# Patient Record
Sex: Male | Born: 1955 | Race: White | Hispanic: No | Marital: Married | Smoking: Never smoker
Health system: Southern US, Community
[De-identification: ages and names within clinical notes are randomized; demographics above are authoritative.]

## PROBLEM LIST (undated history)

## (undated) DIAGNOSIS — E669 Obesity, unspecified: Secondary | ICD-10-CM

## (undated) DIAGNOSIS — I10 Essential (primary) hypertension: Secondary | ICD-10-CM

## (undated) DIAGNOSIS — M1990 Unspecified osteoarthritis, unspecified site: Secondary | ICD-10-CM

## (undated) HISTORY — DX: Unspecified osteoarthritis, unspecified site: M19.90

## (undated) HISTORY — DX: Obesity, unspecified: E66.9

## (undated) HISTORY — DX: Essential (primary) hypertension: I10

---

## 2000-12-06 ENCOUNTER — Encounter: Payer: Self-pay | Admitting: Internal Medicine

## 2000-12-06 ENCOUNTER — Encounter: Admission: RE | Admit: 2000-12-06 | Discharge: 2000-12-06 | Payer: Self-pay | Admitting: Internal Medicine

## 2008-03-12 ENCOUNTER — Ambulatory Visit (HOSPITAL_COMMUNITY): Admission: RE | Admit: 2008-03-12 | Discharge: 2008-03-13 | Payer: Self-pay | Admitting: Orthopedic Surgery

## 2008-03-28 ENCOUNTER — Ambulatory Visit (HOSPITAL_COMMUNITY): Admission: RE | Admit: 2008-03-28 | Discharge: 2008-03-29 | Payer: Self-pay | Admitting: Orthopedic Surgery

## 2008-09-25 ENCOUNTER — Encounter: Admission: RE | Admit: 2008-09-25 | Discharge: 2008-09-25 | Payer: Self-pay | Admitting: Orthopedic Surgery

## 2008-10-01 ENCOUNTER — Inpatient Hospital Stay (HOSPITAL_COMMUNITY): Admission: RE | Admit: 2008-10-01 | Discharge: 2008-10-02 | Payer: Self-pay | Admitting: Orthopedic Surgery

## 2010-09-01 ENCOUNTER — Encounter: Payer: Self-pay | Admitting: Orthopedic Surgery

## 2010-11-25 LAB — CBC
HCT: 29.2 % — ABNORMAL LOW (ref 39.0–52.0)
HCT: 32.3 % — ABNORMAL LOW (ref 39.0–52.0)
Hemoglobin: 10.7 g/dL — ABNORMAL LOW (ref 13.0–17.0)
Hemoglobin: 9.7 g/dL — ABNORMAL LOW (ref 13.0–17.0)
MCHC: 33.3 g/dL (ref 30.0–36.0)
MCHC: 33.3 g/dL (ref 30.0–36.0)
MCV: 79.6 fL (ref 78.0–100.0)
MCV: 82.6 fL (ref 78.0–100.0)
Platelets: 234 10*3/uL (ref 150–400)
Platelets: 314 10*3/uL (ref 150–400)
RBC: 3.53 MIL/uL — ABNORMAL LOW (ref 4.22–5.81)
RBC: 4.06 MIL/uL — ABNORMAL LOW (ref 4.22–5.81)
RDW: 18.5 % — ABNORMAL HIGH (ref 11.5–15.5)
RDW: 18.7 % — ABNORMAL HIGH (ref 11.5–15.5)
WBC: 9 10*3/uL (ref 4.0–10.5)
WBC: 9.3 10*3/uL (ref 4.0–10.5)

## 2010-11-25 LAB — DIFFERENTIAL
Basophils Absolute: 0 10*3/uL (ref 0.0–0.1)
Basophils Relative: 0 % (ref 0–1)
Eosinophils Absolute: 0 10*3/uL (ref 0.0–0.7)
Eosinophils Relative: 0 % (ref 0–5)
Lymphocytes Relative: 23 % (ref 12–46)
Lymphs Abs: 2.1 10*3/uL (ref 0.7–4.0)
Monocytes Absolute: 0.3 10*3/uL (ref 0.1–1.0)
Monocytes Relative: 4 % (ref 3–12)
Neutro Abs: 6.8 10*3/uL (ref 1.7–7.7)
Neutrophils Relative %: 74 % (ref 43–77)

## 2010-11-25 LAB — PROTIME-INR
INR: 1 (ref 0.00–1.49)
Prothrombin Time: 13.9 s (ref 11.6–15.2)

## 2010-11-25 LAB — TYPE AND SCREEN
ABO/RH(D): A POS
Antibody Screen: NEGATIVE

## 2010-11-25 LAB — BASIC METABOLIC PANEL
BUN: 19 mg/dL (ref 6–23)
Chloride: 101 mEq/L (ref 96–112)
GFR calc Af Amer: >60 mL/min (ref >60)
GFR calc non Af Amer: >60 mL/min (ref >60)
Potassium: 4.6 mEq/L (ref 3.5–5.1)
Sodium: 135 mEq/L (ref 135–145)

## 2010-11-25 LAB — BASIC METABOLIC PANEL WITH GFR
BUN: 14 mg/dL (ref 6–23)
CO2: 28 meq/L (ref 19–32)
Calcium: 8.3 mg/dL — ABNORMAL LOW (ref 8.4–10.5)
Chloride: 101 meq/L (ref 96–112)
Creatinine, Ser: 0.78 mg/dL (ref 0.4–1.5)
GFR calc non Af Amer: >60 mL/min
Glucose, Bld: 103 mg/dL — ABNORMAL HIGH (ref 70–99)
Potassium: 4.1 meq/L (ref 3.5–5.1)
Sodium: 135 meq/L (ref 135–145)

## 2010-11-25 LAB — URINALYSIS, ROUTINE W REFLEX MICROSCOPIC
Glucose, UA: NEGATIVE mg/dL
Nitrite: NEGATIVE
Specific Gravity, Urine: 1.009 (ref 1.005–1.030)
pH: 6 (ref 5.0–8.0)

## 2010-11-25 LAB — GRAM STAIN

## 2010-12-23 NOTE — Op Note (Signed)
NAME:  Roy Lee, Roy Lee NO.:  [id number]   MEDICAL RECORD NO.:  [id number]          PATIENT TYPE:  OIB   LOCATION:  1615                         FACILITY:  [facility]   PHYSICIAN:  Madlyn Frankel. Charlann Boxer, M.D.  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   FINDINGS:  The patient was noted to have a potential space in the  lateral thigh, superficial to his deep fascia, iliotibial band and  gluteal fascia within the subcutaneous fat region.   There was no obvious purulence.  Some necrotic tissue present.   PROCEDURE:  Left hip incision and drainage with debridement and  irrigation utilizing 6 L of normal saline solution, 500 mL of bacitracin-  laden fluid.   SURGEON:  Madlyn Frankel. Charlann Boxer, M.D.   ASSISTANT:  Surgical tech.   ANESTHESIA:  Duramorph spinal.   COMPLICATIONS:  None.   DRAINS:  Times one.   INDICATION FOR THE PROCEDURE:  Mr. Arney is a 55 year old patient of mine  with a history of a total hip replacement now 16 days out, who presented  to the office on Monday with some new onset drainage just day the day  before.  No fevers, chills, night sweats.   Despite an attempt to drain a lot of this hematoma out in the office, he  came back to the office was some recurrent drainage.  I discussed with  him that if it had persistent drainage I was going to take him to the  operating room and I and D it.  Risks and benefits were discussed.  The  plan was reviewed.   PROCEDURE IN DETAIL:  The patient was brought to the operative theater.  Once adequate anesthesia was established ,the patient was positioned in  the right lateral decubitus position with  the left side up.  We  prescrubbed and prepped and draped the lateral thigh.   Antibiotics were administered, 2 grams of Ancef.  I incised the skin.  There is still a large collection of fluid inside the knee, but it did  not erupt out of the wound.  There was no evidence of any obvious  purulence.  A bit of a necrotic hue to some of the tissue fluid, but  nothing that appeared to be obviously infected.   Following the debridement and suction out of this remaining fluid, I  palpated around and felt that it extended basically under the area of  incision.  There was no other sinus tracts or undermining of the tissue.  Given these findings, I went ahead and irrigated with normal saline  solution, pulse lavaged a total of 9 L of normal saline solution.  Three  3 L bags were utilized.   Following this 500 mL of bacitracin-laden fluid were placed in the wound  and  sloshed around.   A medium Hemovac drain was placed into this cavity.  I reapproximated  with just probably four 2-0 Vicryls in the subcu layer and then used 2-0  nylon on the skin.   We cleaned the skin edges, utilized Xeroform gauze and then dressed the  wound sterilely.  He is brought to recovery room in stable condition,  tolerating the procedure well under Duramorph spinal, without  complication.      Madlyn Frankel Charlann Boxer, M.D.  Electronically Signed     MDO/MEDQ  D:  03/28/2008  T:  03/28/2008  Job:  [id number]

## 2010-12-23 NOTE — H&P (Signed)
NAME:  Roy Lee, Roy Lee NO.:  [id number]   MEDICAL RECORD NO.:  [id number]          PATIENT TYPE:  OIB   LOCATION:                               FACILITY:  [facility]   PHYSICIAN:  Madlyn Frankel. Charlann Boxer, M.D.  DATE OF BIRTH:  1955-11-14   DATE OF ADMISSION:  03/28/2008  DATE OF DISCHARGE:                              HISTORY & PHYSICAL   REASON FOR ADMISSION:  Hematoma of the left hip.   CHIEF COMPLAINT:  Left hip wound drainage.   HISTORY OF PRESENT ILLNESS:  A 55 year old male with a history of left  total hip replacement on March 12, 2008.  Since then he has been doing  well until this past Saturday he developed some persistent wound  drainage.  He was seen in our office on the 17th where we drained a  significant amount of a hematoma from his wound.  Since then he has  continued to have drainage.  He reports no fevers or chills with no  increasing pain in his left hip.   PAST MEDICAL HISTORY:  1. Osteoarthritis.  2. Hypertension.   PAST SURGICAL HISTORY:  1. Left total hip replacement.  2. Left ankle surgery.  3. Tonsillectomy.   FAMILY HISTORY:  CHF and cancer.   SOCIAL HISTORY:  Nonsmoker.  Wife is a physical therapist.   DRUG ALLERGIES:  No known drug allergies.   MEDICATIONS:  1. Lisinopril 40 mg 1 p.o. daily.  2. Celebrex 200 mg 1 p.o. daily.  3. Fish oil daily.  4. Glucosamine daily.  5. Vicodin 7.5/325 one to two p.o. q.4-6 p.r.n. pain.  6. Robaxin 500 mg p.o. q.6 p.r.n. muscle spasm pain.  7. Iron 325 mg 1 p.o. t.i.d.  The patient may use his own iron.  8. Keflex 500 mg 1 p.o. q.i.d.  He is on day #3 of #7 today.   REVIEW OF SYSTEMS:  See HPI.   PHYSICAL EXAMINATION:  Temp 99.1, pulse 72, respirations 16, blood  pressure 118/80.  GENERAL:  Awake, alert, and oriented.  NECK:  Supple.  No carotid bruits.  CHEST:  Lungs are clear to auscultation bilaterally.  BREASTS:  Deferred.  HEART:  Regular rate and rhythm.  S1, S2 distinct.  ABDOMEN:   Soft, nontender, bowel sounds present.  GENITOURINARY:  Deferred.  EXTREMITIES:  Left hip range of motion has minimal discomfort.  SKIN:  Persistent drainage of a hematoma of left hip wound.  NEUROLOGIC:  He has intact distal sensibilities.   LABORATORIES:  Labs pending admission, none ordered initially.   IMPRESSION:  Left hip wound hematoma.   PLAN OF ACTION:  A washout irrigation of his left hip wound due to  hematoma.  Risks and complications were discussed with Dr. Charlann Boxer.  He  will be admitted only as observation patient.     ______________________________  Yetta Glassman. [address], [place]      Madlyn Frankel. Charlann Boxer, M.D.  Electronically Signed    BLM/MEDQ  D:  03/28/2008  T:  03/28/2008  Job:  [id number]

## 2010-12-23 NOTE — H&P (Signed)
NAMEDEAVIN, FORST NO.:  [id number]   MEDICAL RECORD NO.:  [id number]        PATIENT TYPE:  LINP   LOCATION:                               FACILITY:  [facility]   PHYSICIAN:  Madlyn Frankel. Charlann Boxer, M.D.  DATE OF BIRTH:  05-Dec-1955   DATE OF ADMISSION:  03/12/2008  DATE OF DISCHARGE:                              HISTORY & PHYSICAL   PROCEDURE:  Left total hip arthroplasty.   CHIEF COMPLAINT:  Left hip pain.   HISTORY OF PRESENT ILLNESS:  A 55 year old male with a history of left  hip pain secondary to end-stage osteoarthritis of the hip joint.  This  has been refractory to all conservative treatment.  Has diminished  quality of life and difficulty ambulating.  He has been presurgical  assessed by Elvera Lennox, PAC.   PAST MEDICAL HISTORY:  1. Osteoarthritis.  2. Hypertension.   PAST SURGICAL HISTORY:  1. Left ankle surgery in 1980.  2. Tonsillectomy in 1965.   FAMILY HISTORY:  Cancer, congestive heart failure.   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  Primary caregiver will be his wife in the home is a physical therapist.   DRUG ALLERGIES:  No known drug allergies.   MEDICATIONS:  1. Lisinopril 40 mg one p.o. daily.  2. Celebrex 200 mg one p.o. daily.  3. Fish oil daily.  4. Glucosamine daily.   REVIEW OF SYSTEMS:  Cardiovascular: As last EKG in July and is attached  to chart.  MUSCULOSKELETAL:  Has joint pain, morning stiffness of left  hip.  Otherwise see HPI.   PHYSICAL EXAMINATION:  Pulse 72, respirations 16, blood pressure  152/108.  Height 5 feet, 10 inches, weight 295 pounds.  GENERAL:  Awake, alert and oriented, well-developed, well-nourished.  NECK: Supple.  No carotid bruits.  CHEST:  Lungs are clear to auscultation bilaterally.  BREASTS:  Deferred.  HEART:  Regular rate and rhythm.  S1-S2 distinct.  ABDOMEN:  Soft, nontender, bowel sounds present.  GENITOURINARY:  Deferred.  EXTREMITIES:  Left hip has  decreased range of motion, increased pain.  SKIN:  No cellulitis.  NEUROLOGIC:  Intact distal sensibilities.   LABORATORY DATA:  Labs, CBC showed hemoglobin 14.1, hematocrit 42.4,  white blood cell count of 7, platelets 220.  Coagulation showed his INR  to be 0.93.  PT 11.7.  Metabolic panel- his potassium was 5, sodium 139,  creatinine was 0.8, BUN 14, glucose 95.  Liver panel normal.  Total  cholesterol 163, triglycerides 70, LDL 79, HDL 69.  White cell count all  within normal limits.   EKG normal sinus rhythm.   Chest x-ray pending.  Presurgical testing depend upon necessity as  stated by anesthesia.   ASSESSMENT:  Left hip osteoarthritis.   PLAN OF ACTION:  Left total hip arthroplasty at [facility]  March 12, 2008, by surgeon Dr. Durene Romans.  Risks and complications  were discussed.   Some postoperative medications were provided including Robaxin, iron,  Colace and MiraLax.  The patient will try to utilize Coumadin for DVT  prophylaxis.  In addition, the patient would like to hopefully have just  a two day course in the hospital with discharge on postop day #2  provided he is hemodynamically and orthopedically stable.     ______________________________  Yetta Glassman. [address], [place]      Madlyn Frankel. Charlann Boxer, M.D.  Electronically Signed    BLM/MEDQ  D:  02/29/2008  T:  02/29/2008  Job:  [id number]

## 2010-12-23 NOTE — Op Note (Signed)
NAMEELIM, ECONOMOU NO.:  [id number]   MEDICAL RECORD NO.:  [id number]          PATIENT TYPE:  INP   LOCATION:  0005                         FACILITY:  [facility]   PHYSICIAN:  Madlyn Frankel. Charlann Boxer, M.D.  DATE OF BIRTH:  06/08/1956   DATE OF PROCEDURE:  03/12/2008  DATE OF DISCHARGE:                               OPERATIVE REPORT   PREOPERATIVE DIAGNOSIS:  Left hip end-stage degenerative joint disease.   POSTOPERATIVE DIAGNOSIS:  Left hip end-stage degenerative joint disease.   PROCEDURE:  Left total hip replacement.   COMPONENTS USED:  DePuy hip system, 54 ASR cup, a 7 high Tri-Lock stem  with a 47 ASR ball with +5 adapter.   SURGEON:  Madlyn Frankel. Charlann Boxer, M.D.   ASSISTANT:  Dwyane Luo.   ANESTHESIA:  Spinal.   BLOOD LOSS:  400.   DRAINS:  x1.   COMPLICATIONS:  None.   INDICATIONS FOR PROCEDURE:  Mr. Lizotte is a 55 year old male who presented  to the office over year ago with presentation of hip pain.  He was noted  at that time to have severe changes with already lower extremity  shortening perhaps due to contracture in addition to his collapse of the  loss of cartilage space.  He presented a year later with increasing  discomfort and inability to tolerate it.  Unfortunately, he was at a  point that he needed to go ahead and proceed with surgery.  We reviewed  the risks and benefits of the surgery in an attempt to try to get his  leg lengths back to normal.  We talked about the bearing surfaces and  his age.  The hope is with a metal-on-metal bearing surface  appropriately positioned then it would perhaps last him his life time.   Consent was obtained after reviewing the standard risks of infection,  DVT, component failure, need for revision, dislocation.   PROCEDURE IN DETAIL:  The patient was brought to the operative theater.  Once adequate anesthesia and preoperative antibiotics, Ancef 2 g, were  administered, the patient was positioned in the right lateral  decubitus  position left side up.  We prescrubbed the left lateral hip and prepped  and draped in a sterile fashion.  Lateral-based incision was made, and a  sharp dissection was carried to the iliotibial band and gluteal fascia.  This was sharply excised posteriorly for a posterior approach.  The  short external rotators were identified and taken down separate from the  posterior capsule.  The capsule was noticed to be significantly involved  and thus was excised, performing a capsulectomy.  I had actually  performed this at the end of case after I had reamed the acetabulum  where retractors were used to protect.  I used the capsule tissue to  protect the sciatic nerve from retractors.  Following the capsulotomy,  the hip was dislocated and noted to be severely arthritic.  A neck  osteotomy was made based off anatomic landmarks using a standard neck  length and small ball size to identify the center of the head.   Following the neck cut, I  attended to the femur first.  Using a starting  drill, I opened up the canal and irrigated to prevent fat emboli.  I  passed a single reamer once.  I began broaching with a size 1 and  carried this up to a size 6 which sat at the level of my neck cut.  I  used a calcar planer just to finish off from the anterior bone.   I used this time to debride some of the anterior capsule.  I then  removed the broach and placed a sponge to pack the femur off.  Femur was  now retracted anteriorly with retractors.  Retractors placed  posteriorly, again using the capsule to preserve and protect the sciatic  nerve.  With retractors placed, I removed the labrum as well as superior  capsule.  I began reaming with a 46 reamer and then a 48, 50 and 52 with  good bony bed preparation getting down close to the medial wall.   I marked anteriorly where I wished to have the cup positioned.  I  finished up with 53 reamer and then used 54 ASR cup.  It was impacted  with the  guide in place, indicating abduction about 40 degrees.  It was  positioned beneath the anterior wall anteriorly.  It sat at the level  where I had marked anteriorly, indicating that I would penetrate the  appropriate depth.   Please note that I did place some acetabular reamers with the medial  wall, and there was a small area over the foveal area just to fill in  the gap.   It was at this point that I removed the posterior capsule as there was  going to be no repair possible.  Attention was now directed to the trial  reduction.   Preoperatively, there was noted to be significant shortening of his left  lower extremity due to a combination of hip flexion contracture versus  cartilage disease.  This was taken into consideration when I had  positioned initially.  I did a trial reduction with 6 high offset neck  and a +2 adapter.  With this, I still felt that his leg lengths were  close to where they were preoperatively, indicating that he was not  lengthened enough.  For that reason, I removed the 6 high broach, placed  a 7 broach which sat maybe 4-5 mm proximal to the neck cut.  I did a  trial again with a +2.  Hip stability was very good.  The combined  anteversion was 45-50 degrees.  There was no evidence of impingement or  subluxation.  There was leg length issue at this point.  At this point,  I chose a 7 high offset stem.  I removed all of the trial components,  impacted the final 7 broach at about the level of where the broach was,  if not just a little bit lower.  For this reason, I trialed a +5  adapter.  Given the examination that had in the preoperative position  now, I felt that I had lengthened him back to a predetermined length  based on the position of his heels in relationship to his knees.  Given  this, I used a +5 adapter with a 47 ball.  This was impacted onto a  dried trunnion and the hip reduced.  We irrigated the hip throughout the  case and removed some fatty  tissue around the posterior aspect of the  hip as necessary to  prevent any necrotic tissue and serous drainage.  Given the capsulotomy and capsulectomy, I ended up placing a Hemovac  drain deep, reapproximated the iliotibial band and gluteal fascia with a  #1 Vicryl.  The remaining wound was closed with two 2-0 Vicryl and 4-0  running Monocryl.  Hip was cleaned, dried and dressed sterilely with  Steri-Strips and a Mepilex dressing.  He was brought to recovery room in  stable condition, tolerating the procedure well.     Madlyn Frankel Charlann Boxer, M.D.  Electronically Signed    MDO/MEDQ  D:  03/12/2008  T:  03/12/2008  Job:  [id number]

## 2010-12-23 NOTE — Op Note (Signed)
Roy Lee, FALCO NO.:  [id number]   MEDICAL RECORD NO.:  [id number]          PATIENT TYPE:  INP   LOCATION:  0004                         FACILITY:  [facility]   PHYSICIAN:  Madlyn Frankel. Charlann Boxer, M.D.  DATE OF BIRTH:  [birth year]   DATE OF PROCEDURE:  10/01/2008  DATE OF DISCHARGE:                               OPERATIVE REPORT   PREOPERATIVE DIAGNOSIS:  Infected left total hip replacement.   POSTOPERATIVE DIAGNOSIS:  Infected left total hip replacement.   PROCEDURE:  Incision and drainage of left hip with extensive  debridement, including revision of acetabular surgery with removal of a  previously placed ASR component with placement of a size 56 Pinnacle cup  with a 40-mm neutral liner and a 40+ 1.5 ball.   SURGEON:  Madlyn Frankel. Charlann Boxer, M.D.   ASSISTANT:  Yetta Glassman. Mann, PA.   ANESTHESIA:  General.   SPECIMENS:  I did send tissue samples off for evaluation; however, the  cultures were returned back to [place] as negative with few white blood cells  present.   BLOOD LOSS:  1100 cc.   DRAINS:  One Hemovac.   COMPLICATIONS:  None.   INDICATIONS FOR PROCEDURE:  Mr. Heinze is a 55 year old gentleman with a  history of a left total hip replacement in August, 2009.  This was  complicated by a postoperative hematoma requiring I and D 2 weeks out.  He was placed on oral antibiotics until his wound healed.  He had a  period of time where he had done very well with improvement in his pain  other than working with physical therapy and working on his range of  motion and strength.  He recently about 6 weeks ago developed the onset  of acute onset of pain, progressive pain requiring medications to help  stabilize.  He was seen in the office and subsequently set up for an  aspiration of the hip joint which revealed Staphylococcus aureus.   At this point, I reviewed with him options, given his current social and  work situations.  We decided to attempt at debridement and  acetabular  revision with IV antibiotics and long-term suppressive care.  The risks  of recurrent infection, DVT, component failure, and need for revision  and surgery were all discussed and reviewed.  Consent was obtained.   PROCEDURE IN DETAIL:  Patient was brought to the operating theater.  Once adequate anesthesia and preoperative antibiotics, which to this  point I gave his vancomycin.  He did have findings on his initial  cultures of MRSA.  After giving this, the patient was positioned to the  right lateral decubitus position with the left side up.  The left lower  extremity was prescrubbed and prepped and draped in a sterile fashion.   The time-out was performed, identifying the patient, planned procedure,  and extremity.  I basically excised the entire old excision extended  more distal and proximal to allow for exposure.  There was an area in  the mid portion of the incision that had started to appear inflammed  with swelling like a boil.  I  basically followed this out and lifted out  this entire tissue, including a portion of the iliotibial band and  continued to follow this down into the inferior aspect of the hip joint  region.  I exposed the hip extensively, debriding the tremendous amount  of scar tissue. Basically, by the time that my debridement was carried  out, I had performed a complete capsulectomy.  I then dislocated the  femoral head and removed it.  I then elevated the gluteus tissue off of  the ileum and placed the trunnion.  Further debridement was carried out,  including the anterior capsule with the exposure and visualization of  the iliopsoas tendon.   At this point, I used a curved Moreland osteotome to remove the  acetabular component without difficulty.  There was no bone loss.  At  this point, I irrigated the acetabulum out with 6 liters of normal  saline solution, including all of the soft tissues in this area.  I had  reamed in this area prior to  irrigating with a 55 reamer with good bony  bed preparation.  I debrided any kind of fibrinous material out from  this area.  Following this irrigation with 6 liters and the reaming  preparation, I impacted a 56-mm Pinnacle cup.  I initially set it into  its correct orientation in the size of his hip joint using my hip guide  as a guide and then impacted it in with a very large head.  It had good  initial scratch fit.  I subsequently used 2 screws to support this  initial fixation with good bite.  A trial liner was placed.   A trial reduction was now carried out with a 40-mm bite.  I had removed  a +5 adaptor on the ASR component, which shows the use of +1.5 here with  the hip stability to be normal, leg length normal, no subluxation.  Combined anteversion was 45 degrees.   Once this was done, I spent a little bit more time debriding around the  proximal femur, irrigating it out.  I irrigated with another 3 liters of  normal saline solution.  I also used 500 cc of bacitracin fluid at this  point.  The trial liner was removed.  The final 40-mm neutral metal  liner was then impacted and with a good rim fit.   Following final debridements, I went ahead and impacted the 40+ 1.5  metal ball onto the trunnion and reduced the hip.  We irrigated the hip  again with 3 liters of normal saline solution.   Following this, I placed a medium Hemovac drain deep and reapproximated  the iliotibial band and gluteal fascia using a #1 PDS with 2-0 Vicryl in  the subcu layer with staples on the skin.  The skin was clean, dry, and  dressed sterilely with Xeroform and a bulky sterile wrap.  He was  brought to the recovery room and extubated in stable condition.  He did  require 2 units of blood inside the OR, based on the fact that he had a  blood loss of 1100 and a starting hematocrit of 30.      Madlyn Frankel Charlann Boxer, M.D.  Electronically Signed     MDO/MEDQ  D:  10/01/2008  T:  10/02/2008  Job:   [id number]

## 2011-05-08 LAB — PROTIME-INR: Prothrombin Time: 13.4

## 2011-05-08 LAB — BASIC METABOLIC PANEL
BUN: 14
Calcium: 10
Chloride: 104
Creatinine, Ser: 0.76
GFR calc Af Amer: >60
GFR calc non Af Amer: >60
Glucose, Bld: 115 — ABNORMAL HIGH
Potassium: 4.3

## 2011-05-08 LAB — URINALYSIS, ROUTINE W REFLEX MICROSCOPIC
Glucose, UA: NEGATIVE
Ketones, ur: NEGATIVE
Protein, ur: NEGATIVE

## 2011-05-08 LAB — TYPE AND SCREEN

## 2011-05-08 LAB — CBC
HCT: 43.4
HCT: 34.1 — ABNORMAL LOW
Hemoglobin: 11.6 — ABNORMAL LOW
MCV: 88.7
Platelets: 211
RBC: 3.84 — ABNORMAL LOW
RDW: 14.4
WBC: 10.4

## 2011-05-08 LAB — DIFFERENTIAL
Basophils Absolute: 0
Eosinophils Relative: 3
Lymphocytes Relative: 31
Neutro Abs: 4.6
Neutrophils Relative %: 59

## 2011-05-25 ENCOUNTER — Other Ambulatory Visit: Payer: Self-pay | Admitting: Orthopedic Surgery

## 2011-05-25 ENCOUNTER — Encounter (HOSPITAL_COMMUNITY): Payer: PRIVATE HEALTH INSURANCE

## 2011-05-25 ENCOUNTER — Other Ambulatory Visit (HOSPITAL_COMMUNITY): Payer: Self-pay | Admitting: Orthopedic Surgery

## 2011-05-25 ENCOUNTER — Ambulatory Visit (HOSPITAL_COMMUNITY)
Admission: RE | Admit: 2011-05-25 | Discharge: 2011-05-25 | Disposition: A | Discharge status: Home / Self Care | Payer: PRIVATE HEALTH INSURANCE | Source: Ambulatory Visit | Attending: Orthopedic Surgery | Admitting: Orthopedic Surgery

## 2011-05-25 DIAGNOSIS — Z01818 Encounter for other preprocedural examination: Secondary | ICD-10-CM

## 2011-05-25 DIAGNOSIS — Z01812 Encounter for preprocedural laboratory examination: Secondary | ICD-10-CM | POA: Insufficient documentation

## 2011-05-25 LAB — URINALYSIS, ROUTINE W REFLEX MICROSCOPIC
Bilirubin Urine: NEGATIVE
Ketones, ur: NEGATIVE mg/dL
Leukocytes, UA: NEGATIVE
Nitrite: NEGATIVE
Protein, ur: NEGATIVE mg/dL
Urobilinogen, UA: 0.2 mg/dL (ref 0.0–1.0)

## 2011-05-25 LAB — CBC
MCH: 29.7 pg (ref 26.0–34.0)
MCHC: 35 g/dL (ref 30.0–36.0)
Platelets: 218 10*3/uL (ref 150–400)
RBC: 4.85 MIL/uL (ref 4.22–5.81)
RDW: 14.1 % (ref 11.5–15.5)

## 2011-05-25 LAB — BASIC METABOLIC PANEL
CO2: 27 mEq/L (ref 19–32)
Calcium: 10.2 mg/dL (ref 8.4–10.5)
Chloride: 97 mEq/L (ref 96–112)
Creatinine, Ser: 0.77 mg/dL (ref 0.50–1.35)
Glucose, Bld: 99 mg/dL (ref 70–99)

## 2011-05-25 LAB — DIFFERENTIAL
Lymphocytes Relative: 27 % (ref 12–46)
Monocytes Absolute: 0.6 10*3/uL (ref 0.1–1.0)
Monocytes Relative: 6 % (ref 3–12)
Neutro Abs: 6.2 10*3/uL (ref 1.7–7.7)

## 2011-05-26 ENCOUNTER — Inpatient Hospital Stay (HOSPITAL_COMMUNITY): Payer: PRIVATE HEALTH INSURANCE

## 2011-05-26 ENCOUNTER — Inpatient Hospital Stay (HOSPITAL_COMMUNITY)
Admission: RE | Admit: 2011-05-26 | Discharge: 2011-05-27 | DRG: 470 | Disposition: A | Discharge status: Home Health | Payer: PRIVATE HEALTH INSURANCE | Source: Ambulatory Visit | Attending: Orthopedic Surgery | Admitting: Orthopedic Surgery

## 2011-05-26 DIAGNOSIS — H9190 Unspecified hearing loss, unspecified ear: Secondary | ICD-10-CM | POA: Diagnosis present

## 2011-05-26 DIAGNOSIS — Z01818 Encounter for other preprocedural examination: Secondary | ICD-10-CM

## 2011-05-26 DIAGNOSIS — Z01812 Encounter for preprocedural laboratory examination: Secondary | ICD-10-CM

## 2011-05-26 DIAGNOSIS — M1610 Unilateral primary osteoarthritis, unspecified hip: Principal | ICD-10-CM | POA: Diagnosis present

## 2011-05-26 DIAGNOSIS — I10 Essential (primary) hypertension: Secondary | ICD-10-CM | POA: Diagnosis present

## 2011-05-26 DIAGNOSIS — Z7382 Dual sensory impairment: Secondary | ICD-10-CM

## 2011-05-26 DIAGNOSIS — M169 Osteoarthritis of hip, unspecified: Principal | ICD-10-CM | POA: Diagnosis present

## 2011-05-26 DIAGNOSIS — M129 Arthropathy, unspecified: Secondary | ICD-10-CM | POA: Diagnosis present

## 2011-05-26 DIAGNOSIS — Z96649 Presence of unspecified artificial hip joint: Secondary | ICD-10-CM

## 2011-05-26 DIAGNOSIS — G4730 Sleep apnea, unspecified: Secondary | ICD-10-CM | POA: Diagnosis present

## 2011-05-26 DIAGNOSIS — H547 Unspecified visual loss: Secondary | ICD-10-CM | POA: Diagnosis present

## 2011-05-26 LAB — TYPE AND SCREEN: Antibody Screen: NEGATIVE

## 2011-05-27 LAB — BASIC METABOLIC PANEL
Chloride: 100 mEq/L (ref 96–112)
Creatinine, Ser: 0.65 mg/dL (ref 0.50–1.35)
GFR calc Af Amer: >90 mL/min (ref >90)
GFR calc non Af Amer: >90 mL/min (ref >90)

## 2011-05-27 LAB — CBC
MCHC: 34.5 g/dL (ref 30.0–36.0)
MCV: 84.3 fL (ref 78.0–100.0)
Platelets: 168 10*3/uL (ref 150–400)
RDW: 13.9 % (ref 11.5–15.5)
WBC: 7.7 10*3/uL (ref 4.0–10.5)

## 2011-06-01 NOTE — Op Note (Signed)
  NAMEISSACHAR, BROADY NO.:  [id number]  MEDICAL RECORD NO.:  [id number]  LOCATION:  0005                         FACILITY:  [facility]  PHYSICIAN:  Madlyn Frankel. Charlann Boxer, M.D.  DATE OF BIRTH:  1955-11-27  DATE OF PROCEDURE:  05/26/2011 DATE OF DISCHARGE:                              OPERATIVE REPORT   ADDENDUM:  Please note that physician assistant, Lanney Gins, was present for the entirety of the case from preoperative positioning to perioperative retractor management, and general facilitation of the case as well as primary wound closure.     Madlyn Frankel Charlann Boxer, M.D.     MDO/MEDQ  D:  05/26/2011  T:  05/26/2011  Job:  [id number]  Electronically Signed by Durene Romans M.D. on 06/01/2011 12:38:14 PM

## 2011-06-01 NOTE — H&P (Signed)
NAMEJEREMIAH, Roy Lee NO.:  [id number]  MEDICAL RECORD NO.:  [id number]  LOCATION:  1S                           FACILITY:  [facility]  PHYSICIAN:  Madlyn Frankel. Charlann Boxer, M.D.  DATE OF BIRTH:  August 01, 1956  DATE OF ADMISSION:  05/01/2011 DATE OF DISCHARGE:                             HISTORY & PHYSICAL   DATE OF SURGERY:  May 26, 2011.  CHIEF COMPLAINT:  Right hip osteoarthritis.  HISTORY OF PRESENT ILLNESS:  The patient is a 55 year old white male in no acute distress.  Patient states that he has had a significant right hip pain for over a year.  It has been increasing a lot recently and very rapidly.  Patient does state he is having to limp wherever he goes and having to use a cane to help himself get around.  Patient has had anti-inflammatories, they worked more at first and have been less effective.  Patient has had x-rays that show bone-on-bone arthritic changes.  Various options were discussed with the patient.  Patient wishes to proceed with surgery.  Risks, benefits, and expectation of procedure discussed with the patient.  Patient understands risks, benefits, and expectations, and wishes to proceed with the surgery.  Patient is planning on being discharged home after the hospital stay. Patient has been given his postoperative medications including aspirin, Robaxin, iron, Colace, and MiraLax.  Patient is a candidate for tranexamic acid and will be given this prior to surgery.  PRIMARY CARE PHYSICIAN:  Laurell Josephs, [place].  PAST MEDICAL HISTORY:  Includes: 1. Impaired vision. 2. Impair hearing. 3. High blood pressure. 4. Sleep apnea. 5. Arthritis. 6. Previous staph infection.  PAST SURGERIES:  Include: 1. Tonsillectomy. 2. Left ankle surgery. 3. Left hip replacement. 4. Left hip wound care. 5. Left hip revision.  MEDICATIONS: 1. Ibuprofen 800 mg t.i.d. (stop 5 days prior to surgery). 2. Doxycycline 100 mg one p.o. b.i.d. 3. Lisinopril 40 mg one  p.o. daily. 4. HCTZ 12.5 mg one p.o. daily. 5. Furosemide 20 mg one p.o. daily. 6. Aspirin 81 mg one p.o. daily (stop 5 days prior surgery). 7. Vitamin C 1000 mg x3 (stop 5 days prior to surgery). 8. Calcium 600 mg daily ( stop 5 days prior to surgery). 9. Triple omega four a day ( stop 5 days prior to surgery). 10.Vitamin E 400 international units daily (stop 5 days prior to     surgery). 11.Prostate Health complex two per day (stop 5 days prior to surgery).  ALLERGIES:  No known drug allergies.  SOCIAL HISTORY:  Patient denies the use of tobacco.  The patient does admit to drinking one drink per week.  REVIEW OF SYSTEMS:  HEENT: Hearing loss. MUSCULOSKELETAL:  Joint pain, joint swelling, muscular pain, morning stiffness, and muscular weakness, otherwise unremarkable.  PHYSICAL EXAMINATION:  GENERAL:  The patient is a 55 year old white male, in no acute distress. VITAL SIGNS:  Stable.  Blood pressure is 132/84, respirations 18, pulse 90. HEENT:  Pupils are equal, round, and reactive to light and accommodation.  Throat is clear. NECK:  Supple.  No JVD noted.  No carotid bruits noted.  No lymphadenopathy noted. CARDIAC:  Normal-appearing S1 and S2.  No  murmur appreciated. RESPIRATORY:  Lungs clear to auscultation bilaterally. NEURO:  The patient is oriented x3. ORTHO:  Pertaining to the right hip.  Patient does have some pain on palpation of the lateral aspect of the hip.  Patient has 0 degrees of external rotation which is very painful with any attempts.  Patient has good internal rotation.  Patient is distally neurovascularly intact.  IMPRESSION:  Right hip osteoarthritis.  STUDIES:  X-rays as above.  PLAN:  Patient will be admitted to hospital to undergo right total hip arthroplasty with an anterior approach per Dr. Charlann Boxer at [facility] on May 26, 2011.  Risks, benefits, and expectation of procedure were discussed with the patient.  Patient understands  risks, benefits, and expectations, and wishes to proceed with surgery.    ______________________________ Lanney Gins, PA   ______________________________ Madlyn Frankel. Charlann Boxer, M.D.    MB/MEDQ  D:  05/13/2011  T:  05/14/2011  Job:  [id number]  Electronically Signed by Lanney Gins PA on 05/26/2011 08:30:46 AM Electronically Signed by Durene Romans M.D. on 06/01/2011 12:37:45 PM

## 2011-06-01 NOTE — Op Note (Signed)
Roy Lee, Roy Lee NO.:  [id number]  MEDICAL RECORD NO.:  [id number]  LOCATION:  1617                         FACILITY:  [facility]  PHYSICIAN:  Madlyn Frankel. Charlann Boxer, M.D.  DATE OF BIRTH:  05/26/2009  DATE OF PROCEDURE: DATE OF DISCHARGE:                              OPERATIVE REPORT   PREOPERATIVE DIAGNOSIS:  Right hip osteoarthritis.  POSTOPERATIVE DIAGNOSIS:  Right hip osteoarthritis.  PROCEDURES:  Right total hip replacement using DePuy components, of size 56 pinnacle shell, 36+ 4 neutral Ultrex liner, and a size 5 standard Tri- Lock stem with a 36+ 1.5 delta ceramic ball.  SURGEON:  Madlyn Frankel. Charlann Boxer, M.D.  ASSISTANT:  Lanney Gins, [facility]  ANESTHESIA:  General.  SPECIMENS:  None.  COMPLICATION:  None.  DRAINS:  One Hemovac.  The patient was stable in recovery room.  INDICATION FOR PROCEDURE:  Roy Lee is a 55 year old patient of mine, who presented recently with progressive right hip pain.  Radiographs revealed significant deterioration of his knees compared to previous films, with bone-on-bone articulation.  After reviewing with him his significant reduction of quality of life regarding this right hip pain, he wished at this point to proceed with hip arthroplasty.  We discussed the differences between his left hip and the complication he had dealt with on that side including infection and conversion from previous surgery to a metal-on-metal modular system through an anterior approach.  Specific risks of infection, DVT, component failure, and dislocation were all discussed and reviewed in the setting of a total hip through an anterior approach.  Consent was obtained for the above.  PROCEDURE IN DETAIL:  The patient was brought to the operative theater. Once adequate anesthesia, preop antibiotics, Ancef administered 2 g, the patient was positioned supine on the OSI Hana table.  Following predraping and use of fluoroscopy to confirm landmarks and  positioning, the right hip from the proximal iliac crest to the mid thigh was prepped and draped in a sterile fashion using shower curtain technique.  Time-out was performed identifying the patient, planned procedure, and extremity.  At this point, an incision was made 2 cm distal and lateral to the anterior superior iliac spine, this extended in line with the tensor fascia lata muscle.  Sharp dissection was carried to the fascia of this muscle belly.  Following soft tissue exposure, protractor was placed. The fascia was then incised, elevated, and the muscle belly swept laterally, and a retractor placed along the superior neck.  Following the cauterization of the circumflex vessels as well as removal of pericapsular fat, the second retractor was placed in the inferior neck and one over the anterior rim of the acetabulum.  An L capsulotomy was then made over the superior neck extending to the trochanteric fossa proximally and then towards the lesser trochanter distally.  Stay sutures were placed and retractors were placed intracapsular.  At this point, I identified the anatomic landmarks and orientation of the neck cut, and then under fluoroscopic imaging, confirmed the location of this cut compared to the contralateral hip.  Retraction applied on the hip.  Neck osteotomy was made in the femoral head removed without difficulty or complication.  At this point, traction  was taken off the hip.  Retractors were placed along the anterior rim and one posteriorly.  At this point, I debrided osteophytes anteriorly and some posterior remaining labrum.  Foveal tissue was debrided.  I began reaming with a 48 reamer and reamed up to 55 reamer.  The last 2 reamings were done under fluoroscopic imaging to confirm orientation and depth of reaming.  At this point, I felt I had an adequate bony bed and did not want to ream further.  A 56 pinnacle cup was chosen and a straight impactor was impacted  under fluoroscopic guidance.  I extended the knee to about 35-40 degrees of abduction and then positioned anatomically within the pelvis.  A single cancellous screw was placed, a hole eliminator placed and final 36+ 4 neutral Ultrex liner.  At this point, the lateral hook was placed in the femur.  The femur was elevated and held in position with a table hook device on the table.  The labrum was externally rotated to 100 degrees initially.  I removed the capsular tissue along the inferior neck to help with rotation and positioning.  His leg was then rotated back to neutral and the superior capsule was further opened to allow for exposure of the posterior trochanteric fossa and recompare them.  Following further debridements of posterior soft tissues, the retractors were placed medial in the legs, and extended and abducted without tension.  Another retractor was placed along the posterior trochanter.  At this point, with the proximal femur exposed, the box osteotome was used to open the proximal femur.  I then placed by hand the starting broach.  I then began broaching with 0 broach.  I ended up broaching up to a size 3.  At this point, I did fluoroscopic imaging, we confirmed the orientation in the AP and lateral planes.  Lateral plan in particular, but also in AP view, identifying that we would not probably make it up to a size 7 to match the contralateral hip, but instead if I get smaller, applied it with the 5 and that offset was going to be appropriate with a standard component.  At this point, the trial 3 broach was removed.  I broached up to a size 5 broach, used a standard neck and did a trial reduction with 36+ 1.5 ball.  With this, again confirming that the AP position of the component was adequate, with good bone fixation medially and laterally.  With an AP pelvis check, I thought leg lengths at this point, were exactly identical to the contralateral hip, loading lesser  trochanters to the ischium.  At this point, the trial components were removed.  The hip was dislocated and retractor was placed.  The final 5 standard Tri-Lock stem was chosen.  Following the irrigation, the 5 standard stem was impacted and sat at the level where the broach was and based on this and the trial reduction, a 36+ 1.5 delta ceramic ball was chosen and impacted on the clean and dry trunnion.  At this point, the hip was irrigated as it had been throughout the case.  I reapproximated the anterior capsule using #1 Vicryl.  The remaining of the wound was, at this point, closed over a medium Hemovac drain using #1 Vicryl on the fascia with tensor fascia muscle.  The remaining of the wound was closed with 2-0 Vicryl and running 4-0 Monocryl.  The hip was cleaned, dried, and dressed sterilely using Dermabond and Aquacel dressing.  Drain site dressed separately.  The patient was then brought to recovery room in stable condition tolerating the procedure well.     Madlyn Frankel Charlann Boxer, M.D.     MDO/MEDQ  D:  05/26/2011  T:  05/26/2011  Job:  [id number]  Electronically Signed by Durene Romans M.D. on 06/01/2011 12:37:52 PM

## 2011-06-11 NOTE — Discharge Summary (Signed)
Roy Lee, Roy Lee NO.:  [id number]  MEDICAL RECORD NO.:  [id number]  LOCATION:  1617                         FACILITY:  [facility]  PHYSICIAN:  Madlyn Frankel. Charlann Boxer, M.D.  DATE OF BIRTH:  April 07, 1956  DATE OF ADMISSION:  05/26/2011 DATE OF DISCHARGE:  05/27/2011                              DISCHARGE SUMMARY   PROCEDURE:  Right total hip arthroplasty, anterior approach.  ATTENDING PHYSICIAN:  Madlyn Frankel. Charlann Boxer, M.D.  ADMITTING DIAGNOSIS:  Right hip osteoarthritis.  DISCHARGE DIAGNOSES: 1. Status post right total hip arthroplasty, anterior approach. 2. Impaired vision. 3. Impaired hearing. 4. High blood pressure. 5. Sleep apnea. 6. Arthritis. 7. Previous Staphylococcus infection.  HISTORY OF PRESENT ILLNESS:  The patient is a 55 year old white male in no acute distress.  The patient states that he has significant right hip pain for over a year.  It has been increasing a lot recently and veryrapidly.  The patient does state that he is having a limp whenever he does walk and having to use a cane to help himself to get around.  The patient has tried anti-inflammatories, which worked at first and then it was less effective.  The patient has had x-rays that show bone-on-bone arthritic changes.  Various options were discussed with the patient. The patient wished to proceed with surgery.  Risks, benefits, and expectations are discussed with the patient.  The patient understands risks, benefits, and expectations and wishes to proceed with surgery.  HOSPITAL COURSE:  The patient underwent the above-stated procedure on June 05, 2011.  The patient tolerated the procedure well, was brought to the recovery room in good condition, and subsequently to the floor.  Postop day 1, May 27, 2011, the patient doing really well. Neurologic exam, his pain was well controlled.  He is afebrile.  Vital signs stable.  H and H is 10.7/30.7.  Dressing is clean, dry, and intact.  He  is distally neurovascular intact.  Patient with physical therapy x2.  Hemovac was removed.  The patient is felt to be doing well enough to be discharged home on home health PT after having physical therapy x2.  DISCHARGE CONDITION:  Good.  DISCHARGE INSTRUCTIONS:  The patient will be discharged home with home health PT after having physical therapy in the hospital.  The patient will be weightbearing as tolerated.  The patient maintain his surgical dressing for about 8 days after which time he will replace with gauze and tape.  The patient keep the area dry and clean until followup.  The patient will follow up in 2 weeks at [facility].  The patient is to call with any questions or concerns.  DISCHARGE MEDICATIONS: 1. Enteric-coated aspirin 325 mg 1 p.o. b.i.d. for 4 weeks. 2. Benadryl 25 mg 1 p.o. q.4 hours p.r.n. 3. Colace 100 mg 1 p.o. b.i.d. constipation. 4. Iron sulfate 325 mg 1 p.o. t.i.d. for 2 to 3 weeks. 5. Norco 7.5/325 one to two p.o. q.4-6 hours p.r.n. pain. 6. Robaxin 500 mg 1 p.o. q.6 hours p.r.n. muscle spasms. 7. MiraLax 17 g p.o. q. day, constipation. 8. Calcium citrate/vitamin D 1 p.o. q. day. 9. Doxycycline 100 mg 1 p.o. b.i.d. 10.Fish oil/flaxseed oil/sunflower  seed oil 4 capsules daily. 11.Hydrochlorothiazide 12.5 mg 1 p.o. q.a.m. 12.Lasix 20 mg 1 p.o. q.a.m. 13.Lisinopril 40 mg 1 p.o. q.a.m. 14.Vitamin C 1000 mg 3 p.o. q. day. 15.Vitamin E 400 units 2 p.o. q. day.    ______________________________ Lanney Gins, PA   ______________________________ Madlyn Frankel. Charlann Boxer, M.D.    MB/MEDQ  D:  05/27/2011  T:  05/27/2011  Job:  [id number]  cc:   Laurell Josephs, PA Fax: [phone]  Electronically Signed by Lanney Gins PA on 06/09/2011 02:14:16 PM Electronically Signed by Durene Romans M.D. on 06/11/2011 11:02:18 AM

## 2015-08-13 MED FILL — LATANOPROST 0.005% EYE DRP: 0.005 | 25 days supply | Qty: 3 | Fill #1

## 2015-08-13 MED FILL — AMOXICILLIN 500 MG CAPSULE: 500 | 2 days supply | Qty: 8 | Fill #0

## 2015-08-23 DIAGNOSIS — H35412 Lattice degeneration of retina, left eye: Secondary | ICD-10-CM | POA: Diagnosis not present

## 2015-08-23 DIAGNOSIS — H401111 Primary open-angle glaucoma, right eye, mild stage: Secondary | ICD-10-CM | POA: Diagnosis not present

## 2015-08-23 DIAGNOSIS — H401122 Primary open-angle glaucoma, left eye, moderate stage: Secondary | ICD-10-CM | POA: Diagnosis not present

## 2015-09-04 MED FILL — HYDROCHLOROTHIAZIDE 12.5 MG: 12.5 | 90 days supply | Qty: 90 | Fill #1

## 2015-09-04 MED FILL — LISINOPRIL 40 MG TABLET: 40 | 90 days supply | Qty: 90 | Fill #1

## 2015-09-22 MED FILL — LATANOPROST 0.005% EYE DRP: 0.005 | 25 days supply | Qty: 3 | Fill #2

## 2015-10-10 MED FILL — FUROSEMIDE 20 MG TABLET: 20 | 30 days supply | Qty: 60 | Fill #1

## 2015-10-28 MED FILL — LATANOPROST 0.005% EYE DRP: 0.005 | 25 days supply | Qty: 3 | Fill #3

## 2015-11-13 DIAGNOSIS — I10 Essential (primary) hypertension: Secondary | ICD-10-CM | POA: Diagnosis not present

## 2015-11-13 DIAGNOSIS — Z8739 Personal history of other diseases of the musculoskeletal system and connective tissue: Secondary | ICD-10-CM | POA: Diagnosis not present

## 2015-11-13 DIAGNOSIS — E559 Vitamin D deficiency, unspecified: Secondary | ICD-10-CM | POA: Diagnosis not present

## 2015-11-13 DIAGNOSIS — R600 Localized edema: Secondary | ICD-10-CM | POA: Diagnosis not present

## 2015-11-13 MED FILL — AMLODIPINE BESYLATE 5 MG TA: 5 | 90 days supply | Qty: 90 | Fill #0

## 2015-11-13 MED FILL — DOXYCYCLINE HYCLATE 100 MG: 100 | 30 days supply | Qty: 30 | Fill #0

## 2015-11-13 MED FILL — FUROSEMIDE 20 MG TABLET: 20 | 45 days supply | Qty: 90 | Fill #0

## 2015-11-13 MED FILL — HYDROCHLOROTHIAZIDE 12.5 MG: 12.5 | 90 days supply | Qty: 90 | Fill #0

## 2015-11-18 DIAGNOSIS — R7301 Impaired fasting glucose: Secondary | ICD-10-CM | POA: Diagnosis not present

## 2015-12-02 MED FILL — LATANOPROST 0.005% EYE DRP: 0.005 | 25 days supply | Qty: 3 | Fill #4

## 2015-12-18 ENCOUNTER — Encounter: Payer: Self-pay | Admitting: Internal Medicine

## 2015-12-18 ENCOUNTER — Ambulatory Visit (INDEPENDENT_AMBULATORY_CARE_PROVIDER_SITE_OTHER): Payer: 59 | Admitting: Internal Medicine

## 2015-12-18 VITALS — BP 131/97 | HR 69 | Temp 98.5°F | Ht 71.0 in | Wt 286.0 lb

## 2015-12-18 DIAGNOSIS — T8452XA Infection and inflammatory reaction due to internal left hip prosthesis, initial encounter: Secondary | ICD-10-CM

## 2015-12-18 DIAGNOSIS — Z8614 Personal history of Methicillin resistant Staphylococcus aureus infection: Secondary | ICD-10-CM

## 2015-12-18 NOTE — Progress Notes (Signed)
RFV:  Subjective:    Patient ID: Roy Lee, male    DOB: 1956-03-30, 60 y.o.   MRN: [id number]  HPI  Mr inge is a 60yo M with hx of HTN, obesity, OA, multiple joint replacements. He initially had left THA in 2009 that was complicated by poorly healing incisional wound roughly 3 wks after his surgery. Cultures at that time were negative, and he does not recall if he was taking abtx at that time. 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 He underwent I x D, revision with 6 wk of IV vancomycin then placed on chronic doxycycline. He underwent right hip replacement on Oct 2012, while on doxycycline which was uncomplicated. He then saw ID at [facility] as a pre-operative consultation to whether he should continue on doxy for his upcoming left ankle joint replacement in January 2013. Decision at that time was to continue doxycycline indefinitely. The patient had been taking doxycycline  BID up until when market increased prices thus decreased it to daily dosing - roughly 5-6 years ago for which he still takes doxycycline  daily. He was referred to the [facility] to determine when he could stop taking abtx. The patient has not had any further complications with any of his implants even after he was treated since 2010.   He denies any pain to his replaced joints. He does has some baseline osteoarthritis.   No Known Allergies Current Outpatient Prescriptions on File Prior to Visit  Medication Sig Dispense Refill  . aspirin 81 MG tablet Take 81 mg by mouth daily.    . cholecalciferol (VITAMIN D) 1000 UNITS tablet Take 1,000 Units by mouth daily.    Marland Kitchen doxycycline (DORYX) 100 MG EC tablet Take 100 mg by mouth 2 (two) times daily.    . furosemide (LASIX) 20 MG tablet Take 20 mg by mouth.    . hydrochlorothiazide (MICROZIDE) 12.5 MG capsule Take 12.5 mg by mouth daily.    Marland Kitchen lisinopril (PRINIVIL,ZESTRIL) 40  MG tablet Take 40 mg by mouth daily.    . Omega-3 Fatty Acids (FISH OIL) 1000 MG CAPS Take by mouth.    . vitamin C (ASCORBIC ACID) 500 MG tablet Take 500 mg by mouth daily.    . vitamin E 100 UNIT capsule Take by mouth daily.     No current facility-administered medications on file prior to visit.   Active Ambulatory Problems    Diagnosis Date Noted  . No Active Ambulatory Problems   Resolved Ambulatory Problems    Diagnosis Date Noted  . No Resolved Ambulatory Problems   Past Medical History  Diagnosis Date  . Hypertension   . Obesity   . Arthritis    Social History  Substance Use Topics  . Smoking status: Never Smoker   . Smokeless tobacco: Never Used  . Alcohol Use: 0.0 oz/week    0 Standard drinks or equivalent per week     Comment: very little  family history is not on file.  Review of Systems   Constitutional: Negative for fever, chills, diaphoresis, activity change, appetite change, fatigue and unexpected weight change.  HENT: Negative for congestion, sore throat, rhinorrhea, sneezing, trouble swallowing and sinus pressure.  Eyes: Negative for photophobia and visual disturbance.  Respiratory: Negative for cough, chest tightness, shortness of breath, wheezing and stridor.  Cardiovascular: Negative for chest pain, palpitations and leg swelling.  Gastrointestinal: Negative for nausea,  vomiting, abdominal pain, diarrhea, constipation, blood in stool, abdominal distention and anal bleeding.  Genitourinary: Negative for dysuria, hematuria, flank pain and difficulty urinating.  Musculoskeletal: Negative for myalgias, back pain, joint swelling, arthralgias and gait problem.  Skin: Negative for color change, pallor, rash and wound.  Neurological: Negative for dizziness, tremors, weakness and light-headedness.  Hematological: Negative for adenopathy. Does not bruise/bleed easily.  Psychiatric/Behavioral: Negative for behavioral problems, confusion, sleep disturbance,  dysphoric mood, decreased concentration and agitation.       Objective:   Physical Exam BP 131/97 mmHg  Pulse 69  Temp(Src) 98.5 F (36.9 C) (Oral)  Ht 5\' 11"  (1.803 m)  Wt 286 lb (129.729 kg)  BMI 39.91 kg/m2 Physical Exam  Constitutional: He is oriented to person, place, and time. He appears well-developed and well-nourished. No distress.  HENT:  Mouth/Throat: Oropharynx is clear and moist. No oropharyngeal exudate.  Cardiovascular: Normal rate, regular rhythm and normal heart sounds. Exam reveals no gallop and no friction rub.  No murmur heard.  Pulmonary/Chest: Effort normal and breath sounds normal. No respiratory distress. He has no wheezes.   Skin: Skin is warm and dry. No rash noted. No erythema.  Psychiatric: He has a normal mood and affect. His behavior is normal.       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 I believe it would be safe for him to stop his doxycycline at this time and watch for any symptoms that suggest for inflammation or infection. He really has only one joint involvement and was placed on chronic suppression as a precaution.  I have spent 25min review his records and counseling patient on mrsa joint infections  Cc: matt olin and dorothy scifres

## 2016-01-04 MED FILL — LATANOPROST 0.005% EYE DRP: 0.005 | 25 days supply | Qty: 3 | Fill #5

## 2016-01-13 MED FILL — LISINOPRIL 40 MG TABLET: 40 | 90 days supply | Qty: 90 | Fill #0

## 2016-02-14 MED FILL — LATANOPROST 0.005% EYE DRP: 0.005 | 25 days supply | Qty: 3 | Fill #6

## 2016-03-08 MED FILL — AMLODIPINE BESYLATE 5 MG TA: 5 | 90 days supply | Qty: 90 | Fill #1

## 2016-03-24 MED FILL — LATANOPROST 0.005% EYE DRP: 0.005 | 25 days supply | Qty: 3 | Fill #7

## 2016-03-26 MED FILL — FUROSEMIDE 20 MG TABLET: 20 | 45 days supply | Qty: 90 | Fill #1

## 2016-04-03 DIAGNOSIS — H401111 Primary open-angle glaucoma, right eye, mild stage: Secondary | ICD-10-CM | POA: Diagnosis not present

## 2016-04-08 DIAGNOSIS — Z96662 Presence of left artificial ankle joint: Secondary | ICD-10-CM | POA: Diagnosis not present

## 2016-04-08 DIAGNOSIS — M02872 Other reactive arthropathies, left ankle and foot: Secondary | ICD-10-CM | POA: Diagnosis not present

## 2016-04-19 MED FILL — LISINOPRIL 40 MG TABLET: 40 | 90 days supply | Qty: 90 | Fill #1

## 2016-05-07 MED FILL — HYDROCHLOROTHIAZIDE 12.5 MG: 12.5 | 90 days supply | Qty: 90 | Fill #1

## 2016-05-07 MED FILL — LATANOPROST 0.005% EYE DRP: 0.005 | 25 days supply | Qty: 3 | Fill #8

## 2016-05-14 DIAGNOSIS — I10 Essential (primary) hypertension: Secondary | ICD-10-CM | POA: Diagnosis not present

## 2016-05-14 DIAGNOSIS — R7301 Impaired fasting glucose: Secondary | ICD-10-CM | POA: Diagnosis not present

## 2016-05-14 DIAGNOSIS — R600 Localized edema: Secondary | ICD-10-CM | POA: Diagnosis not present

## 2016-05-14 DIAGNOSIS — E669 Obesity, unspecified: Secondary | ICD-10-CM | POA: Diagnosis not present

## 2016-06-01 MED FILL — AMLODIPINE BESYLATE 5 MG TA: 5 | 90 days supply | Qty: 90 | Fill #0

## 2016-06-01 MED FILL — FUROSEMIDE 20 MG TABLET: 20 | 45 days supply | Qty: 90 | Fill #0

## 2016-06-03 MED FILL — LATANOPROST 0.005% EYE DRP: 0.005 | 25 days supply | Qty: 3 | Fill #9

## 2016-07-16 DIAGNOSIS — G4733 Obstructive sleep apnea (adult) (pediatric): Secondary | ICD-10-CM | POA: Diagnosis not present

## 2016-08-05 MED FILL — LATANOPROST 0.005% EYE DRP: 0.005 | 90 days supply | Qty: 8 | Fill #0

## 2016-08-13 DIAGNOSIS — M19072 Primary osteoarthritis, left ankle and foot: Secondary | ICD-10-CM | POA: Diagnosis not present

## 2016-08-13 DIAGNOSIS — M89562 Osteolysis, left lower leg: Secondary | ICD-10-CM | POA: Diagnosis not present

## 2016-08-13 DIAGNOSIS — M25872 Other specified joint disorders, left ankle and foot: Secondary | ICD-10-CM | POA: Diagnosis not present

## 2016-08-13 DIAGNOSIS — Z96662 Presence of left artificial ankle joint: Secondary | ICD-10-CM | POA: Diagnosis not present

## 2016-09-01 MED FILL — AMOXICILLIN 500 MG CAPSULE: 500 | 2 days supply | Qty: 8 | Fill #0

## 2016-09-01 MED FILL — LISINOPRIL 40 MG TABLET: 40 | 90 days supply | Qty: 90 | Fill #0

## 2016-09-15 MED FILL — FUROSEMIDE 20 MG TABLET: 20 | 45 days supply | Qty: 90 | Fill #1

## 2016-09-28 DIAGNOSIS — G8929 Other chronic pain: Secondary | ICD-10-CM | POA: Diagnosis not present

## 2016-09-28 DIAGNOSIS — M25562 Pain in left knee: Secondary | ICD-10-CM | POA: Diagnosis not present

## 2016-09-28 DIAGNOSIS — M1712 Unilateral primary osteoarthritis, left knee: Secondary | ICD-10-CM | POA: Diagnosis not present

## 2016-10-08 DIAGNOSIS — H2513 Age-related nuclear cataract, bilateral: Secondary | ICD-10-CM | POA: Diagnosis not present

## 2016-10-08 DIAGNOSIS — H401111 Primary open-angle glaucoma, right eye, mild stage: Secondary | ICD-10-CM | POA: Diagnosis not present

## 2016-11-02 MED FILL — HYDROCHLOROTHIAZIDE 12.5 MG: 12.5 | 90 days supply | Qty: 90 | Fill #0

## 2016-11-13 MED FILL — LATANOPROST 0.005% EYE DRP: 0.005 | 75 days supply | Qty: 8 | Fill #1

## 2016-11-24 MED FILL — AMLODIPINE BESYLATE 5 MG TA: 5 | 90 days supply | Qty: 90 | Fill #1

## 2016-11-27 DIAGNOSIS — F329 Major depressive disorder, single episode, unspecified: Secondary | ICD-10-CM | POA: Diagnosis not present

## 2016-11-27 MED FILL — SERTRALINE HCL 50 MG TABLET: 50 | 30 days supply | Qty: 30 | Fill #0

## 2016-12-23 MED FILL — SERTRALINE HCL 50 MG TABLET: 50 | 30 days supply | Qty: 30 | Fill #1

## 2016-12-28 MED FILL — LISINOPRIL 40 MG TABLET: 40 | 90 days supply | Qty: 90 | Fill #1

## 2016-12-29 DIAGNOSIS — F329 Major depressive disorder, single episode, unspecified: Secondary | ICD-10-CM | POA: Diagnosis not present

## 2017-01-15 MED FILL — SERTRALINE HCL 50 MG TABLET: 50 | 30 days supply | Qty: 45 | Fill #0

## 2017-01-22 MED FILL — FUROSEMIDE 20 MG TABLET: 20 | 45 days supply | Qty: 90 | Fill #0

## 2017-01-27 MED FILL — HYDROCHLOROTHIAZIDE 12.5 MG: 12.5 | 90 days supply | Qty: 90 | Fill #1

## 2017-01-29 DIAGNOSIS — Z96662 Presence of left artificial ankle joint: Secondary | ICD-10-CM | POA: Diagnosis not present

## 2017-01-29 DIAGNOSIS — M02872 Other reactive arthropathies, left ankle and foot: Secondary | ICD-10-CM | POA: Diagnosis not present

## 2017-02-12 MED FILL — SERTRALINE HCL 50 MG TABLET: 50 | 30 days supply | Qty: 45 | Fill #1

## 2017-02-26 MED FILL — AMLODIPINE BESYLATE 5 MG TA: 5 | 90 days supply | Qty: 90 | Fill #0

## 2017-03-08 MED FILL — AMOXICILLIN 500 MG CAPSULE: 500 | 2 days supply | Qty: 8 | Fill #0

## 2017-03-23 MED FILL — LATANOPROST 0.005% EYE DRP: 0.005 | 75 days supply | Qty: 8 | Fill #2

## 2017-03-24 MED FILL — FUROSEMIDE 20 MG TAB: 20 | 45 days supply | Qty: 90 | Fill #0

## 2017-03-24 MED FILL — SERTRALINE HCL 50 MG TABLET: 50 | 30 days supply | Qty: 45 | Fill #2

## 2017-04-09 DIAGNOSIS — R7303 Prediabetes: Secondary | ICD-10-CM | POA: Diagnosis not present

## 2017-04-09 DIAGNOSIS — E559 Vitamin D deficiency, unspecified: Secondary | ICD-10-CM | POA: Diagnosis not present

## 2017-04-09 DIAGNOSIS — R600 Localized edema: Secondary | ICD-10-CM | POA: Diagnosis not present

## 2017-04-09 DIAGNOSIS — I10 Essential (primary) hypertension: Secondary | ICD-10-CM | POA: Diagnosis not present

## 2017-04-09 DIAGNOSIS — F329 Major depressive disorder, single episode, unspecified: Secondary | ICD-10-CM | POA: Diagnosis not present

## 2017-04-15 DIAGNOSIS — H401111 Primary open-angle glaucoma, right eye, mild stage: Secondary | ICD-10-CM | POA: Diagnosis not present

## 2017-04-16 MED FILL — LISINOPRIL 40 MG TABLET: 40 | 90 days supply | Qty: 90 | Fill #0

## 2017-05-06 MED FILL — SERTRALINE HCL 50 MG TABLET: 50 | 90 days supply | Qty: 135 | Fill #0

## 2017-05-27 MED FILL — FUROSEMIDE 20 MG TAB: 20 | 45 days supply | Qty: 90 | Fill #0

## 2017-05-27 MED FILL — HYDROCHLOROTHIAZIDE 12.5 MG: 12.5 | 90 days supply | Qty: 90 | Fill #0

## 2017-07-09 MED FILL — AMLODIPINE BESYLATE 5 MG TA: 5 | 90 days supply | Qty: 90 | Fill #0

## 2017-07-25 MED FILL — LATANOPROST 0.005% EYE DRP: 0.005 | 75 days supply | Qty: 8 | Fill #3

## 2017-07-26 MED FILL — FUROSEMIDE 20 MG TABLET: 20 | 45 days supply | Qty: 90 | Fill #1

## 2017-08-24 MED FILL — LISINOPRIL 40 MG TABLET: 40 | 90 days supply | Qty: 90 | Fill #1

## 2017-08-24 MED FILL — SERTRALINE HCL 50 MG TABLET: 50 | 90 days supply | Qty: 135 | Fill #1

## 2017-10-05 MED FILL — AMOXICILLIN 500 MG CAPSULE: 500 | 2 days supply | Qty: 8 | Fill #1

## 2017-10-14 DIAGNOSIS — H401111 Primary open-angle glaucoma, right eye, mild stage: Secondary | ICD-10-CM | POA: Diagnosis not present

## 2017-10-14 DIAGNOSIS — H2513 Age-related nuclear cataract, bilateral: Secondary | ICD-10-CM | POA: Diagnosis not present

## 2017-10-14 MED FILL — FUROSEMIDE 20 MG TABS: 20 | 45 days supply | Qty: 90 | Fill #0

## 2017-10-14 MED FILL — HYDROCHLOROTHIAZIDE 12.5 MG: 12.5 | 90 days supply | Qty: 90 | Fill #1

## 2017-10-25 DIAGNOSIS — J209 Acute bronchitis, unspecified: Secondary | ICD-10-CM | POA: Diagnosis not present

## 2017-10-25 MED FILL — HYDROCODONE-HOMATROPINE SOL: 5-1.5 | 5 days supply | Qty: 100 | Fill #0

## 2017-10-25 MED FILL — VENTOLIN HFA 90 MCG INHALER: 108 (90 BAS | 25 days supply | Qty: 18 | Fill #0

## 2017-11-03 MED FILL — AZITHROMYCIN 250 MG TABS: 250 | 5 days supply | Qty: 6 | Fill #0

## 2017-12-07 DIAGNOSIS — H25812 Combined forms of age-related cataract, left eye: Secondary | ICD-10-CM | POA: Diagnosis not present

## 2017-12-07 DIAGNOSIS — H52222 Regular astigmatism, left eye: Secondary | ICD-10-CM | POA: Diagnosis not present

## 2017-12-07 DIAGNOSIS — H52202 Unspecified astigmatism, left eye: Secondary | ICD-10-CM | POA: Diagnosis not present

## 2017-12-07 DIAGNOSIS — H2512 Age-related nuclear cataract, left eye: Secondary | ICD-10-CM | POA: Diagnosis not present

## 2017-12-08 MED FILL — DUREZOL 0.05% EYE DROPS: 0.05 | 25 days supply | Qty: 5 | Fill #0

## 2017-12-08 MED FILL — AMLODIPINE BESYLATE 5 MG TA: 5 | 90 days supply | Qty: 90 | Fill #1

## 2017-12-09 MED FILL — LATANOPROST 0.005% EYE DRP: 0.005 | 75 days supply | Qty: 8 | Fill #0

## 2017-12-23 MED FILL — FUROSEMIDE 20 MG TABS: 20 | 45 days supply | Qty: 90 | Fill #0

## 2017-12-28 MED FILL — LISINOPRIL 40 MG TABLET: 40 | 90 days supply | Qty: 90 | Fill #0

## 2018-01-18 DIAGNOSIS — H2511 Age-related nuclear cataract, right eye: Secondary | ICD-10-CM | POA: Diagnosis not present

## 2018-01-18 DIAGNOSIS — H25811 Combined forms of age-related cataract, right eye: Secondary | ICD-10-CM | POA: Diagnosis not present

## 2018-01-18 DIAGNOSIS — H52201 Unspecified astigmatism, right eye: Secondary | ICD-10-CM | POA: Diagnosis not present

## 2018-02-08 DIAGNOSIS — R600 Localized edema: Secondary | ICD-10-CM | POA: Diagnosis not present

## 2018-02-08 DIAGNOSIS — F329 Major depressive disorder, single episode, unspecified: Secondary | ICD-10-CM | POA: Diagnosis not present

## 2018-02-08 DIAGNOSIS — I10 Essential (primary) hypertension: Secondary | ICD-10-CM | POA: Diagnosis not present

## 2018-02-08 DIAGNOSIS — R7303 Prediabetes: Secondary | ICD-10-CM | POA: Diagnosis not present

## 2018-02-08 DIAGNOSIS — E559 Vitamin D deficiency, unspecified: Secondary | ICD-10-CM | POA: Diagnosis not present

## 2018-02-08 MED FILL — HYDROCHLOROTHIAZIDE 12.5 MG: 12.5 | 90 days supply | Qty: 90 | Fill #0

## 2018-02-09 DIAGNOSIS — E559 Vitamin D deficiency, unspecified: Secondary | ICD-10-CM | POA: Diagnosis not present

## 2018-02-09 DIAGNOSIS — R7303 Prediabetes: Secondary | ICD-10-CM | POA: Diagnosis not present

## 2018-02-09 DIAGNOSIS — I10 Essential (primary) hypertension: Secondary | ICD-10-CM | POA: Diagnosis not present

## 2018-02-11 MED FILL — FUROSEMIDE 20 MG TABS: 20 | 90 days supply | Qty: 180 | Fill #0

## 2018-03-23 MED FILL — AMLODIPINE BESYLATE 5 MG TA: 5 | 90 days supply | Qty: 90 | Fill #0

## 2018-04-12 MED FILL — LATANOPROST 0.005% EYE DRP: 0.005 | 75 days supply | Qty: 8 | Fill #1

## 2018-04-18 MED FILL — LISINOPRIL 40 MG TABLET: 40 | 90 days supply | Qty: 90 | Fill #0

## 2018-05-16 MED FILL — HYDROCHLOROTHIAZIDE 12.5 MG: 12.5 | 90 days supply | Qty: 90 | Fill #1

## 2018-05-27 MED FILL — FUROSEMIDE 20 MG TABS: 20 | 90 days supply | Qty: 180 | Fill #1

## 2018-05-28 DIAGNOSIS — R21 Rash and other nonspecific skin eruption: Secondary | ICD-10-CM | POA: Diagnosis not present

## 2018-07-04 MED FILL — AMLODIPINE BESYLATE 5 MG TA: 5 | 90 days supply | Qty: 90 | Fill #1

## 2018-07-26 MED FILL — LATANOPROST 0.005% EYE DRP: 0.005 | 75 days supply | Qty: 8 | Fill #2

## 2018-07-26 MED FILL — LISINOPRIL 40 MG TABLET: 40 | 90 days supply | Qty: 90 | Fill #1

## 2018-07-27 MED FILL — HYDROCHLOROTHIAZIDE 12.5 MG: 12.5 | 90 days supply | Qty: 90 | Fill #0

## 2018-08-18 DIAGNOSIS — I10 Essential (primary) hypertension: Secondary | ICD-10-CM | POA: Diagnosis not present

## 2018-08-18 DIAGNOSIS — R7303 Prediabetes: Secondary | ICD-10-CM | POA: Diagnosis not present

## 2018-09-08 MED FILL — FUROSEMIDE 20 MG TABS: 20 | 90 days supply | Qty: 180 | Fill #0

## 2018-10-10 MED FILL — AMOXICILLIN 500 MG CAPSULE: 500 | 2 days supply | Qty: 8 | Fill #0

## 2018-10-11 MED FILL — AMLODIPINE BESYLATE 5 MG TA: 5 | 90 days supply | Qty: 90 | Fill #0

## 2018-10-11 MED FILL — FUROSEMIDE 20 MG TABS: 20 | 30 days supply | Qty: 60 | Fill #1

## 2018-10-14 DIAGNOSIS — H43811 Vitreous degeneration, right eye: Secondary | ICD-10-CM | POA: Diagnosis not present

## 2018-10-14 DIAGNOSIS — H401131 Primary open-angle glaucoma, bilateral, mild stage: Secondary | ICD-10-CM | POA: Diagnosis not present

## 2018-10-14 DIAGNOSIS — Z961 Presence of intraocular lens: Secondary | ICD-10-CM | POA: Diagnosis not present

## 2018-11-04 MED FILL — LISINOPRIL 40 MG TABLET: 40 | 90 days supply | Qty: 90 | Fill #0

## 2018-11-15 MED FILL — FUROSEMIDE 20 MG TABS: 20 | 90 days supply | Qty: 180 | Fill #0

## 2018-11-29 MED FILL — HYDROCHLOROTHIAZIDE 12.5 MG: 12.5 | 30 days supply | Qty: 30 | Fill #0

## 2018-12-30 MED FILL — LATANOPROST 0.005% EYE DRP: 0.005 | 25 days supply | Qty: 3 | Fill #0

## 2019-01-06 MED FILL — HYDROCHLOROTHIAZIDE 12.5 MG: 12.5 | 30 days supply | Qty: 30 | Fill #1

## 2019-01-20 MED FILL — AMLODIPINE BESYLATE 5 MG TA: 5 | 30 days supply | Qty: 30 | Fill #0

## 2019-02-06 MED FILL — HYDROCHLOROTHIAZIDE 12.5 MG: 12.5 | 30 days supply | Qty: 30 | Fill #2

## 2019-02-08 MED FILL — LISINOPRIL 40 MG TABLET: 40 | 30 days supply | Qty: 30 | Fill #1

## 2019-02-08 MED FILL — LATANOPROST 0.005% EYE DRP: 0.005 | 25 days supply | Qty: 3 | Fill #1

## 2019-02-26 MED FILL — AMLODIPINE BESYLATE 5 MG TA: 5 | 30 days supply | Qty: 30 | Fill #1

## 2019-02-27 MED FILL — FUROSEMIDE 20 MG TABS: 20 | 30 days supply | Qty: 60 | Fill #0

## 2019-03-08 MED FILL — LATANOPROST 0.005% EYE DRP: 0.005 | 25 days supply | Qty: 3 | Fill #0

## 2019-03-08 MED FILL — LISINOPRIL 40 MG TABLET: 40 | 30 days supply | Qty: 30 | Fill #2

## 2019-03-08 MED FILL — HYDROCHLOROTHIAZIDE 12.5 MG: 12.5 | 30 days supply | Qty: 30 | Fill #3

## 2019-03-27 MED FILL — AMLODIPINE BESYLATE 5 MG TA: 5 | 30 days supply | Qty: 30 | Fill #2

## 2019-04-04 MED FILL — FUROSEMIDE 20 MG TABS: 20 | 30 days supply | Qty: 60 | Fill #1

## 2019-04-11 MED FILL — LISINOPRIL 40 MG TABLET: 40 | 30 days supply | Qty: 30 | Fill #3

## 2019-04-11 MED FILL — HYDROCHLOROTHIAZIDE 12.5 MG: 12.5 | 30 days supply | Qty: 30 | Fill #4

## 2019-04-13 MED FILL — FUROSEMIDE 20 MG TABS: 20 | 30 days supply | Qty: 60 | Fill #1

## 2019-04-18 MED FILL — AMOXICILLIN 500 MG CAPSULE: 500 | 7 days supply | Qty: 21 | Fill #0

## 2019-04-26 MED FILL — AMLODIPINE BESYLATE 5 MG TA: 5 | 30 days supply | Qty: 30 | Fill #3

## 2019-04-26 MED FILL — LATANOPROST 0.005% EYE DRP: 0.005 | 25 days supply | Qty: 3 | Fill #1

## 2019-05-08 MED FILL — FUROSEMIDE 20 MG TABS: 20 | 30 days supply | Qty: 60 | Fill #2

## 2019-05-16 MED FILL — LISINOPRIL 40 MG TABLET: 40 | 30 days supply | Qty: 30 | Fill #0

## 2019-05-17 MED FILL — AMOXICILLIN 500 MG CAPSULE: 500 | 7 days supply | Qty: 21 | Fill #0

## 2019-06-02 MED FILL — AMLODIPINE BESYLATE 5 MG TA: 5 | 30 days supply | Qty: 30 | Fill #4

## 2019-06-05 MED FILL — FUROSEMIDE 20 MG TABS: 20 | 30 days supply | Qty: 60 | Fill #2

## 2019-06-08 MED FILL — LISINOPRIL 40 MG TABLET: 40 | 30 days supply | Qty: 30 | Fill #0

## 2019-06-14 MED FILL — HYDROCHLOROTHIAZIDE 12.5 MG: 12.5 | 30 days supply | Qty: 30 | Fill #0

## 2019-06-15 MED FILL — LATANOPROST 0.005% EYE DRP: 0.005 | 25 days supply | Qty: 3 | Fill #2

## 2019-07-01 MED FILL — AMLODIPINE BESYLATE 5 MG TA: 5 | 30 days supply | Qty: 30 | Fill #5

## 2019-07-17 MED FILL — HYDROCHLOROTHIAZIDE 12.5 MG: 12.5 | 30 days supply | Qty: 30 | Fill #1

## 2019-07-17 MED FILL — LISINOPRIL 40 MG TABLET: 40 | 30 days supply | Qty: 30 | Fill #1

## 2019-07-31 MED FILL — AMLODIPINE BESYLATE 5 MG TA: 5 | 30 days supply | Qty: 30 | Fill #0

## 2019-07-31 MED FILL — LATANOPROST 0.005% OPTH SOL: 0.005 | 25 days supply | Qty: 3 | Fill #3

## 2019-08-09 MED FILL — FUROSEMIDE 20 MG TABS: 20 | 30 days supply | Qty: 60 | Fill #4

## 2019-08-14 MED FILL — LISINOPRIL 40 MG TABLET: 40 | 30 days supply | Qty: 30 | Fill #2

## 2019-08-14 MED FILL — HYDROCHLOROTHIAZIDE 12.5 MG: 12.5 | 30 days supply | Qty: 30 | Fill #2

## 2019-09-03 ENCOUNTER — Inpatient Hospital Stay (HOSPITAL_COMMUNITY)
Admission: EM | Admit: 2019-09-03 | Discharge: 2019-09-09 | DRG: 177 | Disposition: A | Discharge status: Home Health | Payer: 59 | Attending: Internal Medicine | Admitting: Internal Medicine

## 2019-09-03 ENCOUNTER — Emergency Department (HOSPITAL_COMMUNITY): Payer: 59

## 2019-09-03 ENCOUNTER — Other Ambulatory Visit: Payer: Self-pay

## 2019-09-03 ENCOUNTER — Encounter (HOSPITAL_COMMUNITY): Payer: Self-pay

## 2019-09-03 DIAGNOSIS — K449 Diaphragmatic hernia without obstruction or gangrene: Secondary | ICD-10-CM | POA: Diagnosis present

## 2019-09-03 DIAGNOSIS — R739 Hyperglycemia, unspecified: Secondary | ICD-10-CM | POA: Diagnosis present

## 2019-09-03 DIAGNOSIS — B359 Dermatophytosis, unspecified: Secondary | ICD-10-CM | POA: Diagnosis not present

## 2019-09-03 DIAGNOSIS — Z6838 Body mass index (BMI) 38.0-38.9, adult: Secondary | ICD-10-CM

## 2019-09-03 DIAGNOSIS — I2693 Single subsegmental pulmonary embolism without acute cor pulmonale: Secondary | ICD-10-CM | POA: Diagnosis present

## 2019-09-03 DIAGNOSIS — Z79899 Other long term (current) drug therapy: Secondary | ICD-10-CM | POA: Diagnosis not present

## 2019-09-03 DIAGNOSIS — U071 COVID-19: Secondary | ICD-10-CM

## 2019-09-03 DIAGNOSIS — R7303 Prediabetes: Secondary | ICD-10-CM | POA: Diagnosis present

## 2019-09-03 DIAGNOSIS — Z7982 Long term (current) use of aspirin: Secondary | ICD-10-CM

## 2019-09-03 DIAGNOSIS — G4733 Obstructive sleep apnea (adult) (pediatric): Secondary | ICD-10-CM | POA: Diagnosis present

## 2019-09-03 DIAGNOSIS — I10 Essential (primary) hypertension: Secondary | ICD-10-CM | POA: Diagnosis present

## 2019-09-03 DIAGNOSIS — J9601 Acute respiratory failure with hypoxia: Secondary | ICD-10-CM

## 2019-09-03 DIAGNOSIS — J1282 Pneumonia due to coronavirus disease 2019: Secondary | ICD-10-CM | POA: Diagnosis present

## 2019-09-03 DIAGNOSIS — M1990 Unspecified osteoarthritis, unspecified site: Secondary | ICD-10-CM | POA: Diagnosis present

## 2019-09-03 DIAGNOSIS — E669 Obesity, unspecified: Secondary | ICD-10-CM | POA: Diagnosis present

## 2019-09-03 DIAGNOSIS — I2609 Other pulmonary embolism with acute cor pulmonale: Secondary | ICD-10-CM | POA: Diagnosis not present

## 2019-09-03 DIAGNOSIS — I2699 Other pulmonary embolism without acute cor pulmonale: Secondary | ICD-10-CM | POA: Diagnosis not present

## 2019-09-03 LAB — COMPREHENSIVE METABOLIC PANEL
ALT: 58 U/L — ABNORMAL HIGH (ref 0–44)
AST: 51 U/L — ABNORMAL HIGH (ref 15–41)
Albumin: 3.5 g/dL (ref 3.5–5.0)
Alkaline Phosphatase: 39 U/L (ref 38–126)
Anion gap: 12 (ref 5–15)
BUN: 26 mg/dL — ABNORMAL HIGH (ref 8–23)
CO2: 22 mmol/L (ref 22–32)
Calcium: 8.8 mg/dL — ABNORMAL LOW (ref 8.9–10.3)
Chloride: 103 mmol/L (ref 98–111)
Creatinine, Ser: 0.82 mg/dL (ref 0.61–1.24)
GFR calc Af Amer: >60 mL/min (ref >60)
GFR calc non Af Amer: >60 mL/min (ref >60)
Glucose, Bld: 122 mg/dL — ABNORMAL HIGH (ref 70–99)
Potassium: 4.1 mmol/L (ref 3.5–5.1)
Sodium: 137 mmol/L (ref 135–145)
Total Bilirubin: 0.8 mg/dL (ref 0.3–1.2)
Total Protein: 8 g/dL (ref 6.5–8.1)

## 2019-09-03 LAB — CBC WITH DIFFERENTIAL/PLATELET
Abs Immature Granulocytes: 0.07 10*3/uL (ref 0.00–0.07)
Basophils Absolute: 0 10*3/uL (ref 0.0–0.1)
Basophils Relative: 1 %
Eosinophils Absolute: 0 10*3/uL (ref 0.0–0.5)
Eosinophils Relative: 0 %
HCT: 44.2 % (ref 39.0–52.0)
Hemoglobin: 15.2 g/dL (ref 13.0–17.0)
Immature Granulocytes: 1 %
Lymphocytes Relative: 14 %
Lymphs Abs: 0.9 10*3/uL (ref 0.7–4.0)
MCH: 29.7 pg (ref 26.0–34.0)
MCHC: 34.4 g/dL (ref 30.0–36.0)
MCV: 86.3 fL (ref 80.0–100.0)
Monocytes Absolute: 0.6 10*3/uL (ref 0.1–1.0)
Monocytes Relative: 9 %
Neutro Abs: 4.9 10*3/uL (ref 1.7–7.7)
Neutrophils Relative %: 75 %
Platelets: 263 10*3/uL (ref 150–400)
RBC: 5.12 MIL/uL (ref 4.22–5.81)
RDW: 14.1 % (ref 11.5–15.5)
WBC: 6.5 10*3/uL (ref 4.0–10.5)
nRBC: 0 % (ref 0.0–0.2)

## 2019-09-03 LAB — FIBRINOGEN: Fibrinogen: 714 mg/dL — ABNORMAL HIGH (ref 210–475)

## 2019-09-03 LAB — POC SARS CORONAVIRUS 2 AG -  ED: SARS Coronavirus 2 Ag: POSITIVE — AB

## 2019-09-03 LAB — TRIGLYCERIDES: Triglycerides: 83 mg/dL (ref <150)

## 2019-09-03 LAB — LACTIC ACID, PLASMA
Lactic Acid, Venous: 1.5 mmol/L (ref 0.5–1.9)
Lactic Acid, Venous: 1.5 mmol/L (ref 0.5–1.9)

## 2019-09-03 LAB — PROCALCITONIN: Procalcitonin: <0.1 ng/mL

## 2019-09-03 LAB — D-DIMER, QUANTITATIVE: D-Dimer, Quant: 3.31 ug/mL-FEU — ABNORMAL HIGH (ref 0.00–0.50)

## 2019-09-03 LAB — LACTATE DEHYDROGENASE: LDH: 413 U/L — ABNORMAL HIGH (ref 98–192)

## 2019-09-03 MED ORDER — SODIUM CHLORIDE 0.9 % IV SOLN
100.0000 mg | Freq: Every day | INTRAVENOUS | Status: AC
  Administered 2019-09-04 – 2019-09-07 (×4): 100 mg via INTRAVENOUS
  Filled 2019-09-03 (×4): qty 20

## 2019-09-03 MED ORDER — SODIUM CHLORIDE 0.9 % IV SOLN
200.0000 mg | Freq: Once | INTRAVENOUS | Status: AC
  Administered 2019-09-03: 200 mg via INTRAVENOUS
  Filled 2019-09-03: qty 200

## 2019-09-03 MED ORDER — DEXAMETHASONE SODIUM PHOSPHATE 10 MG/ML IJ SOLN
10.0000 mg | Freq: Once | INTRAMUSCULAR | Status: AC
  Administered 2019-09-03: 10 mg via INTRAVENOUS
  Filled 2019-09-03: qty 1

## 2019-09-03 NOTE — H&P (Signed)
History and Physical    Roy Lee JKK:[id number] DOB: 1956/01/22 DOA: 09/03/2019  PCP: Carol Ada, MD   Patient coming from: Home  Chief Complaint: I have worsening shortness of breath and joint pains  HPI: Roy Lee is a 64 y.o. male with medical history significant of essential hypertension, chronic arthritis and obesity.  Patient presents to the emergency room 4 days after testing positive for Covid with worsening shortness of breath.  Shortness of breath is still significant to the extent he is unable to ambulate without having to take breaks to catch his breath.  Symptoms associated with cough which is nonproductive.  He was noted to be hypoxic with oxygen saturations in the 70's on presentation.  This improved with oxygen supplementation with 2 L/min of oxygen.  Denies any headache, chest pain, palpitations, nausea or vomiting.   ED Course: Patient was noted to be hypoxic on presentation has requiring oxygen supplementation with nasal cannula.  Currently remained stable on 3 L/min of oxygen.  Patient was initiated on Decadron and IV fluids.  Hospitalist consulted for evaluation and further management.  Review of Systems: As per HPI otherwise 10 point review of systems negative.    Past Medical History:  Diagnosis Date  . Arthritis   . Hypertension   . Obesity     History reviewed. No pertinent surgical history.   reports that he has never smoked. He has never used smokeless tobacco. He reports current alcohol use. He reports that he does not use drugs.  No Known Allergies  History reviewed. No pertinent family history.   Prior to Admission medications   Medication Sig Start Date End Date Taking? Authorizing Provider  amLODipine (NORVASC) 5 MG tablet Take 5 mg by mouth daily. 07/31/19  Yes [provider]  aspirin 81 MG tablet Take 81 mg by mouth daily.   Yes [provider]  furosemide (LASIX) 20 MG tablet Take 20 mg by mouth daily.     Yes [provider]  hydrochlorothiazide (MICROZIDE) 12.5 MG capsule Take 12.5 mg by mouth daily.   Yes [provider]  lisinopril (PRINIVIL,ZESTRIL) 40 MG tablet Take 40 mg by mouth daily.   Yes [provider]  Omega-3 Fatty Acids (FISH OIL) 1000 MG CAPS Take 1,000 mg by mouth daily.    Yes [provider]  vitamin C (ASCORBIC ACID) 500 MG tablet Take 500 mg by mouth daily.   Yes [provider]  latanoprost (XALATAN) 0.005 % ophthalmic solution Place 1 drop into both eyes daily. 07/31/19   [provider]    Physical Exam: Vitals:   09/03/19 1905 09/03/19 1906 09/03/19 1930 09/03/19 2000  BP:  127/75 130/86 122/88  Pulse: 77 80 75 80  Resp: (!) 28 15    Temp:      TempSrc:      SpO2: 98% 94% 94% (!) 88%    Constitutional: Obese, Looks acutely ill, NAD, calm, comfortable on current settings of oxygen. Vitals:   09/03/19 1905 09/03/19 1906 09/03/19 1930 09/03/19 2000  BP:  127/75 130/86 122/88  Pulse: 77 80 75 80  Resp: (!) 28 15    Temp:      TempSrc:      SpO2: 98% 94% 94% (!) 88%   Eyes: PERRL, lids and conjunctivae normal ENMT: Mucous membranes are moist. Posterior pharynx clear of any exudate or lesions.Normal dentition.  Neck: normal, supple, no masses, no thyromegaly Respiratory:dimisnhed breath sounds bilaterally Cardiovascular: Regular rate and rhythm,  no murmurs / rubs / gallops. 1+edema. 2+ pedal pulses. No carotid bruits.  Abdomen: no tenderness, no masses palpated. No hepatosplenomegaly. Bowel sounds positive.  Musculoskeletal: no clubbing / cyanosis. No joint deformity upper and lower extremities. Good ROM, no contractures. Normal muscle tone.  Skin: no rashes, lesions, ulcers. No induration Neurologic: CN 2-12 grossly intact. Sensation intact, DTR normal. Strength 5/5 in all 4.  Psychiatric: Normal judgment and insight. Alert and oriented x 3. Normal mood.   Labs on Admission: I have personally reviewed  following labs and imaging studies  CBC: Recent Labs  Lab 09/03/19 1848  WBC 6.5  NEUTROABS 4.9  HGB 15.2  HCT 44.2  MCV 86.3  PLT 263   Basic Metabolic Panel: Recent Labs  Lab 09/03/19 1848  NA 137  K 4.1  CL 103  CO2 22  GLUCOSE 122*  BUN 26*  CREATININE 0.82  CALCIUM 8.8*   GFR: CrCl cannot be calculated (Unknown ideal weight.). Liver Function Tests: Recent Labs  Lab 09/03/19 1848  AST 51*  ALT 58*  ALKPHOS 39  BILITOT 0.8  PROT 8.0  ALBUMIN 3.5   No results for input(s): LIPASE, AMYLASE in the last 168 hours. No results for input(s): AMMONIA in the last 168 hours. Coagulation Profile: No results for input(s): INR, PROTIME in the last 168 hours. Cardiac Enzymes: No results for input(s): CKTOTAL, CKMB, CKMBINDEX, TROPONINI in the last 168 hours. BNP (last 3 results) No results for input(s): PROBNP in the last 8760 hours. HbA1C: No results for input(s): HGBA1C in the last 72 hours. CBG: No results for input(s): GLUCAP in the last 168 hours. Lipid Profile: Recent Labs    09/03/19 1848  TRIG 83   Thyroid Function Tests: No results for input(s): TSH, T4TOTAL, FREET4, T3FREE, THYROIDAB in the last 72 hours. Anemia Panel: No results for input(s): VITAMINB12, FOLATE, FERRITIN, TIBC, IRON, RETICCTPCT in the last 72 hours. Urine analysis:    Component Value Date/Time   COLORURINE YELLOW 05/25/2011 1100   APPEARANCEUR CLEAR 05/25/2011 1100   LABSPEC 1.007 05/25/2011 1100   PHURINE 6.0 05/25/2011 1100   GLUCOSEU NEGATIVE 05/25/2011 1100   HGBUR NEGATIVE 05/25/2011 1100   BILIRUBINUR NEGATIVE 05/25/2011 1100   KETONESUR NEGATIVE 05/25/2011 1100   PROTEINUR NEGATIVE 05/25/2011 1100   UROBILINOGEN 0.2 05/25/2011 1100   NITRITE NEGATIVE 05/25/2011 1100   LEUKOCYTESUR NEGATIVE 05/25/2011 1100    Radiological Exams on Admission: DG Chest Port 1 View  Result Date: 09/03/2019 CLINICAL DATA:  COVID-19 pneumonia EXAM: PORTABLE CHEST 1 VIEW COMPARISON:   05/25/2011 FINDINGS: There are diffuse bilateral hazy airspace opacities consistent with the patient's history of COVID-19 pneumonia. The lung volumes are low. The heart size is normal. There may be trace to small bilateral pleural effusions. There is no acute osseous abnormality. IMPRESSION: Hazy bilateral airspace opacities consistent with the patient's history of viral pneumonia. Electronically Signed   By: Katherine Mantle M.D.   On: 09/03/2019 19:24    EKG: Independently reviewed.Poor image, Normal sinus rhythm.No acute ST wave changes noted.  Assessment/Plan Active Problems:   Pneumonia due to COVID-19 virus   #1. Acute hypoxic respiratory failure secondary to viral pneumonia secondary to COVID-19.  CXR shows bilateral opacities c/w viral pneumonia. Patient will be admitted to the Covid unit. Oxygen supplementation as tolerated by patient. Will initiate antiviral treatment with remdesivir.  Decadron, zinc and vitamin C supplementation will be offered.  As needed albuterol/ipratropium advised.  Incentive spirometry was highly emphasized.  Consider empiric antibiotic coverage  pending procalcitonin levels.  #2.  Chronic essential hypertension: Blood pressure stable.  We will continue with home medication.  Hold diuretics for now.  #3.Obesity with BMI >45  #4.Elevated D-dimer- likely acute phase reaction from COVID. Consider CTA prior to discharge. Patient is currently on Lovenox.  #5. Mild elevated LFT's c/w COVID infection. Follow up LFT's whilst on Remdesivir  DVT prophylaxis: Lovenox Code Status: FULL CODE Family Communication:  Disposition Plan: Home Consults called: Admission status: Inpatient   [name] MD [facility] Pager [phone]  If 7PM-7AM, please contact night-coverage [email or web address] Password [facility]  09/03/2019, 9:06 PM

## 2019-09-03 NOTE — ED Provider Notes (Signed)
[facility]-EMERGENCY DEPT Provider Note   CSN: [id number] Arrival date & time: 09/03/19  1748     History Chief Complaint  Patient presents with  . COVID POSITIVE  . Shortness of Breath    KEVORK JOYCE is a 64 y.o. male.  The history is provided by the patient.  Shortness of Breath Severity:  Moderate Onset quality:  Gradual Duration:  4 days Timing:  Constant Progression:  Worsening Chronicity:  New Context: URI (COVID)   Relieved by:  Nothing Worsened by:  Exertion Associated symptoms: cough   Associated symptoms: no abdominal pain, no chest pain, no ear pain, no fever, no rash, no sore throat and no vomiting        Past Medical History:  Diagnosis Date  . Arthritis   . Hypertension   . Obesity     There are no problems to display for this patient.   History reviewed. No pertinent surgical history.     History reviewed. No pertinent family history.  Social History   Tobacco Use  . Smoking status: Never Smoker  . Smokeless tobacco: Never Used  Substance Use Topics  . Alcohol use: Yes    Alcohol/week: 0.0 standard drinks    Comment: very little  . Drug use: No    Home Medications Prior to Admission medications   Medication Sig Start Date End Date Taking? Authorizing Provider  amLODipine (NORVASC) 5 MG tablet Take 5 mg by mouth daily. 07/31/19  Yes [provider]  aspirin 81 MG tablet Take 81 mg by mouth daily.   Yes [provider]  furosemide (LASIX) 20 MG tablet Take 20 mg by mouth daily.    Yes [provider]  hydrochlorothiazide (MICROZIDE) 12.5 MG capsule Take 12.5 mg by mouth daily.   Yes [provider]  lisinopril (PRINIVIL,ZESTRIL) 40 MG tablet Take 40 mg by mouth daily.   Yes [provider]  Omega-3 Fatty Acids (FISH OIL) 1000 MG CAPS Take 1,000 mg by mouth daily.    Yes [provider]  vitamin C (ASCORBIC ACID) 500 MG tablet Take 500 mg by mouth daily.    Yes [provider]  latanoprost (XALATAN) 0.005 % ophthalmic solution Place 1 drop into both eyes daily. 07/31/19   [provider]    Allergies    Patient has no known allergies.  Review of Systems   Review of Systems  Constitutional: Negative for chills and fever.  HENT: Negative for ear pain and sore throat.   Eyes: Negative for pain and visual disturbance.  Respiratory: Positive for cough and shortness of breath.   Cardiovascular: Negative for chest pain and palpitations.  Gastrointestinal: Negative for abdominal pain and vomiting.  Genitourinary: Negative for dysuria and hematuria.  Musculoskeletal: Negative for arthralgias and back pain.  Skin: Negative for color change and rash.  Neurological: Negative for seizures and syncope.  All other systems reviewed and are negative.   Physical Exam Updated Vital Signs  ED Triage Vitals  Enc Vitals Group     BP 09/03/19 1803 (!) 133/104     Pulse Rate 09/03/19 1803 85     Resp 09/03/19 1803 (!) 27     Temp 09/03/19 1803 98.9 F (37.2 C)     Temp Source 09/03/19 1803 Oral     SpO2 09/03/19 1803 93 %     Weight --      Height --      Head Circumference --  Peak Flow --      Pain Score 09/03/19 1804 0     Pain Loc --      Pain Edu? --      Excl. in [place]? --     Physical Exam Vitals and nursing note reviewed.  Constitutional:      General: He is not in acute distress.    Appearance: He is well-developed. He is not ill-appearing.  HENT:     Head: Normocephalic and atraumatic.     Mouth/Throat:     Mouth: Mucous membranes are moist.  Eyes:     Conjunctiva/sclera: Conjunctivae normal.     Pupils: Pupils are equal, round, and reactive to light.  Cardiovascular:     Rate and Rhythm: Normal rate and regular rhythm.     Pulses: Normal pulses.     Heart sounds: Normal heart sounds. No murmur.  Pulmonary:     Effort: Tachypnea present. No respiratory distress.     Breath sounds: Decreased breath  sounds present.     Comments: Coarse breath sounds throughout  Abdominal:     Palpations: Abdomen is soft.     Tenderness: There is no abdominal tenderness.  Musculoskeletal:     Cervical back: Normal range of motion and neck supple.     Right lower leg: No edema.     Left lower leg: No edema.  Skin:    General: Skin is warm and dry.     Capillary Refill: Capillary refill takes less than 2 seconds.  Neurological:     General: No focal deficit present.     Mental Status: He is alert.  Psychiatric:        Mood and Affect: Mood normal.     ED Results / Procedures / Treatments   Labs (all labs ordered are listed, but only abnormal results are displayed) Labs Reviewed  D-DIMER, QUANTITATIVE (NOT AT [facility]) - Abnormal; Notable for the following components:      Result Value   D-Dimer, Quant 3.31 (*)    All other components within normal limits  FIBRINOGEN - Abnormal; Notable for the following components:   Fibrinogen 714 (*)    All other components within normal limits  POC SARS CORONAVIRUS 2 AG -  ED - Abnormal; Notable for the following components:   SARS Coronavirus 2 Ag POSITIVE (*)    All other components within normal limits  CULTURE, BLOOD (ROUTINE X 2)  CULTURE, BLOOD (ROUTINE X 2)  CBC WITH DIFFERENTIAL/PLATELET  LACTIC ACID, PLASMA  LACTIC ACID, PLASMA  COMPREHENSIVE METABOLIC PANEL  PROCALCITONIN  LACTATE DEHYDROGENASE  FERRITIN  TRIGLYCERIDES  C-REACTIVE PROTEIN  URINALYSIS, ROUTINE W REFLEX MICROSCOPIC    EKG EKG Interpretation  Date/Time:  Sunday September 03 2019 18:11:53 EST Ventricular Rate:  78 PR Interval:    QRS Duration: 121 QT Interval:  678 QTC Calculation: 768 R Axis:   -6 Text Interpretation: Poor data quality appears to be sinus, will re-check Confirmed by Lennice Sites [phone]) on 09/03/2019 6:16:58 PM   Radiology DG Chest Port 1 View  Result Date: 09/03/2019 CLINICAL DATA:  COVID-19 pneumonia EXAM: PORTABLE CHEST 1 VIEW COMPARISON:   05/25/2011 FINDINGS: There are diffuse bilateral hazy airspace opacities consistent with the patient's history of COVID-19 pneumonia. The lung volumes are low. The heart size is normal. There may be trace to small bilateral pleural effusions. There is no acute osseous abnormality. IMPRESSION: Hazy bilateral airspace opacities consistent with the patient's history of viral pneumonia. Electronically Signed  By: Katherine Mantle M.D.   On: 09/03/2019 19:24    Procedures .Critical Care Performed by: Virgina Norfolk, DO Authorized by: Virgina Norfolk, DO   Critical care provider statement:    Critical care time (minutes):  35   Critical care was necessary to treat or prevent imminent or life-threatening deterioration of the following conditions:  Respiratory failure   Critical care was time spent personally by me on the following activities:  Blood draw for specimens, development of treatment plan with patient or surrogate, discussions with primary provider, evaluation of patient's response to treatment, obtaining history from patient or surrogate, ordering and review of laboratory studies, ordering and review of radiographic studies, ordering and performing treatments and interventions, pulse oximetry and review of old charts   I assumed direction of critical care for this patient from another provider in my specialty: no     (including critical care time)  Medications Ordered in ED Medications  dexamethasone (DECADRON) injection 10 mg (has no administration in time range)    ED Course  I have reviewed the triage vital signs and the nursing notes.  Pertinent labs & imaging results that were available during my care of the patient were reviewed by me and considered in my medical decision making (see chart for details).    MDM Rules/Calculators/A&P                      DEDRIC ETHINGTON is a 64 year old male with history of hypertension who presents to the ED with shortness of breath.   Patient hypoxic to the 70s upon arrival.  Resolved with 3 L of oxygen.  Tested positive for Covid several days ago.  Worsening shortness of breath especially on exertion.  Increased work of breathing.  Coarse breath sounds.  Suspect respiratory failure secondary to coronavirus.  Will give Decadron and get screening labs.  Vital signs otherwise unremarkable.  Anticipate admission for further care.  Chest x-ray consistent with coronavirus.  No significant leukocytosis, anemia, electrolyte abnormality.  Informatory markers elevated.  Patient to be admitted for respiratory failure in the setting of coronavirus.  Hemodynamically stable throughout my care.  This chart was dictated using voice recognition software.  Despite best efforts to proofread,  errors can occur which can change the documentation meaning.  ZEPHANIAH LUBRANO was evaluated in Emergency Department on 09/03/2019 for the symptoms described in the history of present illness. He was evaluated in the context of the global COVID-19 pandemic, which necessitated consideration that the patient might be at risk for infection with the SARS-CoV-2 virus that causes COVID-19. Institutional protocols and algorithms that pertain to the evaluation of patients at risk for COVID-19 are in a state of rapid change based on information released by regulatory bodies including the CDC and federal and state organizations. These policies and algorithms were followed during the patient's care in the ED.   Final Clinical Impression(s) / ED Diagnoses Final diagnoses:  Acute respiratory failure with hypoxia [facility])  COVID-19    Rx / DC Orders ED Discharge Orders    None       Virgina Norfolk, DO 09/03/19 2015

## 2019-09-03 NOTE — ED Notes (Signed)
[facility] notified for transport 

## 2019-09-03 NOTE — ED Triage Notes (Signed)
Patient reports testing positive on Friday.   C/o shob and body aches    Denies fever at home   93% on 2 liters  74% on RA   A/Ox4 Wheelchair

## 2019-09-04 ENCOUNTER — Inpatient Hospital Stay (HOSPITAL_COMMUNITY): Payer: 59

## 2019-09-04 ENCOUNTER — Encounter (HOSPITAL_COMMUNITY): Payer: Self-pay | Admitting: Internal Medicine

## 2019-09-04 DIAGNOSIS — J1282 Pneumonia due to coronavirus disease 2019: Secondary | ICD-10-CM

## 2019-09-04 DIAGNOSIS — J9601 Acute respiratory failure with hypoxia: Secondary | ICD-10-CM

## 2019-09-04 DIAGNOSIS — U071 COVID-19: Principal | ICD-10-CM

## 2019-09-04 LAB — BASIC METABOLIC PANEL
Anion gap: 9 (ref 5–15)
BUN: 26 mg/dL — ABNORMAL HIGH (ref 8–23)
CO2: 23 mmol/L (ref 22–32)
Calcium: 8.5 mg/dL — ABNORMAL LOW (ref 8.9–10.3)
Chloride: 106 mmol/L (ref 98–111)
Creatinine, Ser: 0.71 mg/dL (ref 0.61–1.24)
GFR calc Af Amer: >60 mL/min (ref >60)
GFR calc non Af Amer: >60 mL/min (ref >60)
Glucose, Bld: 152 mg/dL — ABNORMAL HIGH (ref 70–99)
Potassium: 4.3 mmol/L (ref 3.5–5.1)
Sodium: 138 mmol/L (ref 135–145)

## 2019-09-04 LAB — ABO/RH: ABO/RH(D): A POS

## 2019-09-04 LAB — FERRITIN: Ferritin: 969 ng/mL — ABNORMAL HIGH (ref 24–336)

## 2019-09-04 LAB — CBC
HCT: 39.9 % (ref 39.0–52.0)
Hemoglobin: 13.5 g/dL (ref 13.0–17.0)
MCH: 29.2 pg (ref 26.0–34.0)
MCHC: 33.8 g/dL (ref 30.0–36.0)
MCV: 86.2 fL (ref 80.0–100.0)
Platelets: 238 10*3/uL (ref 150–400)
RBC: 4.63 MIL/uL (ref 4.22–5.81)
RDW: 14 % (ref 11.5–15.5)
WBC: 4.4 10*3/uL (ref 4.0–10.5)
nRBC: 0 % (ref 0.0–0.2)

## 2019-09-04 LAB — HIV ANTIBODY (ROUTINE TESTING W REFLEX): HIV Screen 4th Generation wRfx: NONREACTIVE

## 2019-09-04 LAB — C-REACTIVE PROTEIN
CRP: 19.6 mg/dL — ABNORMAL HIGH (ref <1.0)
CRP: 20 mg/dL — ABNORMAL HIGH (ref <1.0)

## 2019-09-04 LAB — PROCALCITONIN: Procalcitonin: <0.1 ng/mL

## 2019-09-04 LAB — HEPATITIS B SURFACE ANTIGEN: Hepatitis B Surface Ag: NONREACTIVE

## 2019-09-04 LAB — BRAIN NATRIURETIC PEPTIDE: B Natriuretic Peptide: 60.7 pg/mL (ref 0.0–100.0)

## 2019-09-04 LAB — MAGNESIUM: Magnesium: 2.4 mg/dL (ref 1.7–2.4)

## 2019-09-04 LAB — D-DIMER, QUANTITATIVE: D-Dimer, Quant: 6.78 ug/mL-FEU — ABNORMAL HIGH (ref 0.00–0.50)

## 2019-09-04 MED ORDER — SENNOSIDES-DOCUSATE SODIUM 8.6-50 MG PO TABS: 1.0000 | ORAL_TABLET | Freq: Every evening | ORAL | Status: DC | PRN

## 2019-09-04 MED ORDER — TOCILIZUMAB 400 MG/20ML IV SOLN
800.0000 mg | Freq: Once | INTRAVENOUS | Status: AC
  Administered 2019-09-04: 800 mg via INTRAVENOUS
  Filled 2019-09-04: qty 40

## 2019-09-04 MED ORDER — ONDANSETRON HCL 4 MG/2ML IJ SOLN: 4.0000 mg | Freq: Four times a day (QID) | INTRAMUSCULAR | Status: DC | PRN

## 2019-09-04 MED ORDER — SODIUM CHLORIDE 0.9 % IV SOLN: INTRAVENOUS | Status: DC

## 2019-09-04 MED ORDER — ALUM & MAG HYDROXIDE-SIMETH 200-200-20 MG/5ML PO SUSP
30.0000 mL | Freq: Four times a day (QID) | ORAL | Status: DC | PRN
  Administered 2019-09-04 – 2019-09-08 (×7): 30 mL via ORAL
  Filled 2019-09-04 (×7): qty 30

## 2019-09-04 MED ORDER — DEXAMETHASONE 6 MG PO TABS: 6.0000 mg | ORAL_TABLET | ORAL | Status: DC

## 2019-09-04 MED ORDER — OMEGA-3-ACID ETHYL ESTERS 1 G PO CAPS
1.0000 g | ORAL_CAPSULE | Freq: Every day | ORAL | Status: DC
  Administered 2019-09-04 – 2019-09-09 (×6): 1 g via ORAL
  Filled 2019-09-04 (×6): qty 1

## 2019-09-04 MED ORDER — ENOXAPARIN SODIUM 40 MG/0.4ML ~~LOC~~ SOLN
40.0000 mg | SUBCUTANEOUS | Status: DC
  Administered 2019-09-04: 40 mg via SUBCUTANEOUS
  Filled 2019-09-04: qty 0.4

## 2019-09-04 MED ORDER — GUAIFENESIN-DM 100-10 MG/5ML PO SYRP
10.0000 mL | ORAL_SOLUTION | ORAL | Status: DC | PRN
  Administered 2019-09-04 – 2019-09-06 (×2): 10 mL via ORAL
  Filled 2019-09-04: qty 10

## 2019-09-04 MED ORDER — METHYLPREDNISOLONE SODIUM SUCC 125 MG IJ SOLR
60.0000 mg | Freq: Two times a day (BID) | INTRAMUSCULAR | Status: DC
  Administered 2019-09-04 – 2019-09-06 (×6): 60 mg via INTRAVENOUS
  Filled 2019-09-04 (×6): qty 2

## 2019-09-04 MED ORDER — LATANOPROST 0.005 % OP SOLN
1.0000 [drp] | Freq: Every day | OPHTHALMIC | Status: DC
  Administered 2019-09-04 – 2019-09-09 (×6): 1 [drp] via OPHTHALMIC
  Filled 2019-09-04: qty 2.5

## 2019-09-04 MED ORDER — SODIUM CHLORIDE 0.9 % IV SOLN: 100.0000 mg | Freq: Every day | INTRAVENOUS | Status: DC

## 2019-09-04 MED ORDER — ACETAMINOPHEN 325 MG PO TABS: 650.0000 mg | ORAL_TABLET | Freq: Four times a day (QID) | ORAL | Status: DC | PRN

## 2019-09-04 MED ORDER — LISINOPRIL 20 MG PO TABS
40.0000 mg | ORAL_TABLET | Freq: Every day | ORAL | Status: DC
  Administered 2019-09-04 – 2019-09-05 (×2): 40 mg via ORAL
  Filled 2019-09-04 (×2): qty 2

## 2019-09-04 MED ORDER — SODIUM CHLORIDE 0.9 % IV SOLN: 200.0000 mg | Freq: Once | INTRAVENOUS | Status: DC

## 2019-09-04 MED ORDER — ZINC SULFATE 220 (50 ZN) MG PO CAPS
220.0000 mg | ORAL_CAPSULE | Freq: Every day | ORAL | Status: DC
  Administered 2019-09-04 – 2019-09-09 (×6): 220 mg via ORAL
  Filled 2019-09-04 (×6): qty 1

## 2019-09-04 MED ORDER — ENOXAPARIN SODIUM 80 MG/0.8ML ~~LOC~~ SOLN
80.0000 mg | SUBCUTANEOUS | Status: AC
  Administered 2019-09-04: 80 mg via SUBCUTANEOUS
  Filled 2019-09-04: qty 0.8

## 2019-09-04 MED ORDER — IOHEXOL 350 MG/ML SOLN
80.0000 mL | Freq: Once | INTRAVENOUS | Status: AC | PRN
  Administered 2019-09-04: 80 mL via INTRAVENOUS

## 2019-09-04 MED ORDER — ALBUTEROL SULFATE HFA 108 (90 BASE) MCG/ACT IN AERS
2.0000 | INHALATION_SPRAY | Freq: Four times a day (QID) | RESPIRATORY_TRACT | Status: DC
  Administered 2019-09-04 – 2019-09-09 (×22): 2 via RESPIRATORY_TRACT
  Filled 2019-09-04: qty 6.7

## 2019-09-04 MED ORDER — ONDANSETRON HCL 4 MG PO TABS: 4.0000 mg | ORAL_TABLET | Freq: Four times a day (QID) | ORAL | Status: DC | PRN

## 2019-09-04 MED ORDER — HYDROCOD POLST-CPM POLST ER 10-8 MG/5ML PO SUER: 5.0000 mL | Freq: Two times a day (BID) | ORAL | Status: DC | PRN

## 2019-09-04 MED ORDER — ASCORBIC ACID 500 MG PO TABS
500.0000 mg | ORAL_TABLET | Freq: Every day | ORAL | Status: DC
  Administered 2019-09-04 – 2019-09-09 (×6): 500 mg via ORAL
  Filled 2019-09-04 (×6): qty 1

## 2019-09-04 MED ORDER — AMLODIPINE BESYLATE 5 MG PO TABS
5.0000 mg | ORAL_TABLET | Freq: Every day | ORAL | Status: DC
  Administered 2019-09-04 – 2019-09-05 (×2): 5 mg via ORAL
  Filled 2019-09-04 (×2): qty 1

## 2019-09-04 MED ORDER — ASPIRIN 81 MG PO CHEW
81.0000 mg | CHEWABLE_TABLET | Freq: Every day | ORAL | Status: DC
  Administered 2019-09-04 – 2019-09-09 (×6): 81 mg via ORAL
  Filled 2019-09-04 (×6): qty 1

## 2019-09-04 MED ORDER — ENOXAPARIN SODIUM 120 MG/0.8ML ~~LOC~~ SOLN
120.0000 mg | Freq: Two times a day (BID) | SUBCUTANEOUS | Status: DC
  Administered 2019-09-04 – 2019-09-06 (×5): 120 mg via SUBCUTANEOUS
  Filled 2019-09-04 (×6): qty 0.8

## 2019-09-04 MED ORDER — HYDROCHLOROTHIAZIDE 12.5 MG PO CAPS: 12.5000 mg | ORAL_CAPSULE | Freq: Every day | ORAL | Status: DC

## 2019-09-04 NOTE — ED Notes (Signed)
ED TO INPATIENT HANDOFF REPORT  ED Nurse Name and Phone #: Johny Shears [phone]  S Name/Age/Gender Roy Lee 64 y.o. male Room/Bed: WA23/WA23  Code Status   Code Status: Not on file  Home/SNF/Other HOME Patient oriented to: self, place, time and situation Is this baseline? Yes   Triage Complete: Triage complete  Chief Complaint Pneumonia due to COVID-19 virus [U07.1, J12.82]  Triage Note Patient reports testing positive on Friday.   C/o shob and body aches    Denies fever at home   93% on 2 liters  74% on RA   A/Ox4 Wheelchair     Allergies No Known Allergies  Level of Care/Admitting Diagnosis ED Disposition    ED Disposition Condition [place]: [place] [100101]  Level of Care: Progressive [102]  Covid Evaluation: Confirmed COVID Positive  Diagnosis: Pneumonia due to COVID-19 virus [[id number]]  Admitting Physician: Artist Beach [[id number]]  Attending Physician: Artist Beach [[id number]]  Estimated length of stay: past midnight tomorrow  Certification:: I certify this patient will need inpatient services for at least 2 midnights       B Medical/Surgery History Past Medical History:  Diagnosis Date  . Arthritis   . Hypertension   . Obesity    History reviewed. No pertinent surgical history.   A IV Location/Drains/Wounds Patient Lines/Drains/Airways Status   Active Line/Drains/Airways    Name:   Placement date:   Placement time:   Site:   Days:   Peripheral IV 09/03/19 Left Forearm   09/03/19    1843    Forearm   1   Peripheral IV 09/03/19 Right Antecubital   09/03/19    1900    Antecubital   1          Intake/Output Last 24 hours  Intake/Output Summary (Last 24 hours) at 09/04/2019 0121 Last data filed at 09/03/2019 2217 Gross per 24 hour  Intake 250 ml  Output --  Net 250 ml    Labs/Imaging Results for orders placed or performed during the hospital encounter of 09/03/19 (from  the past 48 hour(s))  Lactic acid, plasma     Status: None   Collection Time: 09/03/19  6:48 PM  Result Value Ref Range   Lactic Acid, Venous 1.5 0.5 - 1.9 mmol/L    Comment: Performed at [facility], [place] [address], [place], [place] [address]  Lactic acid, plasma     Status: None   Collection Time: 09/03/19  6:48 PM  Result Value Ref Range   Lactic Acid, Venous 1.5 0.5 - 1.9 mmol/L    Comment: Performed at [facility], [place] [address], [place], [place] [address]  Blood Culture (routine x 2)     Status: None (Preliminary result)   Collection Time: 09/03/19  6:48 PM   Specimen: BLOOD  Result Value Ref Range   Specimen Description BLOOD RIGHT ANTECUBITAL    Special Requests      BOTTLES DRAWN AEROBIC AND ANAEROBIC Blood Culture results may not be optimal due to an excessive volume of blood received in culture bottles Performed at [facility], [place] [address], [place],  [address]    Culture PENDING    Report Status PENDING   Blood Culture (routine x 2)     Status: None (Preliminary result)   Collection Time: 09/03/19  6:48 PM   Specimen: BLOOD RIGHT FOREARM  Result Value Ref Range   Specimen Description BLOOD  RIGHT FOREARM    Special Requests      BOTTLES DRAWN AEROBIC AND ANAEROBIC Blood Culture adequate volume Performed at [facility] [address] [place], [place] [address]    Culture PENDING    Report Status PENDING   CBC WITH DIFFERENTIAL     Status: None   Collection Time: 09/03/19  6:48 PM  Result Value Ref Range   WBC 6.5 4.0 - 10.5 K/uL   RBC 5.12 4.22 - 5.81 MIL/uL   Hemoglobin 15.2 13.0 - 17.0 g/dL   HCT 44.2 39.0 - 52.0 %   MCV 86.3 80.0 - 100.0 fL   MCH 29.7 26.0 - 34.0 pg   MCHC 34.4 30.0 - 36.0 g/dL   RDW 14.1 11.5 - 15.5 %   Platelets 263 150 - 400 K/uL   nRBC 0.0 0.0 - 0.2 %   Neutrophils Relative % 75 %   Neutro Abs 4.9 1.7 - 7.7 K/uL   Lymphocytes Relative 14 %    Lymphs Abs 0.9 0.7 - 4.0 K/uL   Monocytes Relative 9 %   Monocytes Absolute 0.6 0.1 - 1.0 K/uL   Eosinophils Relative 0 %   Eosinophils Absolute 0.0 0.0 - 0.5 K/uL   Basophils Relative 1 %   Basophils Absolute 0.0 0.0 - 0.1 K/uL   Immature Granulocytes 1 %   Abs Immature Granulocytes 0.07 0.00 - 0.07 K/uL    Comment: Performed at [facility], [place] [address] [place], [place] [address]  Comprehensive metabolic panel     Status: Abnormal   Collection Time: 09/03/19  6:48 PM  Result Value Ref Range   Sodium 137 135 - 145 mmol/L   Potassium 4.1 3.5 - 5.1 mmol/L   Chloride 103 98 - 111 mmol/L   CO2 22 22 - 32 mmol/L   Glucose, Bld 122 (H) 70 - 99 mg/dL   BUN 26 (H) 8 - 23 mg/dL   Creatinine, Ser 0.82 0.61 - 1.24 mg/dL   Calcium 8.8 (L) 8.9 - 10.3 mg/dL   Total Protein 8.0 6.5 - 8.1 g/dL   Albumin 3.5 3.5 - 5.0 g/dL   AST 51 (H) 15 - 41 U/L   ALT 58 (H) 0 - 44 U/L   Alkaline Phosphatase 39 38 - 126 U/L   Total Bilirubin 0.8 0.3 - 1.2 mg/dL   GFR calc non Af Amer >60 >60 mL/min   GFR calc Af Amer >60 >60 mL/min   Anion gap 12 5 - 15    Comment: Performed at [facility], [place] [address] [place], [place] [address]  D-dimer, quantitative     Status: Abnormal   Collection Time: 09/03/19  6:48 PM  Result Value Ref Range   D-Dimer, Quant 3.31 (H) 0.00 - 0.50 ug/mL-FEU    Comment: (NOTE) At the manufacturer cut-off of 0.50 ug/mL FEU, this assay has been documented to exclude PE with a sensitivity and negative predictive value of 97 to 99%.  At this time, this assay has not been approved by the FDA to exclude DVT/VTE. Results should be correlated with clinical presentation. Performed at [facility], [place] [address] [place], [place] [address]   Procalcitonin     Status: None   Collection Time: 09/03/19  6:48 PM  Result Value Ref Range   Procalcitonin <0.10 ng/mL    Comment:        Interpretation: PCT (Procalcitonin) <= 0.5  ng/mL: Systemic infection (sepsis) is not likely. Local  bacterial infection is possible. (NOTE)       Sepsis PCT Algorithm           Lower Respiratory Tract                                      Infection PCT Algorithm    ----------------------------     ----------------------------         PCT < 0.25 ng/mL                PCT < 0.10 ng/mL         Strongly encourage             Strongly discourage   discontinuation of antibiotics    initiation of antibiotics    ----------------------------     -----------------------------       PCT 0.25 - 0.50 ng/mL            PCT 0.10 - 0.25 ng/mL               OR       >80% decrease in PCT            Discourage initiation of                                            antibiotics      Encourage discontinuation           of antibiotics    ----------------------------     -----------------------------         PCT >= 0.50 ng/mL              PCT 0.26 - 0.50 ng/mL               AND        <80% decrease in PCT             Encourage initiation of                                             antibiotics       Encourage continuation           of antibiotics    ----------------------------     -----------------------------        PCT >= 0.50 ng/mL                  PCT > 0.50 ng/mL               AND         increase in PCT                  Strongly encourage                                      initiation of antibiotics    Strongly encourage escalation           of antibiotics                                     -----------------------------  PCT <= 0.25 ng/mL                                                 OR                                        > 80% decrease in PCT                                     Discontinue / Do not initiate                                             antibiotics Performed at [facility] [address], [place], [place] [address]   Lactate dehydrogenase     Status:  Abnormal   Collection Time: 09/03/19  6:48 PM  Result Value Ref Range   LDH 413 (H) 98 - 192 U/L    Comment: Performed at [facility], [place] [address], [place], [place] [address]  Triglycerides     Status: None   Collection Time: 09/03/19  6:48 PM  Result Value Ref Range   Triglycerides 83 <150 mg/dL    Comment: Performed at [facility], [place] [address] [place], [place] [address]  Fibrinogen     Status: Abnormal   Collection Time: 09/03/19  6:48 PM  Result Value Ref Range   Fibrinogen 714 (H) 210 - 475 mg/dL    Comment: Performed at [facility], [place] [address] [place], [place] [address]  POC SARS Coronavirus 2 Ag-ED - Nasal Swab (BD Veritor Kit)     Status: Abnormal   Collection Time: 09/03/19  7:07 PM  Result Value Ref Range   SARS Coronavirus 2 Ag POSITIVE (A) NEGATIVE    Comment: (NOTE) SARS-CoV-2 antigen PRESENT. Positive results indicate the presence of viral antigens, but clinical correlation with patient history and other diagnostic information is necessary to determine patient infection status.  Positive results do not rule out bacterial infection or co-infection  with other viruses. False positive results are rare but can occur, and confirmatory RT-PCR testing may be appropriate in some circumstances. The expected result is Negative. Fact Sheet for Patients: PodPark.tn Fact Sheet for Providers: GiftContent.is  This test is not yet approved or cleared by the [place] FDA and  has been authorized for detection and/or diagnosis of SARS-CoV-2 by FDA under an Emergency Use Authorization (EUA).  This EUA will remain in effect (meaning this test can be used) for the duration of  the COVID-19 declaration under Section 564(b)(1) of the Act, 21 U.S.C. section 360bbb-3(b)(1), unless the a uthorization is terminated or revoked sooner.    CT ANGIO CHEST PE W OR  WO CONTRAST  Result Date: 09/04/2019 CLINICAL DATA:  COVID positive.  Shortness of breath EXAM: CT ANGIOGRAPHY CHEST WITH CONTRAST TECHNIQUE: Multidetector CT imaging of the chest was performed using the standard protocol during bolus administration of intravenous contrast. Multiplanar CT image reconstructions and MIPs were obtained to evaluate the vascular anatomy. CONTRAST:  56m OMNIPAQUE IOHEXOL 350  MG/ML SOLN COMPARISON:  09/03/2019 chest x-ray FINDINGS: Cardiovascular: Filling defects are seen in the lower lobe pulmonary arteries bilaterally compatible with segmental and subsegmental pulmonary emboli. No evidence of right heart strain. Heart is borderline in size. Aorta is normal caliber. Mediastinum/Nodes: Small hiatal hernia. Small scattered reactive mediastinal lymph nodes. No mediastinal, hilar, or axillary adenopathy. Thyroid unremarkable. Lungs/Pleura: Extensive bilateral ground-glass airspace opacities within the lungs. No effusions. Upper Abdomen: Imaging into the upper abdomen shows no acute findings. Musculoskeletal: Chest wall soft tissues are unremarkable. No acute bony abnormality. Review of the MIP images confirms the above findings. IMPRESSION: Small bilateral segmental and subsegmental pulmonary emboli in the lower lobes. No evidence of right heart strain. Extensive bilateral ground-glass airspace opacities compatible with COVID pneumonia. Small hiatal hernia. Electronically Signed   By: Rolm Baptise M.D.   On: 09/04/2019 00:53   DG Chest Port 1 View  Result Date: 09/03/2019 CLINICAL DATA:  COVID-19 pneumonia EXAM: PORTABLE CHEST 1 VIEW COMPARISON:  05/25/2011 FINDINGS: There are diffuse bilateral hazy airspace opacities consistent with the patient's history of COVID-19 pneumonia. The lung volumes are low. The heart size is normal. There may be trace to small bilateral pleural effusions. There is no acute osseous abnormality. IMPRESSION: Hazy bilateral airspace opacities consistent with  the patient's history of viral pneumonia. Electronically Signed   By: Constance Holster M.D.   On: 09/03/2019 19:24    Pending Labs Unresulted Labs (From admission, onward)    Start     Ordered   09/03/19 1828  Urinalysis, Routine w reflex microscopic  Once,   STAT     09/03/19 1827   09/03/19 1827  Ferritin  Once,   STAT     09/03/19 1827   09/03/19 1827  C-reactive protein  Once,   STAT     09/03/19 1827   Signed and Held  HIV Antibody (routine testing w rflx)  (HIV Antibody (Routine testing w reflex) panel)  Once,   R     Signed and Held   Signed and Held  ABO/Rh  Once,   R     Signed and Held   Signed and Held  CBC  (enoxaparin (LOVENOX)    CrCl >/= 30 ml/min)  Once,   R    Comments: Baseline for enoxaparin therapy IF NOT ALREADY DRAWN.  Notify MD if PLT < 100 K.    Signed and Held   Signed and Held  Creatinine, serum  (enoxaparin (LOVENOX)    CrCl >/= 30 ml/min)  Weekly,   R    Comments: while on enoxaparin therapy    Signed and Held   Signed and Held  Comprehensive metabolic panel  Daily,   R     Signed and Held   Signed and Held  CBC with Differential/Platelet  Daily,   R     Signed and Held   Signed and Held  Procalcitonin  Once,   R     Signed and Held          Vitals/Pain Today's Vitals   09/04/19 0042 09/04/19 0052 09/04/19 0055 09/04/19 0100  BP: 120/68   117/71  Pulse: 72 68 66 66  Resp: (!) _0 Temp:      TempSrc:      SpO2: 90% (!) 89% 90% 91%  PainSc:        Isolation Precautions Airborne and Contact precautions  Medications Medications  remdesivir 200 mg in sodium chloride 0.9% 250 mL IVPB (  0 mg Intravenous Stopped 09/03/19 2217)    Followed by  remdesivir 100 mg in sodium chloride 0.9 % 100 mL IVPB (has no administration in time range)  dexamethasone (DECADRON) injection 10 mg (10 mg Intravenous Given 09/03/19 2052)  iohexol (OMNIPAQUE) 350 MG/ML injection 80 mL (80 mLs Intravenous Contrast Given 09/04/19 0048)    Mobility walks  with device Moderate fall risk   Focused Assessments Cardiac Assessment Handoff:    No results found for: CKTOTAL, CKMB, CKMBINDEX, TROPONINI Lab Results  Component Value Date   DDIMER 3.31 (H) 09/03/2019   Does the Patient currently have chest pain? no    R Recommendations: See Admitting Provider Note  Report given to:   Additional Notes: n/a

## 2019-09-04 NOTE — Progress Notes (Signed)
0120 Report received from ED nurse Charlynne Pander, Rn  [phone] Patient arrived in facility. The patient is alert and verbal. Able to communicate needs and wants. Pt on 20 at 6L via [place], Denies pain. The patient is amubulatory and on tele. Tested positive on Friday. Poor appetite. cxr shows bilat PNA, and Pulmonary Emboli to lower lobe. The patient is aware.

## 2019-09-04 NOTE — Progress Notes (Signed)
PROGRESS NOTE                                                                                                                                                                                                             Patient Demographics:    Roy Lee, is a 64 y.o. male, DOB - 03-20-1956, JLU:[id number]  Outpatient Primary MD for the patient is Carol Ada, MD    LOS - 1  Admit date - 09/03/2019    Chief Complaint  Patient presents with  . COVID POSITIVE  . Shortness of Breath       Brief Narrative  Roy Lee is a 64 y.o. male with medical history significant of essential hypertension, chronic arthritis and obesity.  Patient presents to the emergency room 4 days after testing positive for Covid with worsening shortness of breath.   Subjective:    Roy Lee today has, No headache, No chest pain, No abdominal pain - No Nausea, No new weakness tingling or numbness, positive shortness of breath   Assessment  & Plan :     1. Acute Hypoxic Resp. Failure due to Acute Covid 19 Viral Pneumonitis during the ongoing 2020 Covid 19 Pandemic - he has severe disease, has been started on IV steroids and remdesivir, Actemra after consent was given on 09/04/2019.  Continue to monitor closely.  Encouraged the patient to sit up in chair in the daytime use I-S and flutter valve for pulmonary toiletry and then prone in bed when at night.  Actemra off label use - patient was told that if COVID-19 pneumonitis gets worse we might potentially use Actemra off label, patient denies any known history of tuberculosis or hepatitis, understands the risks and benefits and wants to proceed with Actemra treatment if required.    SpO2: 91 % O2 Flow Rate (L/min): 10 L/min  Recent Labs  Lab 09/03/19 1848 09/04/19 0725  CRP 19.6* 20.0*  DDIMER 3.31* 6.78*  FERRITIN 969*  --   BNP  --  60.7  PROCALCITON <0.10 <0.10    Hepatic  Function Latest Ref Rng & Units 09/03/2019  Total Protein 6.5 - 8.1 g/dL 8.0  Albumin 3.5 - 5.0 g/dL 3.5  AST 15 - 41 U/L 51(H)  ALT 0 - 44 U/L 58(H)  Alk Phosphatase 38 - 126 U/L 39  Total Bilirubin 0.3 -  1.2 mg/dL 0.8      2.  Bilateral PEs.  Seem to be hemodynamically not significant.  Full dose Lovenox.  3.  Essential hypertension.  Currently stable on combination of ACE inhibitor and Norvasc will monitor.  4.  Obesity.  BMI of 38.  Follow with PCP for weight loss.    Condition -   Guarded  Family Communication  :  None  Code Status :  Full  Diet :   Diet Order            Diet Heart Room service appropriate? Yes; Fluid consistency: Thin  Diet effective now               Disposition Plan  : Stay inpatient, still severely hypoxic  Consults  : None  Procedures  :    CT angiogram. Small bilateral segmental and subsegmental pulmonary emboli in the lower lobes. No evidence of right heart strain. Extensive bilateral ground-glass airspace opacities compatible with COVID pneumonia. Small hiatal hernia.  PUD Prophylaxis : None  DVT Prophylaxis  :  Lovenox    Lab Results  Component Value Date   PLT 238 09/04/2019    Inpatient Medications  Scheduled Meds: . albuterol  2 puff Inhalation Q6H  . amLODipine  5 mg Oral Daily  . vitamin C  500 mg Oral Daily  . aspirin  81 mg Oral Daily  . enoxaparin (LOVENOX) injection  120 mg Subcutaneous Q12H  . latanoprost  1 drop Both Eyes Daily  . lisinopril  40 mg Oral Daily  . methylPREDNISolone (SOLU-MEDROL) injection  60 mg Intravenous Q12H  . omega-3 acid ethyl esters  1 g Oral Daily  . zinc sulfate  220 mg Oral Daily   Continuous Infusions: . sodium chloride Stopped (09/04/19 0658)  . remdesivir 100 mg in NS 100 mL 100 mg (09/04/19 0946)  . tocilizumab (ACTEMRA) - non-COVID treatment 800 mg (09/04/19 1122)   PRN Meds:.acetaminophen, alum & mag hydroxide-simeth, chlorpheniramine-HYDROcodone,  guaiFENesin-dextromethorphan, [DISCONTINUED] ondansetron **OR** ondansetron (ZOFRAN) IV, senna-docusate  Antibiotics  :    Anti-infectives (From admission, onward)   Start     Dose/Rate Route Frequency Ordered Stop   09/05/19 1000  remdesivir 100 mg in sodium chloride 0.9 % 100 mL IVPB  Status:  Discontinued     100 mg 200 mL/hr over 30 Minutes Intravenous Daily 09/04/19 0637 09/04/19 0646   09/04/19 1000  remdesivir 100 mg in sodium chloride 0.9 % 100 mL IVPB     100 mg 200 mL/hr over 30 Minutes Intravenous Daily 09/03/19 2115 09/08/19 0959   09/04/19 0645  remdesivir 200 mg in sodium chloride 0.9% 250 mL IVPB  Status:  Discontinued     200 mg 580 mL/hr over 30 Minutes Intravenous Once 09/04/19 1610 09/04/19 0646   09/03/19 2200  remdesivir 200 mg in sodium chloride 0.9% 250 mL IVPB     200 mg 580 mL/hr over 30 Minutes Intravenous Once 09/03/19 2115 09/03/19 2217       Time Spent in minutes  30   Lala Lund M.D on 09/04/2019 at 11:27 AM  To page go to [email or web address] - password [facility]  [facility] -  Office  [phone]    See all Orders from today for further details    Objective:   Vitals:   09/04/19 0150 09/04/19 0400 09/04/19 0742 09/04/19 0800  BP: 118/76 117/80 119/77   Pulse: 64  64 66  Resp: 16  (!) 27 (!) 33  Temp: 98.2 F (  36.8 C) 98.5 F (36.9 C)    TempSrc: Oral Oral    SpO2:   90% 91%  Weight: 124.5 kg     Height: '5\' 11"'  (1.803 m)       Wt Readings from Last 3 Encounters:  09/04/19 124.5 kg  12/18/15 129.7 kg     Intake/Output Summary (Last 24 hours) at 09/04/2019 1127 Last data filed at 09/03/2019 2217 Gross per 24 hour  Intake 250 ml  Output --  Net 250 ml     Physical Exam  Awake Alert,   No new F.N deficits, Normal affect [place].AT,PERRAL Supple Neck,No JVD, No cervical lymphadenopathy appriciated.  Symmetrical Chest wall movement, Good air movement bilaterally, CTAB RRR,No Gallops,Rubs or new Murmurs, No Parasternal Heave +ve  B.Sounds, Abd Soft, No tenderness, No organomegaly appriciated, No rebound - guarding or rigidity. No Cyanosis, Clubbing or edema, No new Rash or bruise       Data Review:    CBC Recent Labs  Lab 09/03/19 1848 09/04/19 0725  WBC 6.5 4.4  HGB 15.2 13.5  HCT 44.2 39.9  PLT 263 238  MCV 86.3 86.2  MCH 29.7 29.2  MCHC 34.4 33.8  RDW 14.1 14.0  LYMPHSABS 0.9  --   MONOABS 0.6  --   EOSABS 0.0  --   BASOSABS 0.0  --     Chemistries  Recent Labs  Lab 09/03/19 1848 09/04/19 0725  NA 137 138  K 4.1 4.3  CL 103 106  CO2 22 23  GLUCOSE 122* 152*  BUN 26* 26*  CREATININE 0.82 0.71  CALCIUM 8.8* 8.5*  MG  --  2.4  AST 51*  --   ALT 58*  --   ALKPHOS 39  --   BILITOT 0.8  --    ------------------------------------------------------------------------------------------------------------------ Recent Labs    09/03/19 1848  TRIG 83    No results found for: HGBA1C ------------------------------------------------------------------------------------------------------------------ No results for input(s): TSH, T4TOTAL, T3FREE, THYROIDAB in the last 72 hours.  Invalid input(s): FREET3  Cardiac Enzymes No results for input(s): CKMB, TROPONINI, MYOGLOBIN in the last 168 hours.  Invalid input(s): CK ------------------------------------------------------------------------------------------------------------------    Component Value Date/Time   BNP 60.7 09/04/2019 0725    Micro Results Recent Results (from the past 240 hour(s))  Blood Culture (routine x 2)     Status: None (Preliminary result)   Collection Time: 09/03/19  6:48 PM   Specimen: BLOOD  Result Value Ref Range Status   Specimen Description BLOOD RIGHT ANTECUBITAL  Final   Special Requests   Final    BOTTLES DRAWN AEROBIC AND ANAEROBIC Blood Culture results may not be optimal due to an excessive volume of blood received in culture bottles Performed at [facility], [place] [address]  [place], [place] [address]    Culture   Final    NO GROWTH < 12 HOURS Performed at [facility] [address], [place], [place] [address]    Report Status PENDING  Incomplete  Blood Culture (routine x 2)     Status: None (Preliminary result)   Collection Time: 09/03/19  6:48 PM   Specimen: BLOOD RIGHT FOREARM  Result Value Ref Range Status   Specimen Description BLOOD RIGHT FOREARM  Final   Special Requests   Final    BOTTLES DRAWN AEROBIC AND ANAEROBIC Blood Culture adequate volume Performed at [facility] [address], [place], [place] [address]    Culture   Final    NO GROWTH <  12 HOURS Performed at [facility], [place] [address] [place],  [address]    Report Status PENDING  Incomplete    Radiology Reports CT ANGIO CHEST PE W OR WO CONTRAST  Result Date: 09/04/2019 CLINICAL DATA:  COVID positive.  Shortness of breath EXAM: CT ANGIOGRAPHY CHEST WITH CONTRAST TECHNIQUE: Multidetector CT imaging of the chest was performed using the standard protocol during bolus administration of intravenous contrast. Multiplanar CT image reconstructions and MIPs were obtained to evaluate the vascular anatomy. CONTRAST:  6m OMNIPAQUE IOHEXOL 350 MG/ML SOLN COMPARISON:  09/03/2019 chest x-ray FINDINGS: Cardiovascular: Filling defects are seen in the lower lobe pulmonary arteries bilaterally compatible with segmental and subsegmental pulmonary emboli. No evidence of right heart strain. Heart is borderline in size. Aorta is normal caliber. Mediastinum/Nodes: Small hiatal hernia. Small scattered reactive mediastinal lymph nodes. No mediastinal, hilar, or axillary adenopathy. Thyroid unremarkable. Lungs/Pleura: Extensive bilateral ground-glass airspace opacities within the lungs. No effusions. Upper Abdomen: Imaging into the upper abdomen shows no acute findings. Musculoskeletal: Chest wall soft tissues are unremarkable. No acute bony abnormality. Review of the MIP images  confirms the above findings. IMPRESSION: Small bilateral segmental and subsegmental pulmonary emboli in the lower lobes. No evidence of right heart strain. Extensive bilateral ground-glass airspace opacities compatible with COVID pneumonia. Small hiatal hernia. Electronically Signed   By: KRolm BaptiseM.D.   On: 09/04/2019 00:53   DG Chest Port 1 View  Result Date: 09/03/2019 CLINICAL DATA:  COVID-19 pneumonia EXAM: PORTABLE CHEST 1 VIEW COMPARISON:  05/25/2011 FINDINGS: There are diffuse bilateral hazy airspace opacities consistent with the patient's history of COVID-19 pneumonia. The lung volumes are low. The heart size is normal. There may be trace to small bilateral pleural effusions. There is no acute osseous abnormality. IMPRESSION: Hazy bilateral airspace opacities consistent with the patient's history of viral pneumonia. Electronically Signed   By: CConstance HolsterM.D.   On: 09/03/2019 19:24

## 2019-09-04 NOTE — Progress Notes (Signed)
Pt wife called & updated.

## 2019-09-04 NOTE — Progress Notes (Signed)
ANTICOAGULATION CONSULT NOTE  Pharmacy Consult for Lovenox Indication: pulmonary embolus  No Known Allergies  Patient Measurements: Height: 5\' 11"  (180.3 cm) Weight: 274 lb 7.6 oz (124.5 kg) IBW/kg (Calculated) : 75.3 Heparin Dosing Weight: 103.2 kg  Vital Signs: Temp: 98.5 F (36.9 C) (01/25 0400) Temp Source: Oral (01/25 0400) BP: 119/77 (01/25 0742) Pulse Rate: 66 (01/25 0800)  Labs: Recent Labs    09/03/19 1848 09/04/19 0725  HGB 15.2 13.5  HCT 44.2 39.9  PLT 263 238  CREATININE 0.82 0.71    Estimated Creatinine Clearance: 130.3 mL/min (by C-G formula based on SCr of 0.71 mg/dL).   Medical History: Past Medical History:  Diagnosis Date  . Arthritis   . Hypertension   . Obesity     Medications:  Scheduled:  . albuterol  2 puff Inhalation Q6H  . amLODipine  5 mg Oral Daily  . vitamin C  500 mg Oral Daily  . aspirin  81 mg Oral Daily  . enoxaparin (LOVENOX) injection  120 mg Subcutaneous Q12H  . enoxaparin (LOVENOX) injection  80 mg Subcutaneous NOW  . latanoprost  1 drop Both Eyes Daily  . lisinopril  40 mg Oral Daily  . methylPREDNISolone (SOLU-MEDROL) injection  60 mg Intravenous Q12H  . omega-3 acid ethyl esters  1 g Oral Daily  . zinc sulfate  220 mg Oral Daily    Assessment: 65 y/o M admitted with COVID-19 PNA. CTA chest revealed PE. Patient received 40 mg Lovenox this AM.   Goal of Therapy:  Anti-Xa level 0.6-1 units/ml 4hrs after LMWH dose given Monitor platelets by anticoagulation protocol: Yes   Plan:  -Lovenox 80 mg (in addition to 40 mg already administered) x 1 then 120 mg bid - Monitor renal function, CBC, s/s of bleeding -Levels if indicated  77 D 09/04/2019,9:15 AM

## 2019-09-04 NOTE — ED Notes (Signed)
[facility] 

## 2019-09-05 LAB — CBC WITH DIFFERENTIAL/PLATELET
Abs Immature Granulocytes: 0.12 10*3/uL — ABNORMAL HIGH (ref 0.00–0.07)
Basophils Absolute: 0 10*3/uL (ref 0.0–0.1)
Basophils Relative: 0 %
Eosinophils Absolute: 0 10*3/uL (ref 0.0–0.5)
Eosinophils Relative: 0 %
HCT: 39.7 % (ref 39.0–52.0)
Hemoglobin: 13.4 g/dL (ref 13.0–17.0)
Immature Granulocytes: 1 %
Lymphocytes Relative: 9 %
Lymphs Abs: 1.1 10*3/uL (ref 0.7–4.0)
MCH: 29 pg (ref 26.0–34.0)
MCHC: 33.8 g/dL (ref 30.0–36.0)
MCV: 85.9 fL (ref 80.0–100.0)
Monocytes Absolute: 0.4 10*3/uL (ref 0.1–1.0)
Monocytes Relative: 4 %
Neutro Abs: 10 10*3/uL — ABNORMAL HIGH (ref 1.7–7.7)
Neutrophils Relative %: 86 %
Platelets: 286 10*3/uL (ref 150–400)
RBC: 4.62 MIL/uL (ref 4.22–5.81)
RDW: 13.8 % (ref 11.5–15.5)
WBC: 11.7 10*3/uL — ABNORMAL HIGH (ref 4.0–10.5)
nRBC: 0 % (ref 0.0–0.2)

## 2019-09-05 LAB — COMPREHENSIVE METABOLIC PANEL
ALT: 67 U/L — ABNORMAL HIGH (ref 0–44)
AST: 38 U/L (ref 15–41)
Albumin: 2.8 g/dL — ABNORMAL LOW (ref 3.5–5.0)
Alkaline Phosphatase: 35 U/L — ABNORMAL LOW (ref 38–126)
Anion gap: 10 (ref 5–15)
BUN: 36 mg/dL — ABNORMAL HIGH (ref 8–23)
CO2: 22 mmol/L (ref 22–32)
Calcium: 8.9 mg/dL (ref 8.9–10.3)
Chloride: 103 mmol/L (ref 98–111)
Creatinine, Ser: 0.84 mg/dL (ref 0.61–1.24)
GFR calc Af Amer: >60 mL/min (ref >60)
GFR calc non Af Amer: >60 mL/min (ref >60)
Glucose, Bld: 214 mg/dL — ABNORMAL HIGH (ref 70–99)
Potassium: 4.5 mmol/L (ref 3.5–5.1)
Sodium: 135 mmol/L (ref 135–145)
Total Bilirubin: 0.4 mg/dL (ref 0.3–1.2)
Total Protein: 6.8 g/dL (ref 6.5–8.1)

## 2019-09-05 LAB — PROCALCITONIN: Procalcitonin: <0.1 ng/mL

## 2019-09-05 LAB — D-DIMER, QUANTITATIVE: D-Dimer, Quant: 7.02 ug/mL-FEU — ABNORMAL HIGH (ref 0.00–0.50)

## 2019-09-05 LAB — HEPATITIS B SURFACE ANTIBODY, QUANTITATIVE: Hep B S AB Quant (Post): 5.3 m[IU]/mL — ABNORMAL LOW (ref >9.9)

## 2019-09-05 LAB — BRAIN NATRIURETIC PEPTIDE: B Natriuretic Peptide: 48 pg/mL (ref 0.0–100.0)

## 2019-09-05 LAB — C-REACTIVE PROTEIN: CRP: 14.8 mg/dL — ABNORMAL HIGH (ref <1.0)

## 2019-09-05 LAB — MAGNESIUM: Magnesium: 2.2 mg/dL (ref 1.7–2.4)

## 2019-09-05 MED ORDER — LACTATED RINGERS IV SOLN: INTRAVENOUS | Status: AC

## 2019-09-05 NOTE — Plan of Care (Signed)

## 2019-09-05 NOTE — TOC Initial Note (Addendum)
Transition of Care [facility]) - Initial/Assessment Note    Patient Details  Name: Roy Lee MRN: [id number] Date of Birth: Aug 31, 1955  [facility] CM/SW Contact:    Armanda Heritage, RN Phone Number: 09/05/2019, 2:58 PM  Clinical Narrative: CM received message from OT stating patient reported he was living in a shed.  Cm spoke with patient who confirms that he does live in a shed which he describes as a renovated garage and that this is by choice.  Patient reports that this is by choice and he feels safe in this living arrangement, reports he has enough food, and is in a safe and warm place.  Patient reports he feels safe to return to this living arrangement and states does not need any housing resources.  Patient does report he has a daughter with whom he can stay if needed, and states he has thought about making those arrangements on a temporary basis when he is ready to leave the hospital but hasn't made any set plans.  Cm questioned if patient returns to his shed if he will be able to plug in medical devices such as oxygen if that was needed at discharge.  Patient states that he can do that in the shed but also states he can make other arrangements to stay with someone else if he needs to.  CM asked if there was any way that CM/SW can help the patient and he stated no.  No further CM needs at this time.                 Expected Discharge Plan: Home/Self Care Barriers to Discharge: Continued Medical Work up   Patient Goals and CMS Choice Patient states their goals for this hospitalization and ongoing recovery are:: to go home      Expected Discharge Plan and Services Expected Discharge Plan: Home/Self Care       Living arrangements for the past 2 months: Apartment(reports lives in a shed by choice, see CM note)                                      Prior Living Arrangements/Services Living arrangements for the past 2 months: Apartment(reports lives in a  shed by choice, see CM note) Lives with:: Self Patient language and need for interpreter reviewed:: Yes Do you feel safe going back to the place where you live?: Yes      Need for Family Participation in Patient Care: Yes (Comment) Care giver support system in place?: Yes (comment)   Criminal Activity/Legal Involvement Pertinent to Current Situation/Hospitalization: No - Comment as needed  Activities of Daily Living Home Assistive Devices/Equipment: None ADL Screening (condition at time of admission) Patient's cognitive ability adequate to safely complete daily activities?: Yes Is the patient deaf or have difficulty hearing?: No Does the patient have difficulty seeing, even when wearing glasses/contacts?: No Does the patient have difficulty concentrating, remembering, or making decisions?: No Patient able to express need for assistance with ADLs?: Yes Does the patient have difficulty dressing or bathing?: No Independently performs ADLs?: Yes (appropriate for developmental age) Does the patient have difficulty walking or climbing stairs?: No Weakness of Legs: None Weakness of Arms/Hands: None  Permission Sought/Granted                  Emotional Assessment       Orientation: : Oriented to Self, Oriented  to Place, Oriented to  Time, Oriented to Situation   Psych Involvement: No (comment)  Admission diagnosis:  Acute respiratory failure with hypoxia (HCC) [J96.01] Pneumonia due to COVID-19 virus [U07.1, J12.82] COVID-19 [U07.1] Patient Active Problem List   Diagnosis Date Noted  . Pneumonia due to COVID-19 virus 09/03/2019   PCP:  Carol Ada, MD Pharmacy:   [facility] [place] [address] Phone: [phone] Fax: [phone]     Social Determinants of Health (SDOH) Interventions    Readmission Risk Interventions No flowsheet data found.

## 2019-09-05 NOTE — Evaluation (Signed)
Physical Therapy Evaluation Patient Details Name: Roy Lee MRN: [id number] DOB: August 27, 1955 Today's Date: 09/05/2019   History of Present Illness  Pt is a 64 y.o. male admitted 09/03/19 with worsening SOB, had tested (+) for COVID-19 four days prior. Worked up for acute hypoxic respiratory failure secondary to COVID-19 viral pneumonitis requiring HFNC. CT angiogram with small bilateral PEs, no evidence of heart strain. PMH includes HTN, arthritis, obesity.    Clinical Impression  Pt presents with an overall decrease in functional mobility secondary to above. PTA, pt independent, lives alone, works and volunteers for [employer]. Today, pt moving well, although limited by decreased activity tolerance. SpO2 down to 80% on 8L HFNC with ambulation. Educ on precautions, positioning, therex, and importance of mobility. Pt would benefit from continued acute PT services to maximize functional mobility and independence prior to d/c home.     Follow Up Recommendations No PT follow up    Equipment Recommendations  None recommended by PT    Recommendations for Other Services       Precautions / Restrictions Precautions Precautions: Other (comment) Precaution Comments: Watch SpO2 Restrictions Weight Bearing Restrictions: No      Mobility  Bed Mobility               General bed mobility comments: Received sitting in recliner  Transfers Overall transfer level: Independent Equipment used: None                Ambulation/Gait Ambulation/Gait assistance: Supervision Gait Distance (Feet): 100 Feet Assistive device: None Gait Pattern/deviations: Step-through pattern;Decreased stride length   Gait velocity interpretation: 1.31 - 2.62 ft/sec, indicative of limited community ambulator General Gait Details: Independent with ambulation, only requiring supervision for lines/monitoring SpO2. Amb 50' with SpO2 down to 76% on 6L HFNC, returning to 93% with seated rest and deep  breathing. Amb additional 50' with SpO2 down to 80% on 8L HFNC, returning to 93% with seated rest  Stairs            Wheelchair Mobility    Modified Rankin (Stroke Patients Only)       Balance Overall balance assessment: No apparent balance deficits (not formally assessed)                                           Pertinent Vitals/Pain Pain Assessment: No/denies pain    Home Living Family/patient expects to be discharged to:: Private residence Living Arrangements: Alone Available Help at Discharge: Family;Available PRN/intermittently Type of Home: Other(Comment)(shed)       Home Layout: One level Home Equipment: Walker - 2 wheels Additional Comments: May be able to d/c to daughter's house, or set up hotel/airbnb if needed. Lives in refurnished shed with heat and electricity, showers at fire station    Prior Function Level of Independence: Independent         Comments: Works full-time in [employer]; also works as Engineer, water. Plans to get pulse ox     Hand Dominance   Dominant Hand: Right    Extremity/Trunk Assessment   Upper Extremity Assessment Upper Extremity Assessment: Overall WFL for tasks assessed    Lower Extremity Assessment Lower Extremity Assessment: Overall WFL for tasks assessed(H/o 3x L THA, L ankle fixation, in need of L TKA)    Cervical / Trunk Assessment Cervical / Trunk Assessment: Normal  Communication   Communication: No difficulties  Cognition Arousal/Alertness: Awake/alert Behavior During Therapy: WFL for tasks assessed/performed Overall Cognitive Status: Within Functional Limits for tasks assessed                                        General Comments General comments (skin integrity, edema, etc.): Pt with good demo of pursed lip breathing (was educ by OT earlier today), also with good technique of flutter valve and IS. Pt cannot tolerate prone but can handle  sidelying - educ on either upright in chair or sidelying in bed    Exercises Other Exercises Other Exercises: Encouraged hourly standing mobility within limits of lines/O2 - repeated sit<>stands, marching in place, repeated steps forwards/backwards   Assessment/Plan    PT Assessment Patient needs continued PT services  PT Problem List Decreased activity tolerance;Decreased mobility;Cardiopulmonary status limiting activity       PT Treatment Interventions DME instruction;Gait training;Stair training;Functional mobility training;Therapeutic activities;Therapeutic exercise;Balance training;Patient/family education    PT Goals (Current goals can be found in the Care Plan section)  Acute Rehab PT Goals Patient Stated Goal: to get better PT Goal Formulation: With patient Time For Goal Achievement: 09/19/19 Potential to Achieve Goals: Good    Frequency Min 3X/week   Barriers to discharge        Co-evaluation               AM-PAC PT "6 Clicks" Mobility  Outcome Measure Help needed turning from your back to your side while in a flat bed without using bedrails?: None Help needed moving from lying on your back to sitting on the side of a flat bed without using bedrails?: None Help needed moving to and from a bed to a chair (including a wheelchair)?: None Help needed standing up from a chair using your arms (e.g., wheelchair or bedside chair)?: None Help needed to walk in hospital room?: None Help needed climbing 3-5 steps with a railing? : None 6 Click Score: 24    End of Session Equipment Utilized During Treatment: Oxygen Activity Tolerance: Patient tolerated treatment well Patient left: in chair;with call bell/phone within reach Nurse Communication: Mobility status PT Visit Diagnosis: Other abnormalities of gait and mobility (R26.89)    Time: 7341-9379 PT Time Calculation (min) (ACUTE ONLY): 27 min   Charges:   PT Evaluation $PT Eval Moderate Complexity: 1 Mod PT  Treatments $Therapeutic Exercise: 8-22 mins   Mabeline Caras, PT, DPT Acute Rehabilitation Services  Pager [phone] Office [place] 09/05/2019, 5:32 PM

## 2019-09-05 NOTE — Progress Notes (Signed)
OT Evaluation  PTA, pt was independent, worked full-time in Press photographer and a Museum/gallery conservator. Pt states he lives in a shed by choice ( has some type of heater/cooling system) and showers at the fire department because he does not have running water. Pt became emotional during session at one time when talking about possibility of staying with his daughter. Able to ambulate 60 ft on 10L with desat to 84 and 2/4 DOE. Min A with LB ADL - may benefit from AE. Will follow acutely to facilitate safe DC. Discussed with CM and MD.     09/05/19 1100  OT Visit Information  Last OT Received On 09/05/19  Assistance Needed +1  History of Present Illness 64 y.o. male with medical history significant of essential hypertension, chronic arthritis and obesity.  Patient presents to the emergency room 4 days after testing positive for Covid with worsening shortness of breath.  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; Airline pilot  Pain Assessment  Pain Assessment No/denies pain  Cognition  Arousal/Alertness Awake/alert  Behavior During Therapy  (emotional)  Overall Cognitive Status Within Functional Limits for tasks assessed  Upper Extremity Assessment  Upper Extremity Assessment Generalized weakness (arthirtic pain)  Lower Extremity Assessment  Lower Extremity Assessment Defer to PT evaluation (statehe needs surgeryo L knee and L ankle)  Cervical / Trunk Assessment  Cervical / Trunk Assessment Normal   ADL  Overall ADL's  Needs assistance/impaired  Grooming Set up  Upper Body Bathing Set up;Sitting  Lower Body Bathing Minimal assistance;Sit to/from stand  Upper Body Dressing  Set up;Sitting  Lower Body Dressing Minimal assistance;Sit to/from Retail buyer Ambulation;Supervision/safety  Toileting- Water quality scientist and Hygiene Modified independent  Functional mobility during ADLs Supervision/safety  General ADL Comments More difficulty with socks/shoes. May benefit from AE  Vision- History  Baseline Vision/History No visual deficits;Wears glasses  Bed Mobility  Overal bed mobility Independent  Transfers  Overall transfer level Needs assistance  Transfers Sit to/from Stand  Sit to Stand Supervision  General Comments  General comments (skin integrity, edema, etc.) Ambulated 60 ft on 10L 2/4 DOE  Exercises  Exercises Other exercises  Other Exercises  Other Exercises flutter valve x 10   Other Exercises incentive spirometer x 10  OT - End of Session  Equipment Utilized During Treatment Oxygen (10 L with ambulation)  Activity Tolerance Patient tolerated treatment well  Patient left in chair;with call bell/phone within reach  Nurse Communication Mobility status  OT Assessment  OT Recommendation/Assessment Patient needs continued OT Services  OT Visit Diagnosis Unsteadiness on feet (R26.81)  OT Problem List Decreased strength;Decreased activity tolerance;Decreased knowledge of use of DME or AE;Cardiopulmonary status limiting activity;Obesity  Barriers to Discharge Comments Lives in a shed  OT Plan  OT Frequency (ACUTE ONLY) Min 3X/week  OT Treatment/Interventions (ACUTE ONLY) Self-care/ADL training;Therapeutic exercise;Neuromuscular education;DME and/or AE instruction;Energy conservation;Therapeutic activities;Patient/family education  AM-PAC OT "6 Clicks" Daily Activity Outcome Measure (Version 2)  Help from another person eating meals? 4  Help from another person  taking care of  personal grooming? 3  Help from another person toileting, which includes using toliet, bedpan, or urinal? 3  Help from another person bathing (including washing, rinsing, drying)? 3  Help from another person to put on and taking off regular upper body clothing? 3  Help from another person to put on and taking off regular lower body clothing? 3  6 Click Score 19  OT Recommendation  Recommendations for Other Services Other (comment) (SW consult)  Follow Up Recommendations No OT follow up;Supervision - Intermittent  OT Equipment 3 in 1 bedside commode (as a shower chair)  Individuals Consulted  Consulted and Agree with Results and Recommendations Patient  Acute Rehab OT Goals  Patient Stated Goal to get better  OT Goal Formulation With patient  Time For Goal Achievement 09/19/19  Potential to Achieve Goals Good  OT Time Calculation  OT Start Time (ACUTE ONLY) 1110  OT Stop Time (ACUTE ONLY) 1151  OT Time Calculation (min) 41 min  OT General Charges  $OT Visit 1 Visit  OT Evaluation  $OT Eval Moderate Complexity 1 Mod  OT Treatments  $Self Care/Home Management  23-37 mins  Written Expression  Dominant Hand Right  Luisa Dago, OT/L   Acute OT Clinical Specialist Acute Rehabilitation Services Pager [phone] Office [phone]

## 2019-09-05 NOTE — Progress Notes (Signed)
PROGRESS NOTE                                                                                                                                                                                                             Patient Demographics:    Roy Lee, is a 64 y.o. male, DOB - June 03, 1956, ZOX:[id number]  Outpatient Primary MD for the patient is Carol Ada, MD    LOS - 2  Admit date - 09/03/2019    Chief Complaint  Patient presents with  . COVID POSITIVE  . Shortness of Breath       Brief Narrative  Roy Lee is a 64 y.o. male with medical history significant of essential hypertension, chronic arthritis and obesity.  Patient presents to the emergency room 4 days after testing positive for Covid with worsening shortness of breath.   Subjective:   Patient in bed, appears comfortable, denies any headache, no fever, no chest pain or pressure, much improved shortness of breath , no abdominal pain. No focal weakness.    Assessment  & Plan :     1. Acute Hypoxic Resp. Failure due to Acute Covid 19 Viral Pneumonitis during the ongoing 2020 Covid 19 Pandemic - he has severe disease, has been started on IV steroids and remdesivir, Actemra after consent was given on 09/04/2019.  He has shown excellent improvement, continue to titrate down oxygen he is down from 15 L high flow to 8 L, continue to monitor closely.   Encouraged the patient to sit up in chair in the daytime use I-S and flutter valve for pulmonary toiletry and then prone in bed when at night.      SpO2: 90 % O2 Flow Rate (L/min): 8 L/min  Recent Labs  Lab 09/03/19 1848 09/04/19 0725 09/05/19 0350  CRP 19.6* 20.0* 14.8*  DDIMER 3.31* 6.78* 7.02*  FERRITIN 969*  --   --   BNP  --  60.7 48.0  PROCALCITON <0.10 <0.10 <0.10    Hepatic Function Latest Ref Rng & Units 09/05/2019 09/03/2019  Total Protein 6.5 - 8.1 g/dL 6.8 8.0  Albumin 3.5 - 5.0  g/dL 2.8(L) 3.5  AST 15 - 41 U/L 38 51(H)  ALT 0 - 44 U/L 67(H) 58(H)  Alk Phosphatase 38 - 126 U/L 35(L) 39  Total Bilirubin 0.3 - 1.2 mg/dL 0.4  0.8      2.  Bilateral PEs.  Seem to be hemodynamically not significant.  Full dose Lovenox.  3.  Essential hypertension.  Blood pressure slightly low on 09/05/2019, hold blood pressure medications gently hydrate and monitor.  4.  Obesity.  BMI of 38.  Follow with PCP for weight loss.    Condition -   Guarded  Family Communication  : Wife updated 09/05/2019  Code Status :  Full  Diet :   Diet Order            Diet Heart Room service appropriate? Yes; Fluid consistency: Thin  Diet effective now               Disposition Plan  : Stay inpatient, still severely hypoxic  Consults  : None  Procedures  :    CT angiogram. Small bilateral segmental and subsegmental pulmonary emboli in the lower lobes. No evidence of right heart strain. Extensive bilateral ground-glass airspace opacities compatible with COVID pneumonia. Small hiatal hernia.  PUD Prophylaxis : None  DVT Prophylaxis  :  Lovenox    Lab Results  Component Value Date   PLT 286 09/05/2019    Inpatient Medications  Scheduled Meds: . albuterol  2 puff Inhalation Q6H  . vitamin C  500 mg Oral Daily  . aspirin  81 mg Oral Daily  . enoxaparin (LOVENOX) injection  120 mg Subcutaneous Q12H  . latanoprost  1 drop Both Eyes Daily  . methylPREDNISolone (SOLU-MEDROL) injection  60 mg Intravenous Q12H  . omega-3 acid ethyl esters  1 g Oral Daily  . zinc sulfate  220 mg Oral Daily   Continuous Infusions: . remdesivir 100 mg in NS 100 mL 100 mg (09/05/19 0714)   PRN Meds:.acetaminophen, alum & mag hydroxide-simeth, chlorpheniramine-HYDROcodone, guaiFENesin-dextromethorphan, [DISCONTINUED] ondansetron **OR** ondansetron (ZOFRAN) IV, senna-docusate  Antibiotics  :    Anti-infectives (From admission, onward)   Start     Dose/Rate Route Frequency Ordered Stop   09/05/19  1000  remdesivir 100 mg in sodium chloride 0.9 % 100 mL IVPB  Status:  Discontinued     100 mg 200 mL/hr over 30 Minutes Intravenous Daily 09/04/19 0637 09/04/19 0646   09/04/19 1000  remdesivir 100 mg in sodium chloride 0.9 % 100 mL IVPB     100 mg 200 mL/hr over 30 Minutes Intravenous Daily 09/03/19 2115 09/08/19 0959   09/04/19 0645  remdesivir 200 mg in sodium chloride 0.9% 250 mL IVPB  Status:  Discontinued     200 mg 580 mL/hr over 30 Minutes Intravenous Once 09/04/19 6659 09/04/19 0646   09/03/19 2200  remdesivir 200 mg in sodium chloride 0.9% 250 mL IVPB     200 mg 580 mL/hr over 30 Minutes Intravenous Once 09/03/19 2115 09/03/19 2217       Time Spent in minutes  30   Roy Lee M.D on 09/05/2019 at 11:01 AM  To page go to [email or web address] - password [facility]  [facility] -  Office  [phone]    See all Orders from today for further details    Objective:   Vitals:   09/05/19 0503 09/05/19 0705 09/05/19 0805 09/05/19 0900  BP: 122/71 (!) 96/54    Pulse: 69 63  70  Resp: (!) '25 20  17  ' Temp: 98.6 F (37 C) 98.5 F (36.9 C)    TempSrc: Oral Oral    SpO2: (!) 88% 93% 93% 90%  Weight:      Height:  Wt Readings from Last 3 Encounters:  09/04/19 124.5 kg  12/18/15 129.7 kg     Intake/Output Summary (Last 24 hours) at 09/05/2019 1101 Last data filed at 09/05/2019 1000 Gross per 24 hour  Intake 643.19 ml  Output 700 ml  Net -56.81 ml     Physical Exam  Awake Alert,   No new F.N deficits, Normal affect [place].AT,PERRAL Supple Neck,No JVD, No cervical lymphadenopathy appriciated.  Symmetrical Chest wall movement, Good air movement bilaterally, CTAB RRR,No Gallops, Rubs or new Murmurs, No Parasternal Heave +ve B.Sounds, Abd Soft, No tenderness, No organomegaly appriciated, No rebound - guarding or rigidity. No Cyanosis, Clubbing or edema, No new Rash or bruise      Data Review:    CBC Recent Labs  Lab 09/03/19 1848 09/04/19 0725  09/05/19 0350  WBC 6.5 4.4 11.7*  HGB 15.2 13.5 13.4  HCT 44.2 39.9 39.7  PLT 263 238 286  MCV 86.3 86.2 85.9  MCH 29.7 29.2 29.0  MCHC 34.4 33.8 33.8  RDW 14.1 14.0 13.8  LYMPHSABS 0.9  --  1.1  MONOABS 0.6  --  0.4  EOSABS 0.0  --  0.0  BASOSABS 0.0  --  0.0    Chemistries  Recent Labs  Lab 09/03/19 1848 09/04/19 0725 09/05/19 0350  NA 137 138 135  K 4.1 4.3 4.5  CL 103 106 103  CO2 '22 23 22  ' GLUCOSE 122* 152* 214*  BUN 26* 26* 36*  CREATININE 0.82 0.71 0.84  CALCIUM 8.8* 8.5* 8.9  MG  --  2.4 2.2  AST 51*  --  38  ALT 58*  --  67*  ALKPHOS 39  --  35*  BILITOT 0.8  --  0.4   ------------------------------------------------------------------------------------------------------------------ Recent Labs    09/03/19 1848  TRIG 83    No results found for: HGBA1C ------------------------------------------------------------------------------------------------------------------ No results for input(s): TSH, T4TOTAL, T3FREE, THYROIDAB in the last 72 hours.  Invalid input(s): FREET3  Cardiac Enzymes No results for input(s): CKMB, TROPONINI, MYOGLOBIN in the last 168 hours.  Invalid input(s): CK ------------------------------------------------------------------------------------------------------------------    Component Value Date/Time   BNP 48.0 09/05/2019 0350    Micro Results Recent Results (from the past 240 hour(s))  Blood Culture (routine x 2)     Status: None (Preliminary result)   Collection Time: 09/03/19  6:48 PM   Specimen: BLOOD  Result Value Ref Range Status   Specimen Description BLOOD RIGHT ANTECUBITAL  Final   Special Requests   Final    BOTTLES DRAWN AEROBIC AND ANAEROBIC Blood Culture results may not be optimal due to an excessive volume of blood received in culture bottles Performed at [facility] [place] [address], [place], [place] [address]    Culture   Final    NO GROWTH 2 DAYS Performed at [facility] [address] [place], [place] [address]    Report Status PENDING  Incomplete  Blood Culture (routine x 2)     Status: None (Preliminary result)   Collection Time: 09/03/19  6:48 PM   Specimen: BLOOD RIGHT FOREARM  Result Value Ref Range Status   Specimen Description BLOOD RIGHT FOREARM  Final   Special Requests   Final    BOTTLES DRAWN AEROBIC AND ANAEROBIC Blood Culture adequate volume Performed at [facility] [address], [place], [place] [address]    Culture   Final    NO GROWTH 2 DAYS Performed at [facility]  [address], [place], [place] [address]    Report Status PENDING  Incomplete    Radiology Reports CT ANGIO CHEST PE W OR WO CONTRAST  Result Date: 09/04/2019 CLINICAL DATA:  COVID positive.  Shortness of breath EXAM: CT ANGIOGRAPHY CHEST WITH CONTRAST TECHNIQUE: Multidetector CT imaging of the chest was performed using the standard protocol during bolus administration of intravenous contrast. Multiplanar CT image reconstructions and MIPs were obtained to evaluate the vascular anatomy. CONTRAST:  49m OMNIPAQUE IOHEXOL 350 MG/ML SOLN COMPARISON:  09/03/2019 chest x-ray FINDINGS: Cardiovascular: Filling defects are seen in the lower lobe pulmonary arteries bilaterally compatible with segmental and subsegmental pulmonary emboli. No evidence of right heart strain. Heart is borderline in size. Aorta is normal caliber. Mediastinum/Nodes: Small hiatal hernia. Small scattered reactive mediastinal lymph nodes. No mediastinal, hilar, or axillary adenopathy. Thyroid unremarkable. Lungs/Pleura: Extensive bilateral ground-glass airspace opacities within the lungs. No effusions. Upper Abdomen: Imaging into the upper abdomen shows no acute findings. Musculoskeletal: Chest wall soft tissues are unremarkable. No acute bony abnormality. Review of the MIP images confirms the above findings. IMPRESSION: Small bilateral segmental and subsegmental pulmonary emboli in  the lower lobes. No evidence of right heart strain. Extensive bilateral ground-glass airspace opacities compatible with COVID pneumonia. Small hiatal hernia. Electronically Signed   By: KRolm BaptiseM.D.   On: 09/04/2019 00:53   DG Chest Port 1 View  Result Date: 09/03/2019 CLINICAL DATA:  COVID-19 pneumonia EXAM: PORTABLE CHEST 1 VIEW COMPARISON:  05/25/2011 FINDINGS: There are diffuse bilateral hazy airspace opacities consistent with the patient's history of COVID-19 pneumonia. The lung volumes are low. The heart size is normal. There may be trace to small bilateral pleural effusions. There is no acute osseous abnormality. IMPRESSION: Hazy bilateral airspace opacities consistent with the patient's history of viral pneumonia. Electronically Signed   By: CConstance HolsterM.D.   On: 09/03/2019 19:24

## 2019-09-05 NOTE — Progress Notes (Signed)
Wife called she is updated, all questions answered. No other questions at this time.

## 2019-09-06 DIAGNOSIS — I2609 Other pulmonary embolism with acute cor pulmonale: Secondary | ICD-10-CM

## 2019-09-06 LAB — CBC WITH DIFFERENTIAL/PLATELET
Abs Immature Granulocytes: 0.21 10*3/uL — ABNORMAL HIGH (ref 0.00–0.07)
Basophils Absolute: 0 10*3/uL (ref 0.0–0.1)
Basophils Relative: 0 %
Eosinophils Absolute: 0 10*3/uL (ref 0.0–0.5)
Eosinophils Relative: 0 %
HCT: 39.7 % (ref 39.0–52.0)
Hemoglobin: 13.3 g/dL (ref 13.0–17.0)
Immature Granulocytes: 1 %
Lymphocytes Relative: 6 %
Lymphs Abs: 0.9 10*3/uL (ref 0.7–4.0)
MCH: 29.2 pg (ref 26.0–34.0)
MCHC: 33.5 g/dL (ref 30.0–36.0)
MCV: 87.1 fL (ref 80.0–100.0)
Monocytes Absolute: 0.5 10*3/uL (ref 0.1–1.0)
Monocytes Relative: 4 %
Neutro Abs: 13.3 10*3/uL — ABNORMAL HIGH (ref 1.7–7.7)
Neutrophils Relative %: 89 %
Platelets: 347 10*3/uL (ref 150–400)
RBC: 4.56 MIL/uL (ref 4.22–5.81)
RDW: 13.8 % (ref 11.5–15.5)
WBC: 14.9 10*3/uL — ABNORMAL HIGH (ref 4.0–10.5)
nRBC: 0 % (ref 0.0–0.2)

## 2019-09-06 LAB — COMPREHENSIVE METABOLIC PANEL
ALT: 53 U/L — ABNORMAL HIGH (ref 0–44)
AST: 23 U/L (ref 15–41)
Albumin: 2.8 g/dL — ABNORMAL LOW (ref 3.5–5.0)
Alkaline Phosphatase: 41 U/L (ref 38–126)
Anion gap: 11 (ref 5–15)
BUN: 36 mg/dL — ABNORMAL HIGH (ref 8–23)
CO2: 23 mmol/L (ref 22–32)
Calcium: 8.9 mg/dL (ref 8.9–10.3)
Chloride: 103 mmol/L (ref 98–111)
Creatinine, Ser: 0.8 mg/dL (ref 0.61–1.24)
GFR calc Af Amer: >60 mL/min (ref >60)
GFR calc non Af Amer: >60 mL/min (ref >60)
Glucose, Bld: 252 mg/dL — ABNORMAL HIGH (ref 70–99)
Potassium: 4.9 mmol/L (ref 3.5–5.1)
Sodium: 137 mmol/L (ref 135–145)
Total Bilirubin: 0.7 mg/dL (ref 0.3–1.2)
Total Protein: 6.6 g/dL (ref 6.5–8.1)

## 2019-09-06 LAB — HEMOGLOBIN A1C
Hgb A1c MFr Bld: 6.3 % — ABNORMAL HIGH (ref 4.8–5.6)
Mean Plasma Glucose: 134.11 mg/dL

## 2019-09-06 LAB — GLUCOSE, CAPILLARY
Glucose-Capillary: 296 mg/dL — ABNORMAL HIGH (ref 70–99)
Glucose-Capillary: 230 mg/dL — ABNORMAL HIGH (ref 70–99)
Glucose-Capillary: 215 mg/dL — ABNORMAL HIGH (ref 70–99)

## 2019-09-06 LAB — D-DIMER, QUANTITATIVE: D-Dimer, Quant: 3.84 ug/mL-FEU — ABNORMAL HIGH (ref 0.00–0.50)

## 2019-09-06 LAB — C-REACTIVE PROTEIN: CRP: 7.6 mg/dL — ABNORMAL HIGH (ref <1.0)

## 2019-09-06 LAB — PROCALCITONIN: Procalcitonin: 0.19 ng/mL

## 2019-09-06 LAB — MAGNESIUM: Magnesium: 2.3 mg/dL (ref 1.7–2.4)

## 2019-09-06 LAB — BRAIN NATRIURETIC PEPTIDE: B Natriuretic Peptide: 70.9 pg/mL (ref 0.0–100.0)

## 2019-09-06 MED ORDER — INSULIN ASPART 100 UNIT/ML ~~LOC~~ SOLN
0.0000 [IU] | Freq: Three times a day (TID) | SUBCUTANEOUS | Status: DC
  Administered 2019-09-06: 2 [IU] via SUBCUTANEOUS
  Administered 2019-09-06: 8 [IU] via SUBCUTANEOUS
  Administered 2019-09-07 (×2): 3 [IU] via SUBCUTANEOUS
  Administered 2019-09-07: 5 [IU] via SUBCUTANEOUS
  Administered 2019-09-08: 2 [IU] via SUBCUTANEOUS
  Administered 2019-09-08 – 2019-09-09 (×2): 5 [IU] via SUBCUTANEOUS
  Administered 2019-09-09: 3 [IU] via SUBCUTANEOUS

## 2019-09-06 MED ORDER — INSULIN ASPART 100 UNIT/ML ~~LOC~~ SOLN
0.0000 [IU] | Freq: Every day | SUBCUTANEOUS | Status: DC
  Administered 2019-09-06: 2 [IU] via SUBCUTANEOUS
  Administered 2019-09-07 – 2019-09-08 (×2): 3 [IU] via SUBCUTANEOUS

## 2019-09-06 NOTE — Progress Notes (Signed)
PROGRESS NOTE                                                                                                                                                                                                             Patient Demographics:    Roy Lee, is a 64 y.o. male, DOB - 11-28-1955, NGB:[id number]  Outpatient Primary MD for the patient is Carol Ada, MD    LOS - 3  Admit date - 09/03/2019    Chief Complaint  Patient presents with  . COVID POSITIVE  . Shortness of Breath       Brief Narrative  Roy Lee is a 64 y.o. male with medical history significant of essential hypertension, chronic arthritis and obesity.  Patient presents to the emergency room 4 days after testing positive for Covid with worsening shortness of breath.   Subjective:   Patient in bed, appears comfortable, denies any headache, no fever, no chest pain or pressure, much improved shortness of breath , no abdominal pain. No focal weakness.    Assessment  & Plan :     1. Acute Hypoxic Resp. Failure due to Acute Covid 19 Viral Pneumonitis during the ongoing 2020 Covid 19 Pandemic - he has severe disease, has been started on IV steroids and remdesivir, Actemra after consent was given on 09/04/2019.  He has shown excellent improvement, continue to titrate down oxygen he is down from 15 L high flow to 2 L, continue to monitor closely.   Encouraged the patient to sit up in chair in the daytime use I-S and flutter valve for pulmonary toiletry and then prone in bed when at night.      SpO2: 96 % O2 Flow Rate (L/min): 2 L/min  Recent Labs  Lab 09/03/19 1848 09/04/19 0725 09/05/19 0350 09/06/19 0420  CRP 19.6* 20.0* 14.8* 7.6*  DDIMER 3.31* 6.78* 7.02* 3.84*  FERRITIN 969*  --   --   --   BNP  --  60.7 48.0 70.9  PROCALCITON <0.10 <0.10 <0.10 0.19    Hepatic Function Latest Ref Rng & Units 09/06/2019 09/05/2019 09/03/2019  Total  Protein 6.5 - 8.1 g/dL 6.6 6.8 8.0  Albumin 3.5 - 5.0 g/dL 2.8(L) 2.8(L) 3.5  AST 15 - 41 U/L 23 38 51(H)  ALT 0 - 44 U/L 53(H) 67(H) 58(H)  Alk Phosphatase  38 - 126 U/L 41 35(L) 39  Total Bilirubin 0.3 - 1.2 mg/dL 0.7 0.4 0.8      2.  Bilateral PEs.  Seem to be hemodynamically not significant.  Full dose Lovenox.  Transition to Eliquis tomorrow  3.  Essential hypertension.  Blood pressure slightly low on 09/05/2019, hold blood pressure medications gently hydrate and monitor.  4.  Obesity.  BMI of 38.  Follow with PCP for weight loss.    Condition -   Guarded  Family Communication  : Discussed with the patient  Code Status :  Full  Diet :   Diet Order            Diet Heart Room service appropriate? Yes; Fluid consistency: Thin  Diet effective now               Disposition Plan  : Stay inpatient, still severely hypoxic  Consults  : None  Procedures  :    CT angiogram. Small bilateral segmental and subsegmental pulmonary emboli in the lower lobes. No evidence of right heart strain. Extensive bilateral ground-glass airspace opacities compatible with COVID pneumonia. Small hiatal hernia.  PUD Prophylaxis : None  DVT Prophylaxis  :  Lovenox    Lab Results  Component Value Date   PLT 347 09/06/2019    Inpatient Medications  Scheduled Meds: . albuterol  2 puff Inhalation Q6H  . vitamin C  500 mg Oral Daily  . aspirin  81 mg Oral Daily  . enoxaparin (LOVENOX) injection  120 mg Subcutaneous Q12H  . insulin aspart  0-15 Units Subcutaneous TID WC  . insulin aspart  0-5 Units Subcutaneous QHS  . latanoprost  1 drop Both Eyes Daily  . methylPREDNISolone (SOLU-MEDROL) injection  60 mg Intravenous Q12H  . omega-3 acid ethyl esters  1 g Oral Daily  . zinc sulfate  220 mg Oral Daily   Continuous Infusions: . remdesivir 100 mg in NS 100 mL 100 mg (09/06/19 0728)   PRN Meds:.acetaminophen, alum & mag hydroxide-simeth, chlorpheniramine-HYDROcodone,  guaiFENesin-dextromethorphan, [DISCONTINUED] ondansetron **OR** ondansetron (ZOFRAN) IV, senna-docusate  Antibiotics  :    Anti-infectives (From admission, onward)   Start     Dose/Rate Route Frequency Ordered Stop   09/05/19 1000  remdesivir 100 mg in sodium chloride 0.9 % 100 mL IVPB  Status:  Discontinued     100 mg 200 mL/hr over 30 Minutes Intravenous Daily 09/04/19 0637 09/04/19 0646   09/04/19 1000  remdesivir 100 mg in sodium chloride 0.9 % 100 mL IVPB     100 mg 200 mL/hr over 30 Minutes Intravenous Daily 09/03/19 2115 09/08/19 0959   09/04/19 0645  remdesivir 200 mg in sodium chloride 0.9% 250 mL IVPB  Status:  Discontinued     200 mg 580 mL/hr over 30 Minutes Intravenous Once 09/04/19 2761 09/04/19 0646   09/03/19 2200  remdesivir 200 mg in sodium chloride 0.9% 250 mL IVPB     200 mg 580 mL/hr over 30 Minutes Intravenous Once 09/03/19 2115 09/03/19 2217         Emeline Gins Canuto Kingston M.D on 09/06/2019 at 2:42 PM  To page go to [email or web address] - password [facility]  [facility] -  Office  [phone]    See all Orders from today for further details    Objective:   Vitals:   09/06/19 0103 09/06/19 0400 09/06/19 0715 09/06/19 1120  BP: 129/89 124/78 119/78 127/70  Pulse: 64 73 67 69  Resp: 20 20 (!) 22 (!) 22  Temp: 98.9 F (37.2 C) 98.4 F (36.9 C) 98.5 F (36.9 C) (!) 97.3 F (36.3 C)  TempSrc: Oral Oral Oral Oral  SpO2: 94% 94% 98% 96%  Weight:      Height:        Wt Readings from Last 3 Encounters:  09/04/19 124.5 kg  12/18/15 129.7 kg     Intake/Output Summary (Last 24 hours) at 09/06/2019 1442 Last data filed at 09/06/2019 0800 Gross per 24 hour  Intake 1160 ml  Output --  Net 1160 ml     Physical Exam  Awake Alert, Oriented X 3, No new F.N deficits, Normal affect Symmetrical Chest wall movement, Good air movement bilaterally, CTAB RRR,No Gallops,Rubs or new Murmurs, No Parasternal Heave +ve B.Sounds, Abd Soft, No tenderness, No rebound -  guarding or rigidity. No Cyanosis, Clubbing or edema, No new Rash or bruise       Data Review:    CBC Recent Labs  Lab 09/03/19 1848 09/04/19 0725 09/05/19 0350 09/06/19 0420  WBC 6.5 4.4 11.7* 14.9*  HGB 15.2 13.5 13.4 13.3  HCT 44.2 39.9 39.7 39.7  PLT 263 238 286 347  MCV 86.3 86.2 85.9 87.1  MCH 29.7 29.2 29.0 29.2  MCHC 34.4 33.8 33.8 33.5  RDW 14.1 14.0 13.8 13.8  LYMPHSABS 0.9  --  1.1 0.9  MONOABS 0.6  --  0.4 0.5  EOSABS 0.0  --  0.0 0.0  BASOSABS 0.0  --  0.0 0.0    Chemistries  Recent Labs  Lab 09/03/19 1848 09/04/19 0725 09/05/19 0350 09/06/19 0420  NA 137 138 135 137  K 4.1 4.3 4.5 4.9  CL 103 106 103 103  CO2 '22 23 22 23  ' GLUCOSE 122* 152* 214* 252*  BUN 26* 26* 36* 36*  CREATININE 0.82 0.71 0.84 0.80  CALCIUM 8.8* 8.5* 8.9 8.9  MG  --  2.4 2.2 2.3  AST 51*  --  38 23  ALT 58*  --  67* 53*  ALKPHOS 39  --  35* 41  BILITOT 0.8  --  0.4 0.7   ------------------------------------------------------------------------------------------------------------------ Recent Labs    09/03/19 1848  TRIG 83    Lab Results  Component Value Date   HGBA1C 6.3 (H) 09/06/2019   ------------------------------------------------------------------------------------------------------------------ No results for input(s): TSH, T4TOTAL, T3FREE, THYROIDAB in the last 72 hours.  Invalid input(s): FREET3  Cardiac Enzymes No results for input(s): CKMB, TROPONINI, MYOGLOBIN in the last 168 hours.  Invalid input(s): CK ------------------------------------------------------------------------------------------------------------------    Component Value Date/Time   BNP 70.9 09/06/2019 0420    Micro Results Recent Results (from the past 240 hour(s))  Blood Culture (routine x 2)     Status: None (Preliminary result)   Collection Time: 09/03/19  6:48 PM   Specimen: BLOOD  Result Value Ref Range Status   Specimen Description BLOOD RIGHT ANTECUBITAL  Final    Special Requests   Final    BOTTLES DRAWN AEROBIC AND ANAEROBIC Blood Culture results may not be optimal due to an excessive volume of blood received in culture bottles Performed at [facility] [place] [address], [place], [place] [address]    Culture   Final    NO GROWTH 3 DAYS Performed at [facility], [place] [address], [place], [place] [address]    Report Status PENDING  Incomplete  Blood Culture (routine x 2)     Status: None (Preliminary result)   Collection Time: 09/03/19  6:48 PM   Specimen: BLOOD  RIGHT FOREARM  Result Value Ref Range Status   Specimen Description BLOOD RIGHT FOREARM  Final   Special Requests   Final    BOTTLES DRAWN AEROBIC AND ANAEROBIC Blood Culture adequate volume Performed at [place] [address], [place], [place] [address]    Culture   Final    NO GROWTH 3 DAYS Performed at [facility], [place] [address], [place], [place] [address]    Report Status PENDING  Incomplete    Radiology Reports CT ANGIO CHEST PE W OR WO CONTRAST  Result Date: 09/04/2019 CLINICAL DATA:  COVID positive.  Shortness of breath EXAM: CT ANGIOGRAPHY CHEST WITH CONTRAST TECHNIQUE: Multidetector CT imaging of the chest was performed using the standard protocol during bolus administration of intravenous contrast. Multiplanar CT image reconstructions and MIPs were obtained to evaluate the vascular anatomy. CONTRAST:  73m OMNIPAQUE IOHEXOL 350 MG/ML SOLN COMPARISON:  09/03/2019 chest x-ray FINDINGS: Cardiovascular: Filling defects are seen in the lower lobe pulmonary arteries bilaterally compatible with segmental and subsegmental pulmonary emboli. No evidence of right heart strain. Heart is borderline in size. Aorta is normal caliber. Mediastinum/Nodes: Small hiatal hernia. Small scattered reactive mediastinal lymph nodes. No mediastinal, hilar, or axillary adenopathy. Thyroid unremarkable. Lungs/Pleura: Extensive bilateral ground-glass  airspace opacities within the lungs. No effusions. Upper Abdomen: Imaging into the upper abdomen shows no acute findings. Musculoskeletal: Chest wall soft tissues are unremarkable. No acute bony abnormality. Review of the MIP images confirms the above findings. IMPRESSION: Small bilateral segmental and subsegmental pulmonary emboli in the lower lobes. No evidence of right heart strain. Extensive bilateral ground-glass airspace opacities compatible with COVID pneumonia. Small hiatal hernia. Electronically Signed   By: KRolm BaptiseM.D.   On: 09/04/2019 00:53   DG Chest Port 1 View  Result Date: 09/03/2019 CLINICAL DATA:  COVID-19 pneumonia EXAM: PORTABLE CHEST 1 VIEW COMPARISON:  05/25/2011 FINDINGS: There are diffuse bilateral hazy airspace opacities consistent with the patient's history of COVID-19 pneumonia. The lung volumes are low. The heart size is normal. There may be trace to small bilateral pleural effusions. There is no acute osseous abnormality. IMPRESSION: Hazy bilateral airspace opacities consistent with the patient's history of viral pneumonia. Electronically Signed   By: CConstance HolsterM.D.   On: 09/03/2019 19:24

## 2019-09-06 NOTE — Plan of Care (Signed)

## 2019-09-07 LAB — GLUCOSE, CAPILLARY
Glucose-Capillary: 195 mg/dL — ABNORMAL HIGH (ref 70–99)
Glucose-Capillary: 174 mg/dL — ABNORMAL HIGH (ref 70–99)
Glucose-Capillary: 228 mg/dL — ABNORMAL HIGH (ref 70–99)
Glucose-Capillary: 260 mg/dL — ABNORMAL HIGH (ref 70–99)

## 2019-09-07 LAB — PROCALCITONIN: Procalcitonin: 0.11 ng/mL

## 2019-09-07 LAB — CBC WITH DIFFERENTIAL/PLATELET
Abs Immature Granulocytes: 0.35 10*3/uL — ABNORMAL HIGH (ref 0.00–0.07)
Basophils Absolute: 0 10*3/uL (ref 0.0–0.1)
Basophils Relative: 0 %
Eosinophils Absolute: 0 10*3/uL (ref 0.0–0.5)
Eosinophils Relative: 0 %
HCT: 44.7 % (ref 39.0–52.0)
Hemoglobin: 15 g/dL (ref 13.0–17.0)
Immature Granulocytes: 2 %
Lymphocytes Relative: 6 %
Lymphs Abs: 0.9 10*3/uL (ref 0.7–4.0)
MCH: 29.2 pg (ref 26.0–34.0)
MCHC: 33.6 g/dL (ref 30.0–36.0)
MCV: 87 fL (ref 80.0–100.0)
Monocytes Absolute: 0.4 10*3/uL (ref 0.1–1.0)
Monocytes Relative: 3 %
Neutro Abs: 14.7 10*3/uL — ABNORMAL HIGH (ref 1.7–7.7)
Neutrophils Relative %: 89 %
Platelets: 370 10*3/uL (ref 150–400)
RBC: 5.14 MIL/uL (ref 4.22–5.81)
RDW: 13.5 % (ref 11.5–15.5)
WBC: 16.4 10*3/uL — ABNORMAL HIGH (ref 4.0–10.5)
nRBC: 0 % (ref 0.0–0.2)

## 2019-09-07 LAB — COMPREHENSIVE METABOLIC PANEL
ALT: 50 U/L — ABNORMAL HIGH (ref 0–44)
AST: 25 U/L (ref 15–41)
Albumin: 3.1 g/dL — ABNORMAL LOW (ref 3.5–5.0)
Alkaline Phosphatase: 54 U/L (ref 38–126)
Anion gap: 13 (ref 5–15)
BUN: 32 mg/dL — ABNORMAL HIGH (ref 8–23)
CO2: 21 mmol/L — ABNORMAL LOW (ref 22–32)
Calcium: 8.9 mg/dL (ref 8.9–10.3)
Chloride: 101 mmol/L (ref 98–111)
Creatinine, Ser: 0.87 mg/dL (ref 0.61–1.24)
GFR calc Af Amer: >60 mL/min (ref >60)
GFR calc non Af Amer: >60 mL/min (ref >60)
Glucose, Bld: 193 mg/dL — ABNORMAL HIGH (ref 70–99)
Potassium: 4.9 mmol/L (ref 3.5–5.1)
Sodium: 135 mmol/L (ref 135–145)
Total Bilirubin: 0.8 mg/dL (ref 0.3–1.2)
Total Protein: 7.1 g/dL (ref 6.5–8.1)

## 2019-09-07 LAB — C-REACTIVE PROTEIN: CRP: 4.8 mg/dL — ABNORMAL HIGH (ref <1.0)

## 2019-09-07 LAB — D-DIMER, QUANTITATIVE: D-Dimer, Quant: 3.07 ug/mL-FEU — ABNORMAL HIGH (ref 0.00–0.50)

## 2019-09-07 LAB — MAGNESIUM: Magnesium: 2.2 mg/dL (ref 1.7–2.4)

## 2019-09-07 LAB — BRAIN NATRIURETIC PEPTIDE: B Natriuretic Peptide: 84.5 pg/mL (ref 0.0–100.0)

## 2019-09-07 MED ORDER — APIXABAN 5 MG PO TABS
10.0000 mg | ORAL_TABLET | Freq: Two times a day (BID) | ORAL | Status: DC
  Administered 2019-09-07 – 2019-09-09 (×5): 10 mg via ORAL
  Filled 2019-09-07 (×5): qty 2

## 2019-09-07 MED ORDER — METHYLPREDNISOLONE SODIUM SUCC 125 MG IJ SOLR
60.0000 mg | Freq: Every day | INTRAMUSCULAR | Status: DC
  Administered 2019-09-07 – 2019-09-09 (×3): 60 mg via INTRAVENOUS
  Filled 2019-09-07 (×3): qty 2

## 2019-09-07 MED ORDER — APIXABAN 5 MG PO TABS: 5.0000 mg | ORAL_TABLET | Freq: Two times a day (BID) | ORAL | Status: DC

## 2019-09-07 NOTE — Progress Notes (Signed)
Physical Therapy Treatment Patient Details Name: Roy Lee MRN: [id number] DOB: 12/11/1955 Today's Date: 09/07/2019    History of Present Illness Pt is a 64 y.o. male admitted 09/03/19 with worsening SOB, had tested (+) for COVID-19 four days prior. Worked up for acute hypoxic respiratory failure secondary to COVID-19 viral pneumonitis requiring HFNC. CT angiogram with small bilateral PEs, no evidence of heart strain. PMH includes HTN, arthritis, obesity.    PT Comments    Pt making progress with mobility and also tx. Nurse reports pt had ambulated earlier in day on 6l/min, upon therapist arrival pt was on 2L/min via [place] and sats in 90s, with ambulation increased to 4L/min via [place] and pt did well till end, throughout ambulation was in high 80s and 90s but once completed desat to 78% with cues for pursed lip breathing pt was able to recover very quickly, within 21mins. Pt was able to complete toiletting again on 4l/min via [place], min desat with this was 85% and pt again was able to recover very quickly back to low 90s. Overall pt is making great progress with mobility, independence and also activity tolerance.    Follow Up Recommendations  No PT follow up     Equipment Recommendations  None recommended by PT    Recommendations for Other Services       Precautions / Restrictions Precautions Precautions: Other (comment) Precaution Comments: Watch SpO2 Restrictions Weight Bearing Restrictions: No    Mobility  Bed Mobility               General bed mobility comments: pt received sitting in recliner   Transfers Overall transfer level: Modified independent Equipment used: None Transfers: Sit to/from [facility] Sit to Stand: Modified independent (Device/Increase time);Supervision Stand pivot transfers: Supervision       General transfer comment: able to transfer from recliner and also low commode  Ambulation/Gait Ambulation/Gait assistance:  Supervision Gait Distance (Feet): 150 Feet Assistive device: 1 person hand held assist;None Gait Pattern/deviations: Step-through pattern;Decreased stride length     General Gait Details: ambulated approx 173ft with SBA initially but became short winded and needed hand held assist to complete distance, just for safety, while ambulating on 4L/min via [place] desat to min of 78% at end   [employer] Mobility    Modified Rankin (Stroke Patients Only)       Balance Overall balance assessment: Mild deficits observed, not formally tested                                          Cognition Arousal/Alertness: Awake/alert Behavior During Therapy: WFL for tasks assessed/performed Overall Cognitive Status: Within Functional Limits for tasks assessed                                        Exercises      General Comments        Pertinent Vitals/Pain Pain Assessment: No/denies pain    Home Living                      Prior Function            PT Goals (current goals can now be found in the care plan  section) Acute Rehab PT Goals Patient Stated Goal: to get better PT Goal Formulation: With patient Time For Goal Achievement: 09/19/19 Potential to Achieve Goals: Good Progress towards PT goals: Progressing toward goals    Frequency    Min 3X/week      PT Plan Current plan remains appropriate    Co-evaluation              AM-PAC PT "6 Clicks" Mobility   Outcome Measure  Help needed turning from your back to your side while in a flat bed without using bedrails?: None Help needed moving from lying on your back to sitting on the side of a flat bed without using bedrails?: None Help needed moving to and from a bed to a chair (including a wheelchair)?: None Help needed standing up from a chair using your arms (e.g., wheelchair or bedside chair)?: None Help needed to walk in hospital room?: A  Little Help needed climbing 3-5 steps with a railing? : A Little 6 Click Score: 22    End of Session Equipment Utilized During Treatment: Oxygen Activity Tolerance: Patient limited by fatigue;Patient limited by lethargy;Treatment limited secondary to medical complications (Comment) Patient left: in chair;with call bell/phone within reach Nurse Communication: Mobility status PT Visit Diagnosis: Other abnormalities of gait and mobility (R26.89)     Time: 5400-8676 PT Time Calculation (min) (ACUTE ONLY): 29 min  Charges:  $Gait Training: 8-22 mins $Therapeutic Activity: 8-22 mins                     Drema Pry, PT    [name] 09/07/2019, 3:43 PM

## 2019-09-07 NOTE — Progress Notes (Addendum)
PROGRESS NOTE                                                                                                                                                                                                             Patient Demographics:    Roy Lee, is a 64 y.o. male, DOB - 05-16-1956, IDP:[id number]  Outpatient Primary MD for the patient is Carol Ada, MD    LOS - 4  Admit date - 09/03/2019    Chief Complaint  Patient presents with  . COVID POSITIVE  . Shortness of Breath       Brief Narrative  Roy Lee is a 64 y.o. male with medical history significant of essential hypertension, chronic arthritis and obesity.  Patient presents to the emergency room 4 days after testing positive for Covid with worsening shortness of breath.   Subjective:   Patient in bed, appears comfortable, denies any headache, no fever, no chest pain or pressure, he does report dyspnea with exertion.    Assessment  & Plan :    Acute Hypoxic Resp. Failure due to Acute Covid 19 Viral Pneumonitis during the ongoing 2020 Covid 19 Pandemic  - he has severe disease on presentation, requiring 15 L high flow, he is currently on 2 L at rest. -Treated with IV remdesivir. -Tinea with IV steroids, will start tapering. -Received Actemra. -Was encouraged use incentive spirometry, flutter valve, and to prone, patient is very anxious about ambulation, discussed at length with him, encouraged to take small steps initially, and to continue to progress as he feels more comfortable, have explained for him likely need oxygen on discharge, likely will need increased oxygen supplement with activity. -CRP trending down which is reassuring.     SpO2: 98 % O2 Flow Rate (L/min): 3 L/min  Recent Labs  Lab 09/03/19 1848 09/04/19 0725 09/05/19 0350 09/06/19 0420 09/07/19 0222  CRP 19.6* 20.0* 14.8* 7.6* 4.8*  DDIMER 3.31* 6.78* 7.02* 3.84* 3.07*    FERRITIN 969*  --   --   --   --   BNP  --  60.7 48.0 70.9 84.5  PROCALCITON <0.10 <0.10 <0.10 0.19 0.11    Hepatic Function Latest Ref Rng & Units 09/07/2019 09/06/2019 09/05/2019  Total Protein 6.5 - 8.1 g/dL 7.1 6.6 6.8  Albumin 3.5 - 5.0 g/dL 3.1(L) 2.8(L) 2.8(L)  AST 15 - 41 U/L 25 23 38  ALT 0 - 44 U/L 50(H) 53(H) 67(H)  Alk Phosphatase 38 - 126 U/L 54 41 35(L)  Total Bilirubin 0.3 - 1.2 mg/dL 0.8 0.7 0.4     Bilateral PEs. -CTA chest is significant for bilateral PE, with no right heart strain, he is on full dose Lovenox, will transition to Eliquis today.  Essential hypertension.   - Blood pressure slightly low on 09/05/2019, hold blood pressure medications gently hydrate and monitor.  Obesity.  BMI of 38.  Follow with PCP for weight loss.   Hyperglycemia -  secondary to COVID-19 and steroids -, A1c is 6.3, diagnostic of prediabetes, will change to carb modified diet, continue with sliding scale  Condition -   Guarded  Family Communication  : Discussed with daughter via phone  Code Status :  Full  Diet :   Diet Order            Diet Heart Room service appropriate? Yes; Fluid consistency: Thin  Diet effective now               Disposition Plan  : He will need home with home health, will need oxygen supply on discharge, will need to evaluate oxygen requirement on activity, so hopefully 24 to 48 hours as he continues to improve  Consults  : None  Procedures  :    CT angiogram. Small bilateral segmental and subsegmental pulmonary emboli in the lower lobes. No evidence of right heart strain. Extensive bilateral ground-glass airspace opacities compatible with COVID pneumonia. Small hiatal hernia.  PUD Prophylaxis : None  DVT Prophylaxis  :  Lovenox    Lab Results  Component Value Date   PLT 370 09/07/2019    Inpatient Medications  Scheduled Meds: . albuterol  2 puff Inhalation Q6H  . apixaban  10 mg Oral BID   Followed by  . [START ON 09/14/2019] apixaban   5 mg Oral BID  . vitamin C  500 mg Oral Daily  . aspirin  81 mg Oral Daily  . insulin aspart  0-15 Units Subcutaneous TID WC  . insulin aspart  0-5 Units Subcutaneous QHS  . latanoprost  1 drop Both Eyes Daily  . methylPREDNISolone (SOLU-MEDROL) injection  60 mg Intravenous Daily  . omega-3 acid ethyl esters  1 g Oral Daily  . zinc sulfate  220 mg Oral Daily   Continuous Infusions:  PRN Meds:.acetaminophen, alum & mag hydroxide-simeth, chlorpheniramine-HYDROcodone, guaiFENesin-dextromethorphan, [DISCONTINUED] ondansetron **OR** ondansetron (ZOFRAN) IV, senna-docusate  Antibiotics  :    Anti-infectives (From admission, onward)   Start     Dose/Rate Route Frequency Ordered Stop   09/05/19 1000  remdesivir 100 mg in sodium chloride 0.9 % 100 mL IVPB  Status:  Discontinued     100 mg 200 mL/hr over 30 Minutes Intravenous Daily 09/04/19 0637 09/04/19 0646   09/04/19 1000  remdesivir 100 mg in sodium chloride 0.9 % 100 mL IVPB     100 mg 200 mL/hr over 30 Minutes Intravenous Daily 09/03/19 2115 09/07/19 0947   09/04/19 0645  remdesivir 200 mg in sodium chloride 0.9% 250 mL IVPB  Status:  Discontinued     200 mg 580 mL/hr over 30 Minutes Intravenous Once 09/04/19 0637 09/04/19 0646   09/03/19 2200  remdesivir 200 mg in sodium chloride 0.9% 250 mL IVPB     200 mg 580 mL/hr over 30 Minutes Intravenous Once 09/03/19 2115 09/03/19 2217  Phillips Climes M.D on 09/07/2019 at 11:57 AM  To page go to [email or web address] - password [facility]  [facility] -  Office  [phone]    See all Orders from today for further details    Objective:   Vitals:   09/06/19 2331 09/07/19 0341 09/07/19 0719 09/07/19 1117  BP: 119/64 128/88 119/85 136/90  Pulse: 67 65 68 69  Resp: (!) 22 (!) 21 19 (!) 22  Temp: 98.4 F (36.9 C) 98.5 F (36.9 C) 98.2 F (36.8 C) 97.8 F (36.6 C)  TempSrc: Oral Oral Oral Oral  SpO2: 97% 98% 92% 98%  Weight:      Height:        Wt Readings from Last  3 Encounters:  09/04/19 124.5 kg  12/18/15 129.7 kg     Intake/Output Summary (Last 24 hours) at 09/07/2019 1157 Last data filed at 09/07/2019 0917 Gross per 24 hour  Intake 480 ml  Output 0 ml  Net 480 ml     Physical Exam  Awake Alert, Oriented X 3, No new F.N deficits, Normal affect Symmetrical Chest wall movement, Good air movement bilaterally, CTAB RRR,No Gallops,Rubs or new Murmurs, No Parasternal Heave +ve B.Sounds, Abd Soft, No tenderness, No rebound - guarding or rigidity. No Cyanosis, Clubbing or edema, No new Rash or bruise         Data Review:    CBC Recent Labs  Lab 09/03/19 1848 09/04/19 0725 09/05/19 0350 09/06/19 0420 09/07/19 0222  WBC 6.5 4.4 11.7* 14.9* 16.4*  HGB 15.2 13.5 13.4 13.3 15.0  HCT 44.2 39.9 39.7 39.7 44.7  PLT 263 238 286 347 370  MCV 86.3 86.2 85.9 87.1 87.0  MCH 29.7 29.2 29.0 29.2 29.2  MCHC 34.4 33.8 33.8 33.5 33.6  RDW 14.1 14.0 13.8 13.8 13.5  LYMPHSABS 0.9  --  1.1 0.9 0.9  MONOABS 0.6  --  0.4 0.5 0.4  EOSABS 0.0  --  0.0 0.0 0.0  BASOSABS 0.0  --  0.0 0.0 0.0    Chemistries  Recent Labs  Lab 09/03/19 1848 09/04/19 0725 09/05/19 0350 09/06/19 0420 09/07/19 0222  NA 137 138 135 137 135  K 4.1 4.3 4.5 4.9 4.9  CL 103 106 103 103 101  CO2 '22 23 22 23 ' 21*  GLUCOSE 122* 152* 214* 252* 193*  BUN 26* 26* 36* 36* 32*  CREATININE 0.82 0.71 0.84 0.80 0.87  CALCIUM 8.8* 8.5* 8.9 8.9 8.9  MG  --  2.4 2.2 2.3 2.2  AST 51*  --  38 23 25  ALT 58*  --  67* 53* 50*  ALKPHOS 39  --  35* 41 54  BILITOT 0.8  --  0.4 0.7 0.8   ------------------------------------------------------------------------------------------------------------------ No results for input(s): CHOL, HDL, LDLCALC, TRIG, CHOLHDL, LDLDIRECT in the last 72 hours.  Lab Results  Component Value Date   HGBA1C 6.3 (H) 09/06/2019   ------------------------------------------------------------------------------------------------------------------ No results  for input(s): TSH, T4TOTAL, T3FREE, THYROIDAB in the last 72 hours.  Invalid input(s): FREET3  Cardiac Enzymes No results for input(s): CKMB, TROPONINI, MYOGLOBIN in the last 168 hours.  Invalid input(s): CK ------------------------------------------------------------------------------------------------------------------    Component Value Date/Time   BNP 84.5 09/07/2019 0222    Micro Results Recent Results (from the past 240 hour(s))  Blood Culture (routine x 2)     Status: None (Preliminary result)   Collection Time: 09/03/19  6:48 PM   Specimen: BLOOD  Result Value Ref Range Status   Specimen Description BLOOD RIGHT ANTECUBITAL  Final   Special Requests   Final    BOTTLES DRAWN AEROBIC AND ANAEROBIC Blood Culture results may not be optimal due to an excessive volume of blood received in culture bottles Performed at [facility] [address] [place], [place] [address]    Culture   Final    NO GROWTH 4 DAYS Performed at [facility], [place] [address], [place], [place] [address]    Report Status PENDING  Incomplete  Blood Culture (routine x 2)     Status: None (Preliminary result)   Collection Time: 09/03/19  6:48 PM   Specimen: BLOOD RIGHT FOREARM  Result Value Ref Range Status   Specimen Description BLOOD RIGHT FOREARM  Final   Special Requests   Final    BOTTLES DRAWN AEROBIC AND ANAEROBIC Blood Culture adequate volume Performed at [facility] [address] [place], [place] [address]    Culture   Final    NO GROWTH 4 DAYS Performed at [facility], [place] [address] [place], [place] [address]    Report Status PENDING  Incomplete    Radiology Reports CT ANGIO CHEST PE W OR WO CONTRAST  Result Date: 09/04/2019 CLINICAL DATA:  COVID positive.  Shortness of breath EXAM: CT ANGIOGRAPHY CHEST WITH CONTRAST TECHNIQUE: Multidetector CT imaging of the chest was performed using the standard protocol during bolus  administration of intravenous contrast. Multiplanar CT image reconstructions and MIPs were obtained to evaluate the vascular anatomy. CONTRAST:  38m OMNIPAQUE IOHEXOL 350 MG/ML SOLN COMPARISON:  09/03/2019 chest x-ray FINDINGS: Cardiovascular: Filling defects are seen in the lower lobe pulmonary arteries bilaterally compatible with segmental and subsegmental pulmonary emboli. No evidence of right heart strain. Heart is borderline in size. Aorta is normal caliber. Mediastinum/Nodes: Small hiatal hernia. Small scattered reactive mediastinal lymph nodes. No mediastinal, hilar, or axillary adenopathy. Thyroid unremarkable. Lungs/Pleura: Extensive bilateral ground-glass airspace opacities within the lungs. No effusions. Upper Abdomen: Imaging into the upper abdomen shows no acute findings. Musculoskeletal: Chest wall soft tissues are unremarkable. No acute bony abnormality. Review of the MIP images confirms the above findings. IMPRESSION: Small bilateral segmental and subsegmental pulmonary emboli in the lower lobes. No evidence of right heart strain. Extensive bilateral ground-glass airspace opacities compatible with COVID pneumonia. Small hiatal hernia. Electronically Signed   By: KRolm BaptiseM.D.   On: 09/04/2019 00:53   DG Chest Port 1 View  Result Date: 09/03/2019 CLINICAL DATA:  COVID-19 pneumonia EXAM: PORTABLE CHEST 1 VIEW COMPARISON:  05/25/2011 FINDINGS: There are diffuse bilateral hazy airspace opacities consistent with the patient's history of COVID-19 pneumonia. The lung volumes are low. The heart size is normal. There may be trace to small bilateral pleural effusions. There is no acute osseous abnormality. IMPRESSION: Hazy bilateral airspace opacities consistent with the patient's history of viral pneumonia. Electronically Signed   By: CConstance HolsterM.D.   On: 09/03/2019 19:24

## 2019-09-07 NOTE — Plan of Care (Signed)
Pt's wife updated by MD. VSS on 2L [place]. IS and flutter valve encouraged. Lat dose of remdesivir given.  Problem: Education: Goal: Knowledge of General Education information will improve Description: Including pain rating scale, medication(s)/side effects and non-pharmacologic comfort measures Outcome: Progressing   Problem: Health Behavior/Discharge Planning: Goal: Ability to manage health-related needs will improve Outcome: Progressing   Problem: Clinical Measurements: Goal: Ability to maintain clinical measurements within normal limits will improve Outcome: Progressing Goal: Will remain free from infection Outcome: Progressing Goal: Diagnostic test results will improve Outcome: Progressing Goal: Respiratory complications will improve Outcome: Progressing Goal: Cardiovascular complication will be avoided Outcome: Progressing   Problem: Activity: Goal: Risk for activity intolerance will decrease Outcome: Progressing   Problem: Nutrition: Goal: Adequate nutrition will be maintained Outcome: Progressing   Problem: Coping: Goal: Level of anxiety will decrease Outcome: Progressing   Problem: Elimination: Goal: Will not experience complications related to bowel motility Outcome: Progressing Goal: Will not experience complications related to urinary retention Outcome: Progressing   Problem: Pain Managment: Goal: General experience of comfort will improve Outcome: Progressing   Problem: Safety: Goal: Ability to remain free from injury will improve Outcome: Progressing   Problem: Skin Integrity: Goal: Risk for impaired skin integrity will decrease Outcome: Progressing

## 2019-09-07 NOTE — Plan of Care (Signed)

## 2019-09-07 NOTE — Progress Notes (Signed)
ANTICOAGULATION CONSULT NOTE  Pharmacy Consult for Lovenox>>Eliquis Indication: pulmonary embolus  No Known Allergies  Patient Measurements: Height: 5\' 11"  (180.3 cm) Weight: 274 lb 7.6 oz (124.5 kg) IBW/kg (Calculated) : 75.3 Heparin Dosing Weight: 103.2 kg  Vital Signs: Temp: 98.2 F (36.8 C) (01/28 0719) Temp Source: Oral (01/28 0719) BP: 119/85 (01/28 0719) Pulse Rate: 68 (01/28 0719)  Labs: Recent Labs    09/05/19 0350 09/05/19 0350 09/06/19 0420 09/07/19 0222  HGB 13.4   < > 13.3 15.0  HCT 39.7  --  39.7 44.7  PLT 286  --  347 370  CREATININE 0.84  --  0.80 0.87   < > = values in this interval not displayed.    Estimated Creatinine Clearance: 119.8 mL/min (by C-G formula based on SCr of 0.87 mg/dL).   Medical History: Past Medical History:  Diagnosis Date  . Arthritis   . Hypertension   . Obesity     Medications:  Scheduled:  . albuterol  2 puff Inhalation Q6H  . apixaban  10 mg Oral BID   Followed by  . [START ON 09/14/2019] apixaban  5 mg Oral BID  . vitamin C  500 mg Oral Daily  . aspirin  81 mg Oral Daily  . insulin aspart  0-15 Units Subcutaneous TID WC  . insulin aspart  0-5 Units Subcutaneous QHS  . latanoprost  1 drop Both Eyes Daily  . methylPREDNISolone (SOLU-MEDROL) injection  60 mg Intravenous Daily  . omega-3 acid ethyl esters  1 g Oral Daily  . zinc sulfate  220 mg Oral Daily    Assessment: 64 y/o M admitted with COVID-19 PNA. CTA chest revealed PE. Pharmacy consulted to transition from Lovenox to Eliquis  Plan:  -Initiate Eliquis 10 mg bid x 7 days followed by 5 mg bid thereafter -Will educate patient/family prior to discharge -Will sign off and monitor remotely   Thank you for the consult.   77 D 09/07/2019,7:48 AM

## 2019-09-07 NOTE — Progress Notes (Signed)
Inpatient Diabetes Program Recommendations  AACE/ADA: New Consensus Statement on Inpatient Glycemic Control (2015)  Target Ranges:  Prepandial:   less than 140 mg/dL      Peak postprandial:   less than 180 mg/dL (1-2 hours)      Critically ill patients:  140 - 180 mg/dL   Lab Results  Component Value Date   GLUCAP 195 (H) 09/07/2019   HGBA1C 6.3 (H) 09/06/2019    Review of Glycemic Control Results for KEL, SENN (MRN [id number]) as of 09/07/2019 10:09  Ref. Range 09/06/2019 16:22 09/06/2019 21:02 09/07/2019 07:20  Glucose-Capillary Latest Ref Range: 70 - 99 mg/dL 034 (H) 742 (H) 595 (H)   Diabetes history: no hx noted Outpatient Diabetes medications: none Current orders for Inpatient glycemic control: Novolog 0-15 units TID, Novolog 0-5 units QHS Solumedrol 60 mg QD  Inpatient Diabetes Program Recommendations:    In the setting of steroids, Consider increasing correction to Novolog 0-20 units TID.   Thanks, Lujean Rave, MSN, RNC-OB Diabetes Coordinator [phone] (8a-5p)

## 2019-09-08 DIAGNOSIS — I2699 Other pulmonary embolism without acute cor pulmonale: Secondary | ICD-10-CM

## 2019-09-08 LAB — CULTURE, BLOOD (ROUTINE X 2)
Culture: NO GROWTH
Culture: NO GROWTH
Special Requests: ADEQUATE

## 2019-09-08 LAB — CBC WITH DIFFERENTIAL/PLATELET
Abs Immature Granulocytes: 0.63 10*3/uL — ABNORMAL HIGH (ref 0.00–0.07)
Basophils Absolute: 0.1 10*3/uL (ref 0.0–0.1)
Basophils Relative: 0 %
Eosinophils Absolute: 0 10*3/uL (ref 0.0–0.5)
Eosinophils Relative: 0 %
HCT: 41.4 % (ref 39.0–52.0)
Hemoglobin: 14 g/dL (ref 13.0–17.0)
Immature Granulocytes: 4 %
Lymphocytes Relative: 13 %
Lymphs Abs: 1.9 10*3/uL (ref 0.7–4.0)
MCH: 29 pg (ref 26.0–34.0)
MCHC: 33.8 g/dL (ref 30.0–36.0)
MCV: 85.9 fL (ref 80.0–100.0)
Monocytes Absolute: 0.5 10*3/uL (ref 0.1–1.0)
Monocytes Relative: 3 %
Neutro Abs: 12.1 10*3/uL — ABNORMAL HIGH (ref 1.7–7.7)
Neutrophils Relative %: 80 %
Platelets: 367 10*3/uL (ref 150–400)
RBC: 4.82 MIL/uL (ref 4.22–5.81)
RDW: 13.5 % (ref 11.5–15.5)
WBC: 15.2 10*3/uL — ABNORMAL HIGH (ref 4.0–10.5)
nRBC: 0.1 % (ref 0.0–0.2)

## 2019-09-08 LAB — COMPREHENSIVE METABOLIC PANEL
ALT: 46 U/L — ABNORMAL HIGH (ref 0–44)
AST: 24 U/L (ref 15–41)
Albumin: 2.9 g/dL — ABNORMAL LOW (ref 3.5–5.0)
Alkaline Phosphatase: 53 U/L (ref 38–126)
Anion gap: 8 (ref 5–15)
BUN: 32 mg/dL — ABNORMAL HIGH (ref 8–23)
CO2: 23 mmol/L (ref 22–32)
Calcium: 8.4 mg/dL — ABNORMAL LOW (ref 8.9–10.3)
Chloride: 103 mmol/L (ref 98–111)
Creatinine, Ser: 0.83 mg/dL (ref 0.61–1.24)
GFR calc Af Amer: >60 mL/min (ref >60)
GFR calc non Af Amer: >60 mL/min (ref >60)
Glucose, Bld: 136 mg/dL — ABNORMAL HIGH (ref 70–99)
Potassium: 4.5 mmol/L (ref 3.5–5.1)
Sodium: 134 mmol/L — ABNORMAL LOW (ref 135–145)
Total Bilirubin: 0.7 mg/dL (ref 0.3–1.2)
Total Protein: 6.2 g/dL — ABNORMAL LOW (ref 6.5–8.1)

## 2019-09-08 LAB — GLUCOSE, CAPILLARY
Glucose-Capillary: 92 mg/dL (ref 70–99)
Glucose-Capillary: 135 mg/dL — ABNORMAL HIGH (ref 70–99)
Glucose-Capillary: 205 mg/dL — ABNORMAL HIGH (ref 70–99)
Glucose-Capillary: 271 mg/dL — ABNORMAL HIGH (ref 70–99)

## 2019-09-08 LAB — C-REACTIVE PROTEIN: CRP: 2.4 mg/dL — ABNORMAL HIGH (ref <1.0)

## 2019-09-08 LAB — BRAIN NATRIURETIC PEPTIDE: B Natriuretic Peptide: 64.9 pg/mL (ref 0.0–100.0)

## 2019-09-08 LAB — D-DIMER, QUANTITATIVE: D-Dimer, Quant: 3.05 ug/mL-FEU — ABNORMAL HIGH (ref 0.00–0.50)

## 2019-09-08 LAB — MAGNESIUM: Magnesium: 2.2 mg/dL (ref 1.7–2.4)

## 2019-09-08 LAB — PROCALCITONIN: Procalcitonin: <0.1 ng/mL

## 2019-09-08 MED ORDER — FLUTICASONE PROPIONATE 50 MCG/ACT NA SUSP
2.0000 | Freq: Every day | NASAL | Status: DC
  Administered 2019-09-08 – 2019-09-09 (×2): 2 via NASAL
  Filled 2019-09-08: qty 16

## 2019-09-08 MED ORDER — INSULIN DETEMIR 100 UNIT/ML ~~LOC~~ SOLN
5.0000 [IU] | Freq: Every day | SUBCUTANEOUS | Status: DC
  Administered 2019-09-08 – 2019-09-09 (×2): 5 [IU] via SUBCUTANEOUS
  Filled 2019-09-08 (×2): qty 0.05

## 2019-09-08 MED ORDER — SALINE SPRAY 0.65 % NA SOLN
1.0000 | NASAL | Status: DC | PRN
  Administered 2019-09-08 – 2019-09-09 (×2): 1 via NASAL
  Filled 2019-09-08: qty 44

## 2019-09-08 NOTE — Progress Notes (Signed)
PROGRESS NOTE                                                                                                                                                                                                             Patient Demographics:    Roy Lee, is a 64 y.o. male, DOB - 08-24-1955, KYH:[id number]  Outpatient Primary MD for the patient is Scifres, Durel Salts    LOS - 5  Admit date - 09/03/2019    Chief Complaint  Patient presents with  . COVID POSITIVE  . Shortness of Breath       Brief Narrative  Roy Lee is a 64 y.o. male with medical history significant of essential hypertension, chronic arthritis and obesity.  Patient presents to the emergency room 4 days after testing positive for Covid with worsening shortness of breath.   Subjective:   Patient in bed, appears comfortable, denies any headache, no fever, no chest pain or pressure, reports significant dyspnea yesterday on ambulation .  Will he report poor night sleep .   Assessment  & Plan :    Acute Hypoxic Resp. Failure due to Acute Covid 19 Viral Pneumonitis during the ongoing 2020 Covid 19 Pandemic  - he has severe disease on presentation, requiring 15 L high flow, he is currently on 2 L at rest.  He remains quite dyspneic, tachycardic with activity, requiring up to 6 L with activity yesterday, but this has improved today. -Treated with IV remdesivir. -Continue with IV steroids, will change to Decadron p.o. tomorrow -Received Actemra. -Was encouraged use incentive spirometry, flutter valve, and to prone, patient is very anxious about ambulation, discussed at length with him, encouraged to take small steps initially, and to continue to progress as he feels more comfortable, have explained for him likely need oxygen on discharge, likely will need increased oxygen supplement with activity. -CRP trending down which is reassuring.     SpO2: 94  % O2 Flow Rate (L/min): 2 L/min  Recent Labs  Lab 09/03/19 1848 09/03/19 1848 09/04/19 0725 09/05/19 0350 09/06/19 0420 09/07/19 0222 09/08/19 0213  CRP 19.6*   < > 20.0* 14.8* 7.6* 4.8* 2.4*  DDIMER 3.31*   < > 6.78* 7.02* 3.84* 3.07* 3.05*  FERRITIN 969*  --   --   --   --   --   --  BNP  --   --  60.7 48.0 70.9 84.5 64.9  PROCALCITON <0.10   < > <0.10 <0.10 0.19 0.11 <0.10   < > = values in this interval not displayed.    Hepatic Function Latest Ref Rng & Units 09/08/2019 09/07/2019 09/06/2019  Total Protein 6.5 - 8.1 g/dL 6.2(L) 7.1 6.6  Albumin 3.5 - 5.0 g/dL 2.9(L) 3.1(L) 2.8(L)  AST 15 - 41 U/L _0 ALT 0 - 44 U/L 46(H) 50(H) 53(H)  Alk Phosphatase 38 - 126 U/L 53 54 41  Total Bilirubin 0.3 - 1.2 mg/dL 0.7 0.8 0.7   OSA -She reports he has history of OSA, as well reports some worsening dyspnea overnight, reports he has CPAP at home, he has been using intermittently, with some increased oxygen requirement, will start on CPAP nightly   Bilateral PEs. -CTA chest is significant for bilateral PE, with no right heart strain, he is on full dose Lovenox, currently on Eliquis  Essential hypertension.   - Blood pressure slightly low on 09/05/2019, hold blood pressure medications gently hydrate and monitor.  Obesity.  BMI of 38.  Follow with PCP for weight loss.   Hyperglycemia -  secondary to COVID-19 and steroids -, A1c is 6.3, diagnostic of prediabetes, will change to carb modified diet, continue with sliding scale  Condition -   Guarded  Family Communication  : Discussed with daughter via phone  Code Status :  Full  Diet :   Diet Order            Diet heart healthy/carb modified Room service appropriate? Yes; Fluid consistency: Thin  Diet effective now               Disposition Plan  : Home health, still quite symptomatic with minimal activity, will continue to ambulate today, hopefully he regains more strength to be ready for discharge in 24 hours  .  Consults  : None  Procedures  :    CT angiogram. Small bilateral segmental and subsegmental pulmonary emboli in the lower lobes. No evidence of right heart strain. Extensive bilateral ground-glass airspace opacities compatible with COVID pneumonia. Small hiatal hernia.  PUD Prophylaxis : None  DVT Prophylaxis  :  Lovenox    Lab Results  Component Value Date   PLT 367 09/08/2019    Inpatient Medications  Scheduled Meds: . albuterol  2 puff Inhalation Q6H  . apixaban  10 mg Oral BID   Followed by  . [START ON 09/14/2019] apixaban  5 mg Oral BID  . vitamin C  500 mg Oral Daily  . aspirin  81 mg Oral Daily  . fluticasone  2 spray Each Nare Daily  . insulin aspart  0-15 Units Subcutaneous TID WC  . insulin aspart  0-5 Units Subcutaneous QHS  . insulin detemir  5 Units Subcutaneous Daily  . latanoprost  1 drop Both Eyes Daily  . methylPREDNISolone (SOLU-MEDROL) injection  60 mg Intravenous Daily  . omega-3 acid ethyl esters  1 g Oral Daily  . zinc sulfate  220 mg Oral Daily   Continuous Infusions:  PRN Meds:.acetaminophen, alum & mag hydroxide-simeth, chlorpheniramine-HYDROcodone, guaiFENesin-dextromethorphan, [DISCONTINUED] ondansetron **OR** ondansetron (ZOFRAN) IV, senna-docusate, sodium chloride  Antibiotics  :    Anti-infectives (From admission, onward)   Start     Dose/Rate Route Frequency Ordered Stop   09/05/19 1000  remdesivir 100 mg in sodium chloride 0.9 % 100 mL IVPB  Status:  Discontinued     100 mg  200 mL/hr over 30 Minutes Intravenous Daily 09/04/19 0637 09/04/19 0646   09/04/19 1000  remdesivir 100 mg in sodium chloride 0.9 % 100 mL IVPB     100 mg 200 mL/hr over 30 Minutes Intravenous Daily 09/03/19 2115 09/07/19 0947   09/04/19 0645  remdesivir 200 mg in sodium chloride 0.9% 250 mL IVPB  Status:  Discontinued     200 mg 580 mL/hr over 30 Minutes Intravenous Once 09/04/19 6387 09/04/19 0646   09/03/19 2200  remdesivir 200 mg in sodium chloride 0.9% 250  mL IVPB     200 mg 580 mL/hr over 30 Minutes Intravenous Once 09/03/19 2115 09/03/19 2217         Emeline Gins Nimco Bivens M.D on 09/08/2019 at 3:27 PM  To page go to [email or web address] - password [facility]  [facility] -  Office  [phone]    See all Orders from today for further details    Objective:   Vitals:   09/08/19 0445 09/08/19 0500 09/08/19 0742 09/08/19 1201  BP: 106/76  115/79 130/85  Pulse: 64  64 68  Resp: _0 Temp: 98.8 F (37.1 C)  98.3 F (36.8 C) 98.2 F (36.8 C)  TempSrc: Oral  Oral Oral  SpO2: 96%  96% 94%  Weight:      Height:        Wt Readings from Last 3 Encounters:  09/04/19 124.5 kg  12/18/15 129.7 kg     Intake/Output Summary (Last 24 hours) at 09/08/2019 1527 Last data filed at 09/08/2019 0955 Gross per 24 hour  Intake 240 ml  Output --  Net 240 ml     Physical Exam  Awake Alert, Oriented X 3, No new F.N deficits, Normal affect Symmetrical Chest wall movement, Good air movement bilaterally, CTAB RRR,No Gallops,Rubs or new Murmurs, No Parasternal Heave +ve B.Sounds, Abd Soft, No tenderness, No rebound - guarding or rigidity. No Cyanosis, Clubbing or edema, No new Rash or bruise       Data Review:    CBC Recent Labs  Lab 09/03/19 1848 09/03/19 1848 09/04/19 0725 09/05/19 0350 09/06/19 0420 09/07/19 0222 09/08/19 0213  WBC 6.5   < > 4.4 11.7* 14.9* 16.4* 15.2*  HGB 15.2   < > 13.5 13.4 13.3 15.0 14.0  HCT 44.2   < > 39.9 39.7 39.7 44.7 41.4  PLT 263   < > 238 286 347 370 367  MCV 86.3   < > 86.2 85.9 87.1 87.0 85.9  MCH 29.7   < > 29.2 29.0 29.2 29.2 29.0  MCHC 34.4   < > 33.8 33.8 33.5 33.6 33.8  RDW 14.1   < > 14.0 13.8 13.8 13.5 13.5  LYMPHSABS 0.9  --   --  1.1 0.9 0.9 1.9  MONOABS 0.6  --   --  0.4 0.5 0.4 0.5  EOSABS 0.0  --   --  0.0 0.0 0.0 0.0  BASOSABS 0.0  --   --  0.0 0.0 0.0 0.1   < > = values in this interval not displayed.    Chemistries  Recent Labs  Lab 09/03/19 1848 09/03/19 1848  09/04/19 0725 09/05/19 0350 09/06/19 0420 09/07/19 0222 09/08/19 0213  NA 137   < > 138 135 137 135 134*  K 4.1   < > 4.3 4.5 4.9 4.9 4.5  CL 103   < > 106 103 103 101 103  CO2 22   < > _1 21* 23  GLUCOSE 122*   < > 152* 214* 252* 193* 136*  BUN 26*   < > 26* 36* 36* 32* 32*  CREATININE 0.82   < > 0.71 0.84 0.80 0.87 0.83  CALCIUM 8.8*   < > 8.5* 8.9 8.9 8.9 8.4*  MG  --   --  2.4 2.2 2.3 2.2 2.2  AST 51*  --   --  38 _0 ALT 58*  --   --  67* 53* 50* 46*  ALKPHOS 39  --   --  35* 41 54 53  BILITOT 0.8  --   --  0.4 0.7 0.8 0.7   < > = values in this interval not displayed.   ------------------------------------------------------------------------------------------------------------------ No results for input(s): CHOL, HDL, LDLCALC, TRIG, CHOLHDL, LDLDIRECT in the last 72 hours.  Lab Results  Component Value Date   HGBA1C 6.3 (H) 09/06/2019   ------------------------------------------------------------------------------------------------------------------ No results for input(s): TSH, T4TOTAL, T3FREE, THYROIDAB in the last 72 hours.  Invalid input(s): FREET3  Cardiac Enzymes No results for input(s): CKMB, TROPONINI, MYOGLOBIN in the last 168 hours.  Invalid input(s): CK ------------------------------------------------------------------------------------------------------------------    Component Value Date/Time   BNP 64.9 09/08/2019 0213    Micro Results Recent Results (from the past 240 hour(s))  Blood Culture (routine x 2)     Status: None   Collection Time: 09/03/19  6:48 PM   Specimen: BLOOD  Result Value Ref Range Status   Specimen Description BLOOD RIGHT ANTECUBITAL  Final   Special Requests   Final    BOTTLES DRAWN AEROBIC AND ANAEROBIC Blood Culture results may not be optimal due to an excessive volume of blood received in culture bottles Performed at [facility], [place] [address], [place], [place] [address]    Culture    Final    NO GROWTH 5 DAYS Performed at [facility], [place] [address] [place], [place] [address]    Report Status 09/08/2019 FINAL  Final  Blood Culture (routine x 2)     Status: None   Collection Time: 09/03/19  6:48 PM   Specimen: BLOOD RIGHT FOREARM  Result Value Ref Range Status   Specimen Description BLOOD RIGHT FOREARM  Final   Special Requests   Final    BOTTLES DRAWN AEROBIC AND ANAEROBIC Blood Culture adequate volume Performed at [place] [address] [place], [place] [address]    Culture   Final    NO GROWTH 5 DAYS Performed at [facility], [place] [address] [place], [place] [address]    Report Status 09/08/2019 FINAL  Final    Radiology Reports CT ANGIO CHEST PE W OR WO CONTRAST  Result Date: 09/04/2019 CLINICAL DATA:  COVID positive.  Shortness of breath EXAM: CT ANGIOGRAPHY CHEST WITH CONTRAST TECHNIQUE: Multidetector CT imaging of the chest was performed using the standard protocol during bolus administration of intravenous contrast. Multiplanar CT image reconstructions and MIPs were obtained to evaluate the vascular anatomy. CONTRAST:  52m OMNIPAQUE IOHEXOL 350 MG/ML SOLN COMPARISON:  09/03/2019 chest x-ray FINDINGS: Cardiovascular: Filling defects are seen in the lower lobe pulmonary arteries bilaterally compatible with segmental and subsegmental pulmonary emboli. No evidence of right heart strain. Heart is borderline in size. Aorta is normal caliber. Mediastinum/Nodes: Small hiatal hernia. Small scattered reactive mediastinal lymph nodes. No mediastinal, hilar, or axillary adenopathy. Thyroid unremarkable. Lungs/Pleura: Extensive bilateral ground-glass airspace opacities within the lungs. No effusions. Upper Abdomen: Imaging into the upper abdomen shows no acute  findings. Musculoskeletal: Chest wall soft tissues are unremarkable. No acute bony abnormality. Review of the MIP images confirms the above findings. IMPRESSION: Small bilateral  segmental and subsegmental pulmonary emboli in the lower lobes. No evidence of right heart strain. Extensive bilateral ground-glass airspace opacities compatible with COVID pneumonia. Small hiatal hernia. Electronically Signed   By: Rolm Baptise M.D.   On: 09/04/2019 00:53   DG Chest Port 1 View  Result Date: 09/03/2019 CLINICAL DATA:  COVID-19 pneumonia EXAM: PORTABLE CHEST 1 VIEW COMPARISON:  05/25/2011 FINDINGS: There are diffuse bilateral hazy airspace opacities consistent with the patient's history of COVID-19 pneumonia. The lung volumes are low. The heart size is normal. There may be trace to small bilateral pleural effusions. There is no acute osseous abnormality. IMPRESSION: Hazy bilateral airspace opacities consistent with the patient's history of viral pneumonia. Electronically Signed   By: Constance Holster M.D.   On: 09/03/2019 19:24

## 2019-09-08 NOTE — Progress Notes (Signed)
Occupational Therapy Treatment Patient Details Name: Roy Lee MRN: [id number] DOB: Mar 26, 1956 Today's Date: 09/08/2019    History of present illness Pt is a 64 y.o. male admitted 09/03/19 with worsening SOB, had tested (+) for COVID-19 four days prior. Worked up for acute hypoxic respiratory failure secondary to COVID-19 viral pneumonitis requiring HFNC. CT angiogram with small bilateral PEs, no evidence of heart strain. PMH includes HTN, arthritis, obesity.   OT comments  Pt sitting in chair on arrival on 2L O2 on 2 extensions with SpO2 in 90s. Increased to 3L during mobility and ADL activity. SpO2 ranged form 87-mid 90s during session. Pt requires VC for pursed lip breathing. Pt states "I'm scared". Pt educated on anxiety management techniques. Pt states he plans to stay in an Air bnb after DC. Recommend HHOT follow up given limited support at DC. Will continue ot follow acutely.   Follow Up Recommendations  Supervision - Intermittent;Home health OT    Equipment Recommendations  3 in 1 bedside commode    Recommendations for Other Services Other (comment)    Precautions / Restrictions         Mobility Bed Mobility Overal bed mobility: Independent                Transfers Overall transfer level: Modified independent                    Balance                                           ADL either performed or assessed with clinical judgement   ADL Overall ADL's : Needs assistance/impaired                                     Functional mobility during ADLs: Supervision/safety General ADL Comments: Educated on energy conservation strategies and pursed lip breathing during ADL. Issued reacher, sock aid and long handled sponge. Incorporated AE into ADL - pt reports AE helps with ADL management.     Vision       Perception     Praxis      Cognition Arousal/Alertness: Awake/alert Behavior During Therapy:  Anxious Overall Cognitive Status: Within Functional Limits for tasks assessed                                          Exercises     Shoulder Instructions       General Comments      Pertinent Vitals/ Pain       Pain Assessment: Faces Faces Pain Scale: Hurts a little bit Pain Location: general discomfort Pain Descriptors / Indicators: Discomfort Pain Intervention(s): Limited activity within patient's tolerance  Home Living                                          Prior Functioning/Environment              Frequency  Min 3X/week        Progress Toward Goals  OT Goals(current goals can now be found in the care plan section)  Progress  towards OT goals: Progressing toward goals  Acute Rehab OT Goals Patient Stated Goal: to get better OT Goal Formulation: With patient Time For Goal Achievement: 09/19/19 Potential to Achieve Goals: Good ADL Goals Pt Will Perform Lower Body Bathing: with modified independence;with adaptive equipment;sit to/from stand Pt Will Perform Lower Body Dressing: with modified independence;sit to/from stand;with adaptive equipment Pt/caregiver will Perform Home Exercise Program: Increased strength;Independently;With theraband;With written HEP provided Additional ADL Goal #1: Pt will indepedently verbalize 3 energy conservation strategies  Plan Discharge plan needs to be updated    Co-evaluation                 AM-PAC OT "6 Clicks" Daily Activity     Outcome Measure   Help from another person eating meals?: None Help from another person taking care of personal grooming?: A Little Help from another person toileting, which includes using toliet, bedpan, or urinal?: A Little Help from another person bathing (including washing, rinsing, drying)?: A Little Help from another person to put on and taking off regular upper body clothing?: A Little Help from another person to put on and taking off regular  lower body clothing?: A Little 6 Click Score: 19    End of Session Equipment Utilized During Treatment: Oxygen(3L)  OT Visit Diagnosis: Unsteadiness on feet (R26.81)   Activity Tolerance Patient tolerated treatment well   Patient Left Other (comment)(on toilet - nsg in room)   Nurse Communication Mobility status        Time: 9629-5284 OT Time Calculation (min): 50 min  Charges: OT General Charges $OT Visit: 1 Visit OT Treatments $Self Care/Home Management : 38-52 mins  Luisa Dago, OT/L   Acute OT Clinical Specialist Acute Rehabilitation Services Pager [phone] Office [phone]    [facility] 09/08/2019, 1:53 PM

## 2019-09-08 NOTE — Plan of Care (Signed)
Pt reports restless night and being tired today. VSS. Pt ambulated in room with RN and performed ADLs with OT; tolerated well. Long-acting insulin added. Pt verbalized understanding of diet and lifestyle modifications with teach back. Pt updated family. Will continue POC.   Problem: Education: Goal: Knowledge of General Education information will improve Description: Including pain rating scale, medication(s)/side effects and non-pharmacologic comfort measures Outcome: Progressing   Problem: Health Behavior/Discharge Planning: Goal: Ability to manage health-related needs will improve Outcome: Progressing   Problem: Clinical Measurements: Goal: Ability to maintain clinical measurements within normal limits will improve Outcome: Progressing Goal: Will remain free from infection Outcome: Progressing Goal: Diagnostic test results will improve Outcome: Progressing Goal: Respiratory complications will improve Outcome: Progressing Goal: Cardiovascular complication will be avoided Outcome: Progressing   Problem: Activity: Goal: Risk for activity intolerance will decrease Outcome: Progressing   Problem: Nutrition: Goal: Adequate nutrition will be maintained Outcome: Progressing   Problem: Coping: Goal: Level of anxiety will decrease Outcome: Progressing   Problem: Elimination: Goal: Will not experience complications related to bowel motility Outcome: Progressing Goal: Will not experience complications related to urinary retention Outcome: Progressing   Problem: Pain Managment: Goal: General experience of comfort will improve Outcome: Progressing   Problem: Safety: Goal: Ability to remain free from injury will improve Outcome: Progressing   Problem: Skin Integrity: Goal: Risk for impaired skin integrity will decrease Outcome: Progressing

## 2019-09-08 NOTE — Plan of Care (Signed)

## 2019-09-09 DIAGNOSIS — I10 Essential (primary) hypertension: Secondary | ICD-10-CM

## 2019-09-09 LAB — GLUCOSE, CAPILLARY
Glucose-Capillary: 111 mg/dL — ABNORMAL HIGH (ref 70–99)
Glucose-Capillary: 166 mg/dL — ABNORMAL HIGH (ref 70–99)
Glucose-Capillary: 219 mg/dL — ABNORMAL HIGH (ref 70–99)

## 2019-09-09 MED ORDER — PANTOPRAZOLE SODIUM 40 MG PO TBEC: 40.0000 mg | DELAYED_RELEASE_TABLET | Freq: Every day | ORAL | 0 refills | Status: DC

## 2019-09-09 MED ORDER — DEXAMETHASONE 6 MG PO TABS: 6.0000 mg | ORAL_TABLET | Freq: Every day | ORAL | 0 refills | Status: DC

## 2019-09-09 MED ORDER — DEXAMETHASONE 4 MG PO TABS: 8.0000 mg | ORAL_TABLET | Freq: Every day | ORAL | 0 refills | Status: AC

## 2019-09-09 MED ORDER — APIXABAN 5 MG PO TABS: ORAL_TABLET | ORAL | 0 refills | Status: DC

## 2019-09-09 MED ORDER — ACETAMINOPHEN 325 MG PO TABS: 650.0000 mg | ORAL_TABLET | Freq: Four times a day (QID) | ORAL | Status: AC | PRN

## 2019-09-09 MED ORDER — PANTOPRAZOLE SODIUM 40 MG PO TBEC: 40.0000 mg | DELAYED_RELEASE_TABLET | Freq: Every day | ORAL | 0 refills | Status: AC

## 2019-09-09 MED ORDER — APIXABAN 5 MG PO TABS: ORAL_TABLET | ORAL | 0 refills | Status: AC

## 2019-09-09 NOTE — Plan of Care (Signed)
RN updated daughter Roy Lee on plan to d/c today via PTAR with home O2 and home health. Note states pt lives in shed by choice. Daughter informed RN that she has arranged AirBnB for pt to be d/c to, RN informed CM of this. Pt refused CPAP o/n per night RN. Daughter concerned with pt's mood-appears to be depressed. Will alert MD and continue POC until d/c. Pt currently working with PT. VSS.  Problem: Education: Goal: Knowledge of General Education information will improve Description: Including pain rating scale, medication(s)/side effects and non-pharmacologic comfort measures Outcome: Adequate for Discharge   Problem: Health Behavior/Discharge Planning: Goal: Ability to manage health-related needs will improve Outcome: Adequate for Discharge   Problem: Clinical Measurements: Goal: Ability to maintain clinical measurements within normal limits will improve Outcome: Adequate for Discharge Goal: Will remain free from infection Outcome: Adequate for Discharge Goal: Diagnostic test results will improve Outcome: Adequate for Discharge Goal: Respiratory complications will improve Outcome: Adequate for Discharge Goal: Cardiovascular complication will be avoided Outcome: Adequate for Discharge   Problem: Activity: Goal: Risk for activity intolerance will decrease Outcome: Adequate for Discharge   Problem: Nutrition: Goal: Adequate nutrition will be maintained Outcome: Adequate for Discharge   Problem: Coping: Goal: Level of anxiety will decrease Outcome: Adequate for Discharge   Problem: Elimination: Goal: Will not experience complications related to bowel motility Outcome: Adequate for Discharge Goal: Will not experience complications related to urinary retention Outcome: Adequate for Discharge   Problem: Pain Managment: Goal: General experience of comfort will improve Outcome: Adequate for Discharge   Problem: Safety: Goal: Ability to remain free from injury will  improve Outcome: Adequate for Discharge   Problem: Skin Integrity: Goal: Risk for impaired skin integrity will decrease Outcome: Adequate for Discharge

## 2019-09-09 NOTE — Progress Notes (Signed)
Pt discharged home (AirBnB) via wheelchair escorted by RN. VSS. Pt on 4L [place] per request. RN confirmed delivery of O2 with daughter prior to d/c. All questions answered. Belongings returned to pt.

## 2019-09-09 NOTE — Discharge Instructions (Signed)
Information on my medicine - ELIQUIS (apixaban)  This medication education was reviewed with me or my healthcare representative as part of my discharge preparation.  The pharmacist that spoke with me during my hospital stay was:   Why was Eliquis prescribed for you? Eliquis was prescribed to treat blood clots that may have been found in the veins of your legs (deep vein thrombosis) or in your lungs (pulmonary embolism) and to reduce the risk of them occurring again.  What do You need to know about Eliquis ? The starting dose is 10 mg (two 5 mg tablets) taken TWICE daily for the FIRST SEVEN (7) DAYS, then on (enter date)  2/4  the dose is reduced to ONE 5 mg tablet taken TWICE daily.  Eliquis may be taken with or without food.   Try to take the dose about the same time in the morning and in the evening. If you have difficulty swallowing the tablet whole please discuss with your pharmacist how to take the medication safely.  Take Eliquis exactly as prescribed and DO NOT stop taking Eliquis without talking to the doctor who prescribed the medication.  Stopping may increase your risk of developing a new blood clot.  Refill your prescription before you run out.  After discharge, you should have regular check-up appointments with your healthcare provider that is prescribing your Eliquis.    What do you do if you miss a dose? If a dose of ELIQUIS is not taken at the scheduled time, take it as soon as possible on the same day and twice-daily administration should be resumed. The dose should not be doubled to make up for a missed dose.  Important Safety Information A possible side effect of Eliquis is bleeding. You should call your healthcare provider right away if you experience any of the following: ? Bleeding from an injury or your nose that does not stop. ? Unusual colored urine (red or dark brown) or unusual colored stools (red or black). ? Unusual bruising for unknown reasons. ? A  serious fall or if you hit your head (even if there is no bleeding).  Some medicines may interact with Eliquis and might increase your risk of bleeding or clotting while on Eliquis. To help avoid this, consult your healthcare provider or pharmacist prior to using any new prescription or non-prescription medications, including herbals, vitamins, non-steroidal anti-inflammatory drugs (NSAIDs) and supplements.  This website has more information on Eliquis (apixaban): http://www.eliquis.com/eliquis/home    COVID-19 COVID-19 is a respiratory infection that is caused by a virus called severe acute respiratory syndrome coronavirus 2 (SARS-CoV-2). The disease is also known as coronavirus disease or novel coronavirus. In some people, the virus may not cause any symptoms. In others, it may cause a serious infection. The infection can get worse quickly and can lead to complications, such as:  Pneumonia, or infection of the lungs.  Acute respiratory distress syndrome or ARDS. This is a condition in which fluid build-up in the lungs prevents the lungs from filling with air and passing oxygen into the blood.  Acute respiratory failure. This is a condition in which there is not enough oxygen passing from the lungs to the body or when carbon dioxide is not passing from the lungs out of the body.  Sepsis or septic shock. This is a serious bodily reaction to an infection.  Blood clotting problems.  Secondary infections due to bacteria or fungus.  Organ failure. This is when your body's organs stop working. The virus that  causes COVID-19 is contagious. This means that it can spread from person to person through droplets from coughs and sneezes (respiratory secretions). What are the causes? This illness is caused by a virus. You may catch the virus by:  Breathing in droplets from an infected person. Droplets can be spread by a person breathing, speaking, singing, coughing, or sneezing.  Touching  something, like a table or a doorknob, that was exposed to the virus (contaminated) and then touching your mouth, nose, or eyes. What increases the risk? Risk for infection You are more likely to be infected with this virus if you:  Are within 6 feet (2 meters) of a person with COVID-19.  Provide care for or live with a person who is infected with COVID-19.  Spend time in crowded indoor spaces or live in shared housing. Risk for serious illness You are more likely to become seriously ill from the virus if you:  Are 24 years of age or older. The higher your age, the more you are at risk for serious illness.  Live in a nursing home or long-term care facility.  Have cancer.  Have a long-term (chronic) disease such as: ? Chronic lung disease, including chronic obstructive pulmonary disease or asthma. ? A long-term disease that lowers your body's ability to fight infection (immunocompromised). ? Heart disease, including heart failure, a condition in which the arteries that lead to the heart become narrow or blocked (coronary artery disease), a disease which makes the heart muscle thick, weak, or stiff (cardiomyopathy). ? Diabetes. ? Chronic kidney disease. ? Sickle cell disease, a condition in which red blood cells have an abnormal "sickle" shape. ? Liver disease.  Are obese. What are the signs or symptoms? Symptoms of this condition can range from mild to severe. Symptoms may appear any time from 2 to 14 days after being exposed to the virus. They include:  A fever or chills.  A cough.  Difficulty breathing.  Headaches, body aches, or muscle aches.  Runny or stuffy (congested) nose.  A sore throat.  New loss of taste or smell. Some people may also have stomach problems, such as nausea, vomiting, or diarrhea. Other people may not have any symptoms of COVID-19. How is this diagnosed? This condition may be diagnosed based on:  Your signs and symptoms, especially if: ? You  live in an area with a COVID-19 outbreak. ? You recently traveled to or from an area where the virus is common. ? You provide care for or live with a person who was diagnosed with COVID-19. ? You were exposed to a person who was diagnosed with COVID-19.  A physical exam.  Lab tests, which may include: ? Taking a sample of fluid from the back of your nose and throat (nasopharyngeal fluid), your nose, or your throat using a swab. ? A sample of mucus from your lungs (sputum). ? Blood tests.  Imaging tests, which may include, X-rays, CT scan, or ultrasound. How is this treated? At present, there is no medicine to treat COVID-19. Medicines that treat other diseases are being used on a trial basis to see if they are effective against COVID-19. Your health care provider will talk with you about ways to treat your symptoms. For most people, the infection is mild and can be managed at home with rest, fluids, and over-the-counter medicines. Treatment for a serious infection usually takes places in a hospital intensive care unit (ICU). It may include one or more of the following treatments. These treatments  are given until your symptoms improve.  Receiving fluids and medicines through an IV.  Supplemental oxygen. Extra oxygen is given through a tube in the nose, a face mask, or a hood.  Positioning you to lie on your stomach (prone position). This makes it easier for oxygen to get into the lungs.  Continuous positive airway pressure (CPAP) or bi-level positive airway pressure (BPAP) machine. This treatment uses mild air pressure to keep the airways open. A tube that is connected to a motor delivers oxygen to the body.  Ventilator. This treatment moves air into and out of the lungs by using a tube that is placed in your windpipe.  Tracheostomy. This is a procedure to create a hole in the neck so that a breathing tube can be inserted.  Extracorporeal membrane oxygenation (ECMO). This procedure gives  the lungs a chance to recover by taking over the functions of the heart and lungs. It supplies oxygen to the body and removes carbon dioxide. Follow these instructions at home: Lifestyle  If you are sick, stay home except to get medical care. Your health care provider will tell you how long to stay home. Call your health care provider before you go for medical care.  Rest at home as told by your health care provider.  Do not use any products that contain nicotine or tobacco, such as cigarettes, e-cigarettes, and chewing tobacco. If you need help quitting, ask your health care provider.  Return to your normal activities as told by your health care provider. Ask your health care provider what activities are safe for you. General instructions  Take over-the-counter and prescription medicines only as told by your health care provider.  Drink enough fluid to keep your urine pale yellow.  Keep all follow-up visits as told by your health care provider. This is important. How is this prevented?  There is no vaccine to help prevent COVID-19 infection. However, there are steps you can take to protect yourself and others from this virus. To protect yourself:   Do not travel to areas where COVID-19 is a risk. The areas where COVID-19 is reported change often. To identify high-risk areas and travel restrictions, check the CDC travel website: StageSync.si  If you live in, or must travel to, an area where COVID-19 is a risk, take precautions to avoid infection. ? Stay away from people who are sick. ? Wash your hands often with soap and water for 20 seconds. If soap and water are not available, use an alcohol-based hand sanitizer. ? Avoid touching your mouth, face, eyes, or nose. ? Avoid going out in public, follow guidance from your state and local health authorities. ? If you must go out in public, wear a cloth face covering or face mask. Make sure your mask covers your nose and  mouth. ? Avoid crowded indoor spaces. Stay at least 6 feet (2 meters) away from others. ? Disinfect objects and surfaces that are frequently touched every day. This may include:  Counters and tables.  Doorknobs and light switches.  Sinks and faucets.  Electronics, such as phones, remote controls, keyboards, computers, and tablets. To protect others: If you have symptoms of COVID-19, take steps to prevent the virus from spreading to others.  If you think you have a COVID-19 infection, contact your health care provider right away. Tell your health care team that you think you may have a COVID-19 infection.  Stay home. Leave your house only to seek medical care. Do not use public transport.  Do not travel while you are sick.  Wash your hands often with soap and water for 20 seconds. If soap and water are not available, use alcohol-based hand sanitizer.  Stay away from other members of your household. Let healthy household members care for children and pets, if possible. If you have to care for children or pets, wash your hands often and wear a mask. If possible, stay in your own room, separate from others. Use a different bathroom.  Make sure that all people in your household wash their hands well and often.  Cough or sneeze into a tissue or your sleeve or elbow. Do not cough or sneeze into your hand or into the air.  Wear a cloth face covering or face mask. Make sure your mask covers your nose and mouth. Where to find more information  Centers for Disease Control and Prevention: StickerEmporium.tn  World Health Organization: https://thompson-craig.com/ Contact a health care provider if:  You live in or have traveled to an area where COVID-19 is a risk and you have symptoms of the infection.  You have had contact with someone who has COVID-19 and you have symptoms of the infection. Get help right away if:  You have trouble breathing.  You  have pain or pressure in your chest.  You have confusion.  You have bluish lips and fingernails.  You have difficulty waking from sleep.  You have symptoms that get worse. These symptoms may represent a serious problem that is an emergency. Do not wait to see if the symptoms will go away. Get medical help right away. Call your local emergency services (911 in the [place] Do not drive yourself to the hospital. Let the emergency medical personnel know if you think you have COVID-19. Summary  COVID-19 is a respiratory infection that is caused by a virus. It is also known as coronavirus disease or novel coronavirus. It can cause serious infections, such as pneumonia, acute respiratory distress syndrome, acute respiratory failure, or sepsis.  The virus that causes COVID-19 is contagious. This means that it can spread from person to person through droplets from breathing, speaking, singing, coughing, or sneezing.  You are more likely to develop a serious illness if you are 54 years of age or older, have a weak immune system, live in a nursing home, or have chronic disease.  There is no medicine to treat COVID-19. Your health care provider will talk with you about ways to treat your symptoms.  Take steps to protect yourself and others from infection. Wash your hands often and disinfect objects and surfaces that are frequently touched every day. Stay away from people who are sick and wear a mask if you are sick. This information is not intended to replace advice given to you by your health care provider. Make sure you discuss any questions you have with your health care provider. Document Revised: 05/26/2019 Document Reviewed: 09/01/2018 Elsevier Patient Education  2020 [employer].

## 2019-09-09 NOTE — Plan of Care (Signed)
Discharge education provided to pt including Rx, isolation precautions, follow-up appointment, and when to seek emergency assistance. Pt verbalized understanding. All questions answered. Daughter to pick pt up after home O2 has been delivered.  Problem: Education: Goal: Knowledge of General Education information will improve Description: Including pain rating scale, medication(s)/side effects and non-pharmacologic comfort measures 09/09/2019 1353 by Emilia Beck, RN Outcome: Adequate for Discharge 09/09/2019 1048 by Emilia Beck, RN Outcome: Adequate for Discharge   Problem: Health Behavior/Discharge Planning: Goal: Ability to manage health-related needs will improve 09/09/2019 1353 by Emilia Beck, RN Outcome: Adequate for Discharge 09/09/2019 1048 by Emilia Beck, RN Outcome: Adequate for Discharge   Problem: Clinical Measurements: Goal: Ability to maintain clinical measurements within normal limits will improve 09/09/2019 1353 by Emilia Beck, RN Outcome: Adequate for Discharge 09/09/2019 1048 by Emilia Beck, RN Outcome: Adequate for Discharge Goal: Will remain free from infection 09/09/2019 1353 by Emilia Beck, RN Outcome: Adequate for Discharge 09/09/2019 1048 by Emilia Beck, RN Outcome: Adequate for Discharge Goal: Diagnostic test results will improve 09/09/2019 1353 by Emilia Beck, RN Outcome: Adequate for Discharge 09/09/2019 1048 by Emilia Beck, RN Outcome: Adequate for Discharge Goal: Respiratory complications will improve 09/09/2019 1353 by Emilia Beck, RN Outcome: Adequate for Discharge 09/09/2019 1048 by Emilia Beck, RN Outcome: Adequate for Discharge Goal: Cardiovascular complication will be avoided 09/09/2019 1353 by Emilia Beck, RN Outcome: Adequate for Discharge 09/09/2019 1048 by Emilia Beck, RN Outcome: Adequate for Discharge   Problem: Activity: Goal: Risk for activity intolerance will  decrease 09/09/2019 1353 by Emilia Beck, RN Outcome: Adequate for Discharge 09/09/2019 1048 by Emilia Beck, RN Outcome: Adequate for Discharge   Problem: Nutrition: Goal: Adequate nutrition will be maintained 09/09/2019 1353 by Emilia Beck, RN Outcome: Adequate for Discharge 09/09/2019 1048 by Emilia Beck, RN Outcome: Adequate for Discharge   Problem: Coping: Goal: Level of anxiety will decrease 09/09/2019 1353 by Emilia Beck, RN Outcome: Adequate for Discharge 09/09/2019 1048 by Emilia Beck, RN Outcome: Adequate for Discharge   Problem: Elimination: Goal: Will not experience complications related to bowel motility 09/09/2019 1353 by Emilia Beck, RN Outcome: Adequate for Discharge 09/09/2019 1048 by Emilia Beck, RN Outcome: Adequate for Discharge Goal: Will not experience complications related to urinary retention 09/09/2019 1353 by Emilia Beck, RN Outcome: Adequate for Discharge 09/09/2019 1048 by Emilia Beck, RN Outcome: Adequate for Discharge   Problem: Pain Managment: Goal: General experience of comfort will improve 09/09/2019 1353 by Emilia Beck, RN Outcome: Adequate for Discharge 09/09/2019 1048 by Emilia Beck, RN Outcome: Adequate for Discharge   Problem: Safety: Goal: Ability to remain free from injury will improve 09/09/2019 1353 by Emilia Beck, RN Outcome: Adequate for Discharge 09/09/2019 1048 by Emilia Beck, RN Outcome: Adequate for Discharge   Problem: Skin Integrity: Goal: Risk for impaired skin integrity will decrease 09/09/2019 1353 by Emilia Beck, RN Outcome: Adequate for Discharge 09/09/2019 1048 by Emilia Beck, RN Outcome: Adequate for Discharge   Problem: Acute Rehab OT Goals (only OT should resolve) Goal: Pt. Will Perform Lower Body Bathing Outcome: Adequate for Discharge Goal: Pt. Will Perform Lower Body Dressing Outcome: Adequate for Discharge Goal:  Pt/Caregiver Will Perform Home Exercise Program Outcome: Adequate for Discharge Goal: OT Additional ADL Goal #1 Outcome: Adequate for Discharge   Problem: Acute Rehab PT Goals(only PT should resolve) Goal: Pt Will Ambulate  Outcome: Adequate for Discharge Goal: Pt/caregiver will Perform Home Exercise Program Outcome: Adequate for Discharge

## 2019-09-09 NOTE — Progress Notes (Signed)
Physical Therapy Treatment Patient Details Name: Roy Lee MRN: [id number] DOB: [birth year] Today's Date: 09/09/2019    History of Present Illness Pt is a 64 y.o. male admitted 09/03/19 with worsening SOB, had tested (+) for COVID-19 four days prior. Worked up for acute hypoxic respiratory failure secondary to COVID-19 viral pneumonitis requiring HFNC. CT angiogram with small bilateral PEs, no evidence of heart strain. PMH includes HTN, arthritis, obesity.    PT Comments    Functionally pt is doing very well, he needs stand by assist to modified independence with all functional mobility. Pt states that he is scared to go home as he is will be alone when he is so vulnerable and helpless. Therapist spent some time speaking to patient about current functional status and how well he does with mobility, pt does not seem to see this. He is insistent that he can not breathe and at 2L/min via [place] is not getting enough 02 for mobility. Attempted ambulation x 2, on initial attempt piror to even commencing pt stated that he would need a lot more 02 to ambulate, therapist increased 02 to 3L/min but still pt stated he needed more, while satting at 99%. Pt able to ambulate approx 23ft in room with considerable increased work of breathing desated to low 80s with this. Pt returned to chair and was able to recover within less than 1 minute completing pursed lip breathing very efficiently. On second attempt pt placed on 4L/min and was able to ambulate approx 121ft with stand by assist again with considerable increased work of breathing and pt c/o not having any air. Pt again desat to low 80s, at one point was increased to 6L/min but this made no difference in breathing nor saturations. Once back in room and seated in recliner once more was able to recover very quickly, returned to 2L/min, completing pursed lip breathing. Pt just seems to be apprehensive about mobility and as he states his feeling of vulnerability and  helplessness. Pt has been educated on keeping phone close and calling medical professions if anything should happen while at home alone, he stated that he felt he had waited too long prior to going to hospital this time.    Follow Up Recommendations  No PT follow up     Equipment Recommendations  None recommended by PT    Recommendations for Other Services       Precautions / Restrictions Precautions Precautions: Other (comment) Precaution Comments: high anxiety and desat from this Restrictions Weight Bearing Restrictions: No    Mobility  Bed Mobility               General bed mobility comments: pt sitting in recliner at therapist arrival  Transfers Overall transfer level: Modified independent Equipment used: None                Ambulation/Gait Ambulation/Gait assistance: Supervision;Min guard Gait Distance (Feet): 120 Feet Assistive device: 1 person hand held assist Gait Pattern/deviations: Step-through pattern Gait velocity: decreased   General Gait Details: as soon as pt stands from chair he suddenly becomes quite apprehensive and states he cant breathe and is not getting enough air   Stairs             Wheelchair Mobility    Modified Rankin (Stroke Patients Only)       Balance Overall balance assessment: Mild deficits observed, not formally tested  Cognition Arousal/Alertness: Awake/alert Behavior During Therapy: Anxious Overall Cognitive Status: Impaired/Different from baseline Area of Impairment: Safety/judgement                         Safety/Judgement: Decreased awareness of safety            Exercises Other Exercises Other Exercises: pursed lip breathing    General Comments        Pertinent Vitals/Pain Pain Assessment: Faces Faces Pain Scale: Hurts a little bit Pain Location: general discomfort Pain Descriptors / Indicators: Grimacing;Discomfort     Home Living                      Prior Function            PT Goals (current goals can now be found in the care plan section) Acute Rehab PT Goals Patient Stated Goal: states he is scared of going home as he is so vulnerable and helpless.  PT Goal Formulation: With patient Time For Goal Achievement: 09/19/19 Potential to Achieve Goals: Good    Frequency    Min 3X/week      PT Plan Current plan remains appropriate    Co-evaluation              AM-PAC PT "6 Clicks" Mobility   Outcome Measure  Help needed turning from your back to your side while in a flat bed without using bedrails?: None Help needed moving from lying on your back to sitting on the side of a flat bed without using bedrails?: None Help needed moving to and from a bed to a chair (including a wheelchair)?: None Help needed standing up from a chair using your arms (e.g., wheelchair or bedside chair)?: None Help needed to walk in hospital room?: A Little Help needed climbing 3-5 steps with a railing? : A Little 6 Click Score: 22    End of Session Equipment Utilized During Treatment: Oxygen   Patient left: in chair;with call bell/phone within reach Nurse Communication: Mobility status;Other (comment)(pt disposition) PT Visit Diagnosis: Other abnormalities of gait and mobility (R26.89)     Time: 8841-6606 PT Time Calculation (min) (ACUTE ONLY): 47 min  Charges:  $Gait Training: 8-22 mins $Self Care/Home Management: 8-22                     Drema Pry, PT    [name] 09/09/2019, 12:35 PM

## 2019-09-09 NOTE — TOC Transition Note (Signed)
Transition of Care [facility]) - CM/SW Discharge Note   Patient Details  Name: Roy Lee MRN: [id number] Date of Birth: August 29, 1955  Transition of Care [facility]) CM/SW Contact:  Durenda Guthrie, RN Phone Number: 09/09/2019, 1:46 PM   Clinical Narrative:    Case manager spoke with patient's daughter, Caitlyn to arrange for Home Health and oxygen. Patient will be going to stay at an AirBnB at  [address]. Referral for Home Health Agency offered, they have no preference. Case manager called referral to Lorenza Chick, [facility] Liaison, called Durward Fortes with [facility] for oxygen. Luther Parody will meet [employer] at the house to get concentrator setup and will pickup her dad after. Discount card for Elquis printed to the unit, Caitlyn has been asked to get them when she picks up her dad. Prescriptions were called to [facility] pharmacy as she requested by MD.    Final next level of care: Home w Home Health Services Barriers to Discharge: No Barriers Identified   Patient Goals and CMS Choice Patient states their goals for this hospitalization and ongoing recovery are:: to go home   Choice offered to / list presented to : Adult Children  Discharge Placement                       Discharge Plan and Services   Discharge Planning Services: CM Consult, Other - See comment(Eliquis discount cards) Post Acute Care Choice: Durable Medical Equipment, Home Health          DME Arranged: Oxygen DME Agency: [facility] Date DME Agency Contacted: 09/09/19 Time DME Agency Contacted: 1315 Representative spoke with at DME Agency: Durward Fortes HH Arranged: RN, PT, OT, Nurse's Aide HH Agency: [facility] Date [facility] Agency Contacted: 09/09/19 Time HH Agency Contacted: 1315 Representative spoke with at [facility] Agency: Lorenza Chick  Social Determinants of Health (SDOH) Interventions     Readmission Risk Interventions No flowsheet data found.

## 2019-09-09 NOTE — Discharge Summary (Signed)
Roy Lee, is a 64 y.o. male  DOB 02-06-1956  MRN [id number].  Admission date:  09/03/2019  Admitting Physician  Artist Beach, MD  Discharge Date:  09/09/2019   Primary MD  Scifres, Earlie Server, PA-C  Recommendations for primary care physician for things to follow:  -Please check CBC, CMP during next visit -Hemoglobin A1c 6.3, please counsel about carb modified diet,and weight loss. -Blood pressure medicine has been stopped given stable blood pressure during hospital stay, follow-up closely.   Admission Diagnosis  Acute respiratory failure with hypoxia (HCC) [J96.01] Pneumonia due to COVID-19 virus [U07.1, J12.82] COVID-19 [U07.1]   Discharge Diagnosis  Acute respiratory failure with hypoxia ([place]) [J96.01] Pneumonia due to COVID-19 virus [U07.1, J12.82] COVID-19 [U07.1]    Active Problems:   Pneumonia due to COVID-19 virus      Past Medical History:  Diagnosis Date  . Arthritis   . Hypertension   . Obesity     History reviewed. No pertinent surgical history.     History of present illness and  Hospital Course:     Kindly see H&P for history of present illness and admission details, please review complete Labs, Consult reports and Test reports for all details in brief  HPI  from the history and physical done on the day of admission 09/03/2019  HPI: Roy Lee is a 64 y.o. male with medical history significant of essential hypertension, chronic arthritis and obesity.  Patient presents to the emergency room 4 days after testing positive for Covid with worsening shortness of breath.  Shortness of breath is still significant to the extent he is unable to ambulate without having to take breaks to catch his breath.  Symptoms associated with cough which is nonproductive.  He was noted to be hypoxic with oxygen saturations in the 70's on presentation.  This improved with oxygen  supplementation with 2 L/min of oxygen.  Denies any headache, chest pain, palpitations, nausea or vomiting.   ED Course: Patient was noted to be hypoxic on presentation has requiring oxygen supplementation with nasal cannula.  Currently remained stable on 3 L/min of oxygen.  Patient was initiated on Decadron and IV fluids.  Hospitalist consulted for evaluation and further management.  Hospital Course   Acute Hypoxic Resp. Failure due to Acute Covid 19 Viral Pneumonitis during the ongoing 2020 Covid 19 Pandemic  - he has severe disease on presentation, requiring 15 L high flow, significantly improved, he is currently on 2 L at rest, requiring up to 3 L with activity .  He was treated with IV remdesivir x5 days, as well treated with IV steroids, he will be discharged on p.o. Decadron for another 5 days, he did receive Actemra on admission as well. Was encouraged to take incentive spirometry and flutter valve at home and keep using them as well .   SpO2: 94 % O2 Flow Rate (L/min): 2 L/min  Last Labs            Recent Labs  Lab 09/03/19 1848 09/03/19  1848 09/04/19 0725 09/05/19 0350 09/06/19 0420 09/07/19 0222 09/08/19 0213  CRP 19.6*   < > 20.0* 14.8* 7.6* 4.8* 2.4*  DDIMER 3.31*   < > 6.78* 7.02* 3.84* 3.07* 3.05*  FERRITIN 969*  --   --   --   --   --   --   BNP  --   --  60.7 48.0 70.9 84.5 64.9  PROCALCITON <0.10   < > <0.10 <0.10 0.19 0.11 <0.10   < > = values in this interval not displayed.      Hepatic Function Latest Ref Rng & Units 09/08/2019 09/07/2019 09/06/2019  Total Protein 6.5 - 8.1 g/dL 6.2(L) 7.1 6.6  Albumin 3.5 - 5.0 g/dL 2.9(L) 3.1(L) 2.8(L)  AST 15 - 41 U/L '24 25 23  ' ALT 0 - 44 U/L 46(H) 50(H) 53(H)  Alk Phosphatase 38 - 126 U/L 53 54 41  Total Bilirubin 0.3 - 1.2 mg/dL 0.7 0.8 0.7   OSA -She reports he has history of OSA, was encouraged to comply with his CPAP   Bilateral PEs. -CTA chest is significant for bilateral PE, with no right heart strain,  he is on full dose Lovenox, currently on Eliquis  Essential hypertension.   -Patient was noted to be on multiple blood pressure medications at home, blood pressure was soft initially, indeed it did mean stable during hospital stay, so his blood pressure medicine has been held on discharge .  Obesity.  BMI of 38.  Follow with PCP for weight loss.   Hyperglycemia -  secondary to COVID-19 and steroids , A1c is 6.3, diagnostic of prediabetes, patient was kept on sliding scale during hospital stay, required low-dose Levemir, have discussed with patient about carb modified diet at home.    Discharge Condition:  Stable   Follow UP  Follow-up Information    Scifres, Earlie Server, PA-C Follow up in 2 week(s).   Specialty: Physician Assistant Contact information: [place] [address] [phone]             Discharge Instructions  and  Discharge Medications    Discharge Instructions    Discharge instructions   Complete by: As directed    Follow with Primary MD Scifres, Earlie Server, PA-C in 14 days   Get CBC, CMP, checked  by Primary MD next visit.    Activity: As tolerated with Full fall precautions use walker/cane & assistance as needed   Disposition Home    Diet: Heart Healthy/CArb modified  For Heart failure patients - Check your Weight same time everyday, if you gain over 2 pounds, or you develop in leg swelling, experience more shortness of breath or chest pain, call your Primary MD immediately. Follow Cardiac Low Salt Diet and 1.5 lit/day fluid restriction.   On your next visit with your primary care physician please Get Medicines reviewed and adjusted.   Please request your Prim.MD to go over all Hospital Tests and Procedure/Radiological results at the follow up, please get all Hospital records sent to your Prim MD by signing hospital release before you go home.   If you experience worsening of your admission symptoms, develop shortness  of breath, life threatening emergency, suicidal or homicidal thoughts you must seek medical attention immediately by calling 911 or calling your MD immediately  if symptoms less severe.  You Must read complete instructions/literature along with all the possible adverse reactions/side effects for all the Medicines you take and that have been prescribed to you. Take any  new Medicines after you have completely understood and accpet all the possible adverse reactions/side effects.   Do not drive, operating heavy machinery, perform activities at heights, swimming or participation in water activities or provide baby sitting services if your were admitted for syncope or siezures until you have seen by Primary MD or a Neurologist and advised to do so again.  Do not drive when taking Pain medications.    Do not take more than prescribed Pain, Sleep and Anxiety Medications  Special Instructions: If you have smoked or chewed Tobacco  in the last 2 yrs please stop smoking, stop any regular Alcohol  and or any Recreational drug use.  Wear Seat belts while driving.   Please note  You were cared for by a hospitalist during your hospital stay. If you have any questions about your discharge medications or the care you received while you were in the hospital after you are discharged, you can call the unit and asked to speak with the hospitalist on call if the hospitalist that took care of you is not available. Once you are discharged, your primary care physician will handle any further medical issues. Please note that NO REFILLS for any discharge medications will be authorized once you are discharged, as it is imperative that you return to your primary care physician (or establish a relationship with a primary care physician if you do not have one) for your aftercare needs so that they can reassess your need for medications and monitor your lab values.   Increase activity slowly   Complete by: As directed       Allergies as of 09/09/2019   No Known Allergies     Medication List    STOP taking these medications   amLODipine 5 MG tablet Commonly known as: NORVASC   aspirin 81 MG tablet   furosemide 20 MG tablet Commonly known as: LASIX   hydrochlorothiazide 12.5 MG capsule Commonly known as: MICROZIDE   lisinopril 40 MG tablet Commonly known as: ZESTRIL     TAKE these medications   acetaminophen 325 MG tablet Commonly known as: TYLENOL Take 2 tablets (650 mg total) by mouth every 6 (six) hours as needed for mild pain or headache (fever >/= 101).   apixaban 5 MG Tabs tablet Commonly known as: ELIQUIS Please take 10 mg oral twice daily for next 5 days, then transition to 5 mg oral twice daily on 09/14/2019   dexamethasone 6 MG tablet Commonly known as: DECADRON Take 1 tablet (6 mg total) by mouth daily. Start taking on: September 10, 2019   Fish Oil 1000 MG Caps Take 1,000 mg by mouth daily.   latanoprost 0.005 % ophthalmic solution Commonly known as: XALATAN Place 1 drop into both eyes daily.   pantoprazole 40 MG tablet Commonly known as: Protonix Take 1 tablet (40 mg total) by mouth daily.   vitamin C 500 MG tablet Commonly known as: ASCORBIC ACID Take 500 mg by mouth daily.            Durable Medical Equipment  (From admission, onward)         Start     Ordered   09/09/19 0942  For home use only DME oxygen  Once    Question Answer Comment  Length of Need 6 Months   Mode or (Route) Nasal cannula   Liters per Minute 2   Frequency Continuous (stationary and portable oxygen unit needed)   Oxygen conserving device Yes   Oxygen delivery system  Gas      09/09/19 0941   09/08/19 1533  DME Oxygen  (Discharge Planning)  Once    Question Answer Comment  Length of Need 6 Months   Mode or (Route) Nasal cannula   Liters per Minute 2   Frequency Continuous (stationary and portable oxygen unit needed)   Oxygen conserving device Yes   Oxygen delivery system Gas       09/08/19 1532            Diet and Activity recommendation: See Discharge Instructions above   Consults obtained -   None  Major procedures and Radiology Reports - PLEASE review detailed and final reports for all details, in brief -      CT ANGIO CHEST PE W OR WO CONTRAST  Result Date: 09/04/2019 CLINICAL DATA:  COVID positive.  Shortness of breath EXAM: CT ANGIOGRAPHY CHEST WITH CONTRAST TECHNIQUE: Multidetector CT imaging of the chest was performed using the standard protocol during bolus administration of intravenous contrast. Multiplanar CT image reconstructions and MIPs were obtained to evaluate the vascular anatomy. CONTRAST:  41m OMNIPAQUE IOHEXOL 350 MG/ML SOLN COMPARISON:  09/03/2019 chest x-ray FINDINGS: Cardiovascular: Filling defects are seen in the lower lobe pulmonary arteries bilaterally compatible with segmental and subsegmental pulmonary emboli. No evidence of right heart strain. Heart is borderline in size. Aorta is normal caliber. Mediastinum/Nodes: Small hiatal hernia. Small scattered reactive mediastinal lymph nodes. No mediastinal, hilar, or axillary adenopathy. Thyroid unremarkable. Lungs/Pleura: Extensive bilateral ground-glass airspace opacities within the lungs. No effusions. Upper Abdomen: Imaging into the upper abdomen shows no acute findings. Musculoskeletal: Chest wall soft tissues are unremarkable. No acute bony abnormality. Review of the MIP images confirms the above findings. IMPRESSION: Small bilateral segmental and subsegmental pulmonary emboli in the lower lobes. No evidence of right heart strain. Extensive bilateral ground-glass airspace opacities compatible with COVID pneumonia. Small hiatal hernia. Electronically Signed   By: KRolm BaptiseM.D.   On: 09/04/2019 00:53   DG Chest Port 1 View  Result Date: 09/03/2019 CLINICAL DATA:  COVID-19 pneumonia EXAM: PORTABLE CHEST 1 VIEW COMPARISON:  05/25/2011 FINDINGS: There are diffuse bilateral hazy  airspace opacities consistent with the patient's history of COVID-19 pneumonia. The lung volumes are low. The heart size is normal. There may be trace to small bilateral pleural effusions. There is no acute osseous abnormality. IMPRESSION: Hazy bilateral airspace opacities consistent with the patient's history of viral pneumonia. Electronically Signed   By: CConstance HolsterM.D.   On: 09/03/2019 19:24    Micro Results     Recent Results (from the past 240 hour(s))  Blood Culture (routine x 2)     Status: None   Collection Time: 09/03/19  6:48 PM   Specimen: BLOOD  Result Value Ref Range Status   Specimen Description BLOOD RIGHT ANTECUBITAL  Final   Special Requests   Final    BOTTLES DRAWN AEROBIC AND ANAEROBIC Blood Culture results may not be optimal due to an excessive volume of blood received in culture bottles Performed at [facility] [address] [place] [address]   Culture   Final    NO GROWTH 5 DAYS Performed at [facility] [address] [place] [address]   Report Status 09/08/2019 FINAL  Final  Blood Culture (routine x 2)     Status: None   Collection Time: 09/03/19  6:48 PM   Specimen: BLOOD RIGHT FOREARM  Result Value Ref Range Status  Specimen Description BLOOD RIGHT FOREARM  Final   Special Requests   Final    BOTTLES DRAWN AEROBIC AND ANAEROBIC Blood Culture adequate volume Performed at [place] [address], [place], [place] [address]    Culture   Final    NO GROWTH 5 DAYS Performed at [facility], [place] [address], [place], [place] [address]    Report Status 09/08/2019 FINAL  Final       Today   Subjective:   Roy Lee today has no headache,no chest or abdominal pain, no fever, no chills, patient reports some  dyspnea with activity .  Objective:   Blood pressure 122/82, pulse 73, temperature 98.1 F (36.7 C), temperature source Oral, resp. rate 20, height 5'  11" (1.803 m), weight 124.5 kg, SpO2 99 %.   Intake/Output Summary (Last 24 hours) at 09/09/2019 1137 Last data filed at 09/09/2019 1000 Gross per 24 hour  Intake 720 ml  Output --  Net 720 ml    Exam Awake Alert, Oriented x 3, No new F.N deficits, Normal affect  Symmetrical Chest wall movement, Good air movement bilaterally, CTAB RRR,No Gallops,Rubs or new Murmurs, No Parasternal Heave +ve B.Sounds, Abd Soft, Non tender, No rebound -guarding or rigidity. No Cyanosis, Clubbing or edema, No new Rash or bruise  Data Review   CBC w Diff:  Lab Results  Component Value Date   WBC 15.2 (H) 09/08/2019   HGB 14.0 09/08/2019   HCT 41.4 09/08/2019   PLT 367 09/08/2019   LYMPHOPCT 13 09/08/2019   MONOPCT 3 09/08/2019   EOSPCT 0 09/08/2019   BASOPCT 0 09/08/2019    CMP:  Lab Results  Component Value Date   NA 134 (L) 09/08/2019   K 4.5 09/08/2019   CL 103 09/08/2019   CO2 23 09/08/2019   BUN 32 (H) 09/08/2019   CREATININE 0.83 09/08/2019   PROT 6.2 (L) 09/08/2019   ALBUMIN 2.9 (L) 09/08/2019   BILITOT 0.7 09/08/2019   ALKPHOS 53 09/08/2019   AST 24 09/08/2019   ALT 46 (H) 09/08/2019  .   Total Time in preparing paper work, data evaluation and todays exam - 48 minutes  Phillips Climes M.D on 09/09/2019 at 11:37 AM  [facility]   Office  [phone]

## 2019-09-09 NOTE — Progress Notes (Signed)
Patient refused CPAP for the night stating that he will continue his O2 @ 2L for the night. Patient was educated about the importance of CPAP at night and he verbalized understanding.

## 2019-09-09 NOTE — TOC Transition Note (Signed)
Transition of Care [facility]) - CM/SW Discharge Note   Patient Details  Name: Roy Lee MRN: [id number] Date of Birth: 1955/10/02  Transition of Care [facility]) CM/SW Contact:  Durenda Guthrie, RN Phone Number: [phone] (working remotely) 09/09/2019, 1:22 PM   Clinical Narrative: Case manager spoke with patient's daughter, Roy Lee to arrange for Home Health and oxygen. Patient will be going to stay at an AirBnB at  [address]. Referral for Home Health Agency offered, they have no preference. Case manager called referral to Lorenza Chick, [facility], called Durward Fortes with [facility] for oxygen. Luther Parody will meet [employer] at the house to get concentrator setup and will pickup her dad after. Discount card for Elquis printed to the unit, Roy Lee has been asked to get them when she picks up her dad. Prescriptions were called to [facility] pharmacy as she requested by MD.      Barriers to Discharge: Continued Medical Work up   Patient Goals and CMS Choice Patient states their goals for this hospitalization and ongoing recovery are:: to go home      Discharge Placement                       Discharge Plan and Services                                     Social Determinants of Health (SDOH) Interventions     Readmission Risk Interventions No flowsheet data found.

## 2019-09-09 NOTE — Progress Notes (Signed)
SATURATION QUALIFICATIONS: (This note is used to comply with regulatory documentation for home oxygen)  Patient Saturations on Room Air at Rest = 96%  Patient Saturations on Room Air while Ambulating = unable to ambulate on room air  Patient Saturations on 4 Liters of oxygen while Ambulating = 83%  Please briefly explain why patient needs home oxygen: Pt desaturates on room air with mobility and requires supplemental oxygen to maintain a safe saturation level.  Drema Pry, PT

## 2019-09-11 MED FILL — ELIQUIS 5 MG TABLET: 5 | 30 days supply | Qty: 70 | Fill #0

## 2019-09-16 ENCOUNTER — Emergency Department (HOSPITAL_COMMUNITY): Payer: 59

## 2019-09-16 ENCOUNTER — Other Ambulatory Visit: Payer: Self-pay

## 2019-09-16 ENCOUNTER — Emergency Department (HOSPITAL_COMMUNITY)
Admission: EM | Admit: 2019-09-16 | Discharge: 2019-09-16 | Disposition: A | Discharge status: Home / Self Care | Payer: 59 | Attending: Emergency Medicine | Admitting: Emergency Medicine

## 2019-09-16 ENCOUNTER — Encounter (HOSPITAL_COMMUNITY): Payer: Self-pay

## 2019-09-16 DIAGNOSIS — I10 Essential (primary) hypertension: Secondary | ICD-10-CM | POA: Diagnosis not present

## 2019-09-16 DIAGNOSIS — R0600 Dyspnea, unspecified: Secondary | ICD-10-CM | POA: Diagnosis not present

## 2019-09-16 DIAGNOSIS — R0602 Shortness of breath: Secondary | ICD-10-CM | POA: Diagnosis present

## 2019-09-16 DIAGNOSIS — Z8616 Personal history of COVID-19: Secondary | ICD-10-CM | POA: Diagnosis not present

## 2019-09-16 LAB — CBC WITH DIFFERENTIAL/PLATELET
Abs Immature Granulocytes: 0.11 10*3/uL — ABNORMAL HIGH (ref 0.00–0.07)
Basophils Absolute: 0 10*3/uL (ref 0.0–0.1)
Basophils Relative: 0 %
Eosinophils Absolute: 0.1 10*3/uL (ref 0.0–0.5)
Eosinophils Relative: 1 %
HCT: 43.7 % (ref 39.0–52.0)
Hemoglobin: 14.8 g/dL (ref 13.0–17.0)
Immature Granulocytes: 1 %
Lymphocytes Relative: 11 %
Lymphs Abs: 1.1 10*3/uL (ref 0.7–4.0)
MCH: 30 pg (ref 26.0–34.0)
MCHC: 33.9 g/dL (ref 30.0–36.0)
MCV: 88.5 fL (ref 80.0–100.0)
Monocytes Absolute: 0.5 10*3/uL (ref 0.1–1.0)
Monocytes Relative: 5 %
Neutro Abs: 8 10*3/uL — ABNORMAL HIGH (ref 1.7–7.7)
Neutrophils Relative %: 82 %
Platelets: 193 10*3/uL (ref 150–400)
RBC: 4.94 MIL/uL (ref 4.22–5.81)
RDW: 14.9 % (ref 11.5–15.5)
WBC: 9.8 10*3/uL (ref 4.0–10.5)
nRBC: 0 % (ref 0.0–0.2)

## 2019-09-16 LAB — COMPREHENSIVE METABOLIC PANEL
ALT: 36 U/L (ref 0–44)
AST: 23 U/L (ref 15–41)
Albumin: 3.2 g/dL — ABNORMAL LOW (ref 3.5–5.0)
Alkaline Phosphatase: 49 U/L (ref 38–126)
Anion gap: 8 (ref 5–15)
BUN: 16 mg/dL (ref 8–23)
CO2: 25 mmol/L (ref 22–32)
Calcium: 8.3 mg/dL — ABNORMAL LOW (ref 8.9–10.3)
Chloride: 106 mmol/L (ref 98–111)
Creatinine, Ser: 0.96 mg/dL (ref 0.61–1.24)
GFR calc Af Amer: >60 mL/min (ref >60)
GFR calc non Af Amer: >60 mL/min (ref >60)
Glucose, Bld: 97 mg/dL (ref 70–99)
Potassium: 4.1 mmol/L (ref 3.5–5.1)
Sodium: 139 mmol/L (ref 135–145)
Total Bilirubin: 0.8 mg/dL (ref 0.3–1.2)
Total Protein: 6 g/dL — ABNORMAL LOW (ref 6.5–8.1)

## 2019-09-16 LAB — TROPONIN I (HIGH SENSITIVITY): Troponin I (High Sensitivity): 5 ng/L (ref <18)

## 2019-09-16 LAB — TYPE AND SCREEN
ABO/RH(D): A POS
Antibody Screen: NEGATIVE

## 2019-09-16 LAB — BRAIN NATRIURETIC PEPTIDE: B Natriuretic Peptide: 49.6 pg/mL (ref 0.0–100.0)

## 2019-09-16 LAB — PROTIME-INR
INR: 1.1 (ref 0.8–1.2)
Prothrombin Time: 13.7 seconds (ref 11.4–15.2)

## 2019-09-16 MED ORDER — SODIUM CHLORIDE (PF) 0.9 % IJ SOLN
INTRAMUSCULAR | Status: AC
  Filled 2019-09-16: qty 50

## 2019-09-16 MED ORDER — FUROSEMIDE 20 MG PO TABS: 20.0000 mg | ORAL_TABLET | Freq: Every day | ORAL | 1 refills | Status: AC

## 2019-09-16 MED ORDER — IOHEXOL 350 MG/ML SOLN
100.0000 mL | Freq: Once | INTRAVENOUS | Status: AC | PRN
  Administered 2019-09-16: 100 mL via INTRAVENOUS

## 2019-09-16 MED FILL — FUROSEMIDE 20 MG TABS: 20 | 30 days supply | Qty: 30 | Fill #0

## 2019-09-16 NOTE — ED Provider Notes (Signed)
[facility] DEPT Provider Note   CSN: [id number] Arrival date & time: 09/16/19  1126     History No chief complaint on file.   Roy Lee is a 64 y.o. male.  64 year old male with prior medical history as detailed below presents for evaluation of shortness of breath.  Patient reports recent admission from 1/24 to 1/30 for Covid pneumonia.  Patient was also diagnosed with bilateral PE's at this time.  He was treated with both steroids and remdesivir.  He has been discharged for the last 7 days.  He went home with 3 L nasal cannula which he uses with exertion.  Patient reports increased shortness of breath over the last 24 hours.  He denies fever or chills.  He denies cough.  He reports compliance with his Eliquis.  He denies associated chest pain.  He denies significant lower extremity edema.  Of note, patient reports that he was on diuretics (Lasix 20 mg daily) prior to his admission was discharged with instructions do not start taking his Lasix again.  The history is provided by the patient and medical records.  Shortness of Breath Severity:  Mild Onset quality:  Gradual Duration:  1 day Timing:  Constant Progression:  Waxing and waning Chronicity:  New Relieved by:  Nothing Worsened by:  Nothing Ineffective treatments:  None tried      Past Medical History:  Diagnosis Date  . Arthritis   . Hypertension   . Obesity     Patient Active Problem List   Diagnosis Date Noted  . Pneumonia due to COVID-19 virus 09/03/2019    No past surgical history on file.     No family history on file.  Social History   Tobacco Use  . Smoking status: Never Smoker  . Smokeless tobacco: Never Used  Substance Use Topics  . Alcohol use: Yes    Alcohol/week: 0.0 standard drinks    Comment: very little  . Drug use: No    Home Medications Prior to Admission medications   Medication Sig Start Date End Date Taking? Authorizing Provider   acetaminophen (TYLENOL) 325 MG tablet Take 2 tablets (650 mg total) by mouth every 6 (six) hours as needed for mild pain or headache (fever >/= 101). 09/09/19   Elgergawy, Silver Huguenin, MD  apixaban (ELIQUIS) 5 MG TABS tablet Please take 10 mg oral twice daily for next 5 days, then transition to 5 mg oral twice daily on 09/14/2019 09/09/19   Elgergawy, Silver Huguenin, MD  dexamethasone (DECADRON) 4 MG tablet Take 2 tablets (8 mg total) by mouth daily. 09/10/19   Elgergawy, Silver Huguenin, MD  latanoprost (XALATAN) 0.005 % ophthalmic solution Place 1 drop into both eyes daily. 07/31/19   [provider]  Omega-3 Fatty Acids (FISH OIL) 1000 MG CAPS Take 1,000 mg by mouth daily.     [provider]  pantoprazole (PROTONIX) 40 MG tablet Take 1 tablet (40 mg total) by mouth daily. 09/09/19 10/09/19  Elgergawy, Silver Huguenin, MD  vitamin C (ASCORBIC ACID) 500 MG tablet Take 500 mg by mouth daily.    [provider]    Allergies    Patient has no known allergies.  Review of Systems   Review of Systems  Respiratory: Positive for shortness of breath.   All other systems reviewed and are negative.   Physical Exam Updated Vital Signs BP 129/90 (BP Location: Left Arm)   Pulse 82   Temp 97.6 F (36.4 C) (Oral)  Resp 19   Ht 5\' 11"  (1.803 m)   Wt 117.9 kg   SpO2 96%   BMI 36.26 kg/m   Physical Exam Vitals and nursing note reviewed.  Constitutional:      General: He is not in acute distress.    Appearance: Normal appearance. He is well-developed.  HENT:     Head: Normocephalic and atraumatic.  Eyes:     Conjunctiva/sclera: Conjunctivae normal.     Pupils: Pupils are equal, round, and reactive to light.  Cardiovascular:     Rate and Rhythm: Normal rate and regular rhythm.     Heart sounds: Normal heart sounds.  Pulmonary:     Effort: Pulmonary effort is normal. No respiratory distress.     Breath sounds: Normal breath sounds.  Abdominal:     General: There is no distension.      Palpations: Abdomen is soft.     Tenderness: There is no abdominal tenderness.  Musculoskeletal:        General: No deformity. Normal range of motion.     Cervical back: Normal range of motion and neck supple.  Skin:    General: Skin is warm and dry.  Neurological:     General: No focal deficit present.     Mental Status: He is alert and oriented to person, place, and time. Mental status is at baseline.     ED Results / Procedures / Treatments   Labs (all labs ordered are listed, but only abnormal results are displayed) Labs Reviewed  COMPREHENSIVE METABOLIC PANEL - Abnormal; Notable for the following components:      Result Value   Calcium 8.3 (*)    Total Protein 6.0 (*)    Albumin 3.2 (*)    All other components within normal limits  CBC WITH DIFFERENTIAL/PLATELET - Abnormal; Notable for the following components:   Neutro Abs 8.0 (*)    Abs Immature Granulocytes 0.11 (*)    All other components within normal limits  CULTURE, BLOOD (ROUTINE X 2)  CULTURE, BLOOD (ROUTINE X 2)  BRAIN NATRIURETIC PEPTIDE  PROTIME-INR  TYPE AND SCREEN  TROPONIN I (HIGH SENSITIVITY)  TROPONIN I (HIGH SENSITIVITY)    EKG EKG Interpretation  Date/Time:  Saturday September 16 2019 12:31:38 EST Ventricular Rate:  65 PR Interval:    QRS Duration: 95 QT Interval:  435 QTC Calculation: 453 R Axis:   -10 Text Interpretation: Sinus rhythm Confirmed by 12-12-1986 [phone]) on 09/16/2019 1:00:55 PM   Radiology CT Angio Chest PE W and/or Wo Contrast  Result Date: 09/16/2019 CLINICAL DATA:  The patient tested positive for COVID-19 08/31/2019. Worsening shortness of breath. EXAM: CT ANGIOGRAPHY CHEST WITH CONTRAST TECHNIQUE: Multidetector CT imaging of the chest was performed using the standard protocol during bolus administration of intravenous contrast. Multiplanar CT image reconstructions and MIPs were obtained to evaluate the vascular anatomy. CONTRAST:  100 mL OMNIPAQUE IOHEXOL 350 MG/ML SOLN  COMPARISON:  CT chest 09/04/2019. Single-view of the chest earlier today, 09/03/2019 and PA and lateral chest 05/25/2011. FINDINGS: Cardiovascular: Satisfactory opacification of the pulmonary arteries to the segmental level. No evidence of pulmonary embolism. Mild cardiomegaly. No pericardial effusion. Mild aortic atherosclerosis. Mediastinum/Nodes: No enlarged mediastinal, hilar, or axillary lymph nodes. Thyroid gland, trachea, and esophagus demonstrate no significant findings. Lungs/Pleura: No pleural effusion. Multifocal airspace disease persist. There are new linear bands of opacities in the lungs bilaterally. The Upper Abdomen: Small hiatal hernia noted. Musculoskeletal: No acute or focal abnormality. Review of the MIP images confirms  the above findings. IMPRESSION: Negative for pulmonary embolus. Persistent extensive bilateral airspace disease demonstrates findings compatible with the presence of fibrous bands and more focal areas of consolidation consistent with evolution of COVID-19 pneumonia. Aortic Atherosclerosis (ICD10-I70.0). Electronically Signed   By: Drusilla Kanner M.D.   On: 09/16/2019 14:38   DG Chest Port 1 View  Result Date: 09/16/2019 CLINICAL DATA:  64 year old male with COVID-19 and shortness of breath. She was admitted to [facility] from 01/24 through 01/30 of 2021. EXAM: PORTABLE CHEST 1 VIEW COMPARISON:  Prior chest x-ray 09/03/2019 FINDINGS: Similar appearance of the lungs. Persistent low inspiratory volumes with patchy areas of predominantly interstitial opacities in both lower lobes. Stable cardiac and mediastinal contours. Background bronchitic changes remain stable. No pleural effusion or pneumothorax. No acute osseous abnormality. IMPRESSION: Similar appearance of the lungs compared to 09/03/2018 with persistent bilateral peripheral and lower lobe predominant interstitial airspace opacities. Findings may represent persistent multifocal COVID pneumonia versus areas of  residual pleuroparenchymal scarring. No new cardiopulmonary process identified. Electronically Signed   By: Malachy Moan M.D.   On: 09/16/2019 13:01    Procedures Procedures (including critical care time)  Medications Ordered in ED Medications - No data to display  ED Course  I have reviewed the triage vital signs and the nursing notes.  Pertinent labs & imaging results that were available during my care of the patient were reviewed by me and considered in my medical decision making (see chart for details).    MDM Rules/Calculators/A&P                      MDM  Screen complete  JANICE SEALES was evaluated in Emergency Department on 09/16/2019 for the symptoms described in the history of present illness. He was evaluated in the context of the global COVID-19 pandemic, which necessitated consideration that the patient might be at risk for infection with the SARS-CoV-2 virus that causes COVID-19. Institutional protocols and algorithms that pertain to the evaluation of patients at risk for COVID-19 are in a state of rapid change based on information released by regulatory bodies including the CDC and federal and state organizations. These policies and algorithms were followed during the patient's care in the ED.  Patient is presenting for evaluation.  He is reporting persistent shortness of breath following his recent admission for Covid.  Screening labs obtained in the ED are without significant abnormality.  CTA does not demonstrate new persistent PE.  No evidence of new infection or infiltrate.  Patient's Lasix was held the time of his admission.  He has not yet restarted it.  Suspect that a portion of his symptoms may be secondary to mild fluid retention.  Patient is advised to restart his daily Lasix dosing.  Importance of close follow-up stressed.  Strict return precautions given and understood.  Final Clinical Impression(s) / ED Diagnoses Final diagnoses:  Dyspnea,  unspecified type    Rx / DC Orders ED Discharge Orders    None       Wynetta Fines, MD 09/16/19 1521

## 2019-09-16 NOTE — ED Triage Notes (Signed)
Patient presented to ed with c/o increase in shortness of breath. Patient was on home oxygen 2-4 liters [name].Patient was tested positive for Covid19  on 08/31/19. Patient was admit to [facility] on 1/24 and discharged 09/09/19.

## 2019-09-16 NOTE — Discharge Instructions (Addendum)
Please return for any problem.  Follow-up with your regular care provider as instructed.  Please resume your previously prescribed Lasix dosing.  If you develop fever, increasing shortness of breath, or other emergency please return to the ED for evaluation.

## 2019-09-21 LAB — CULTURE, BLOOD (ROUTINE X 2)
Culture: NO GROWTH
Culture: NO GROWTH

## 2019-09-22 ENCOUNTER — Encounter (HOSPITAL_COMMUNITY): Payer: Self-pay

## 2019-09-22 ENCOUNTER — Other Ambulatory Visit: Payer: Self-pay

## 2019-09-22 ENCOUNTER — Inpatient Hospital Stay (HOSPITAL_COMMUNITY)
Admission: EM | Admit: 2019-09-22 | Discharge: 2019-11-09 | DRG: 207 | Disposition: E | Payer: 59 | Attending: Pulmonary Disease | Admitting: Pulmonary Disease

## 2019-09-22 ENCOUNTER — Emergency Department (HOSPITAL_COMMUNITY): Payer: 59

## 2019-09-22 DIAGNOSIS — R34 Anuria and oliguria: Secondary | ICD-10-CM | POA: Diagnosis not present

## 2019-09-22 DIAGNOSIS — I480 Paroxysmal atrial fibrillation: Secondary | ICD-10-CM | POA: Diagnosis not present

## 2019-09-22 DIAGNOSIS — I2699 Other pulmonary embolism without acute cor pulmonale: Secondary | ICD-10-CM

## 2019-09-22 DIAGNOSIS — Z6841 Body Mass Index (BMI) 40.0 and over, adult: Secondary | ICD-10-CM

## 2019-09-22 DIAGNOSIS — J1282 Pneumonia due to coronavirus disease 2019: Secondary | ICD-10-CM | POA: Diagnosis present

## 2019-09-22 DIAGNOSIS — J189 Pneumonia, unspecified organism: Secondary | ICD-10-CM

## 2019-09-22 DIAGNOSIS — A4189 Other specified sepsis: Secondary | ICD-10-CM | POA: Diagnosis not present

## 2019-09-22 DIAGNOSIS — Z452 Encounter for adjustment and management of vascular access device: Secondary | ICD-10-CM

## 2019-09-22 DIAGNOSIS — I10 Essential (primary) hypertension: Secondary | ICD-10-CM | POA: Diagnosis not present

## 2019-09-22 DIAGNOSIS — Z9981 Dependence on supplemental oxygen: Secondary | ICD-10-CM | POA: Diagnosis not present

## 2019-09-22 DIAGNOSIS — R0603 Acute respiratory distress: Secondary | ICD-10-CM | POA: Diagnosis not present

## 2019-09-22 DIAGNOSIS — R578 Other shock: Secondary | ICD-10-CM | POA: Diagnosis not present

## 2019-09-22 DIAGNOSIS — R739 Hyperglycemia, unspecified: Secondary | ICD-10-CM | POA: Diagnosis not present

## 2019-09-22 DIAGNOSIS — E872 Acidosis: Secondary | ICD-10-CM | POA: Diagnosis not present

## 2019-09-22 DIAGNOSIS — D696 Thrombocytopenia, unspecified: Secondary | ICD-10-CM | POA: Diagnosis not present

## 2019-09-22 DIAGNOSIS — Z66 Do not resuscitate: Secondary | ICD-10-CM | POA: Diagnosis not present

## 2019-09-22 DIAGNOSIS — K7201 Acute and subacute hepatic failure with coma: Secondary | ICD-10-CM | POA: Diagnosis not present

## 2019-09-22 DIAGNOSIS — E870 Hyperosmolality and hypernatremia: Secondary | ICD-10-CM | POA: Diagnosis not present

## 2019-09-22 DIAGNOSIS — I50811 Acute right heart failure: Secondary | ICD-10-CM | POA: Diagnosis not present

## 2019-09-22 DIAGNOSIS — Y95 Nosocomial condition: Secondary | ICD-10-CM | POA: Diagnosis not present

## 2019-09-22 DIAGNOSIS — R0602 Shortness of breath: Secondary | ICD-10-CM

## 2019-09-22 DIAGNOSIS — Z01818 Encounter for other preprocedural examination: Secondary | ICD-10-CM

## 2019-09-22 DIAGNOSIS — A419 Sepsis, unspecified organism: Secondary | ICD-10-CM | POA: Diagnosis not present

## 2019-09-22 DIAGNOSIS — T68XXXA Hypothermia, initial encounter: Secondary | ICD-10-CM

## 2019-09-22 DIAGNOSIS — Z978 Presence of other specified devices: Secondary | ICD-10-CM | POA: Diagnosis not present

## 2019-09-22 DIAGNOSIS — J9601 Acute respiratory failure with hypoxia: Secondary | ICD-10-CM | POA: Diagnosis not present

## 2019-09-22 DIAGNOSIS — E875 Hyperkalemia: Secondary | ICD-10-CM | POA: Diagnosis not present

## 2019-09-22 DIAGNOSIS — D6489 Other specified anemias: Secondary | ICD-10-CM | POA: Diagnosis not present

## 2019-09-22 DIAGNOSIS — Z9911 Dependence on respirator [ventilator] status: Secondary | ICD-10-CM | POA: Diagnosis not present

## 2019-09-22 DIAGNOSIS — G9341 Metabolic encephalopathy: Secondary | ICD-10-CM | POA: Diagnosis not present

## 2019-09-22 DIAGNOSIS — K219 Gastro-esophageal reflux disease without esophagitis: Secondary | ICD-10-CM | POA: Diagnosis present

## 2019-09-22 DIAGNOSIS — K649 Unspecified hemorrhoids: Secondary | ICD-10-CM | POA: Diagnosis present

## 2019-09-22 DIAGNOSIS — Z79899 Other long term (current) drug therapy: Secondary | ICD-10-CM

## 2019-09-22 DIAGNOSIS — R001 Bradycardia, unspecified: Secondary | ICD-10-CM

## 2019-09-22 DIAGNOSIS — R0902 Hypoxemia: Secondary | ICD-10-CM | POA: Diagnosis not present

## 2019-09-22 DIAGNOSIS — M1990 Unspecified osteoarthritis, unspecified site: Secondary | ICD-10-CM | POA: Diagnosis present

## 2019-09-22 DIAGNOSIS — N179 Acute kidney failure, unspecified: Secondary | ICD-10-CM

## 2019-09-22 DIAGNOSIS — R6521 Severe sepsis with septic shock: Secondary | ICD-10-CM | POA: Diagnosis not present

## 2019-09-22 DIAGNOSIS — R7303 Prediabetes: Secondary | ICD-10-CM | POA: Diagnosis present

## 2019-09-22 DIAGNOSIS — Z4659 Encounter for fitting and adjustment of other gastrointestinal appliance and device: Secondary | ICD-10-CM

## 2019-09-22 DIAGNOSIS — Z515 Encounter for palliative care: Secondary | ICD-10-CM | POA: Diagnosis not present

## 2019-09-22 DIAGNOSIS — Z7901 Long term (current) use of anticoagulants: Secondary | ICD-10-CM

## 2019-09-22 DIAGNOSIS — J9690 Respiratory failure, unspecified, unspecified whether with hypoxia or hypercapnia: Secondary | ICD-10-CM

## 2019-09-22 DIAGNOSIS — I110 Hypertensive heart disease with heart failure: Secondary | ICD-10-CM | POA: Diagnosis present

## 2019-09-22 DIAGNOSIS — I4891 Unspecified atrial fibrillation: Secondary | ICD-10-CM | POA: Diagnosis not present

## 2019-09-22 DIAGNOSIS — J80 Acute respiratory distress syndrome: Secondary | ICD-10-CM | POA: Diagnosis present

## 2019-09-22 DIAGNOSIS — N170 Acute kidney failure with tubular necrosis: Secondary | ICD-10-CM | POA: Diagnosis not present

## 2019-09-22 DIAGNOSIS — E66813 Obesity, class 3: Secondary | ICD-10-CM

## 2019-09-22 DIAGNOSIS — R040 Epistaxis: Secondary | ICD-10-CM | POA: Diagnosis not present

## 2019-09-22 DIAGNOSIS — J9600 Acute respiratory failure, unspecified whether with hypoxia or hypercapnia: Secondary | ICD-10-CM

## 2019-09-22 DIAGNOSIS — U071 COVID-19: Secondary | ICD-10-CM | POA: Diagnosis present

## 2019-09-22 LAB — CBC WITH DIFFERENTIAL/PLATELET
Abs Immature Granulocytes: 0.05 10*3/uL (ref 0.00–0.07)
Basophils Absolute: 0 10*3/uL (ref 0.0–0.1)
Basophils Relative: 0 %
Eosinophils Absolute: 0.2 10*3/uL (ref 0.0–0.5)
Eosinophils Relative: 2 %
HCT: 49.2 % (ref 39.0–52.0)
Hemoglobin: 16.4 g/dL (ref 13.0–17.0)
Immature Granulocytes: 1 %
Lymphocytes Relative: 5 %
Lymphs Abs: 0.6 10*3/uL — ABNORMAL LOW (ref 0.7–4.0)
MCH: 30 pg (ref 26.0–34.0)
MCHC: 33.3 g/dL (ref 30.0–36.0)
MCV: 90.1 fL (ref 80.0–100.0)
Monocytes Absolute: 0.3 10*3/uL (ref 0.1–1.0)
Monocytes Relative: 3 %
Neutro Abs: 9.8 10*3/uL — ABNORMAL HIGH (ref 1.7–7.7)
Neutrophils Relative %: 89 %
Platelets: 153 10*3/uL (ref 150–400)
RBC: 5.46 MIL/uL (ref 4.22–5.81)
RDW: 15.6 % — ABNORMAL HIGH (ref 11.5–15.5)
WBC: 10.9 10*3/uL — ABNORMAL HIGH (ref 4.0–10.5)
nRBC: 0 % (ref 0.0–0.2)

## 2019-09-22 LAB — PROCALCITONIN: Procalcitonin: 0.15 ng/mL

## 2019-09-22 LAB — COMPREHENSIVE METABOLIC PANEL
ALT: 34 U/L (ref 0–44)
AST: 31 U/L (ref 15–41)
Albumin: 3.7 g/dL (ref 3.5–5.0)
Alkaline Phosphatase: 57 U/L (ref 38–126)
Anion gap: 11 (ref 5–15)
BUN: 15 mg/dL (ref 8–23)
CO2: 24 mmol/L (ref 22–32)
Calcium: 8.5 mg/dL — ABNORMAL LOW (ref 8.9–10.3)
Chloride: 101 mmol/L (ref 98–111)
Creatinine, Ser: 0.84 mg/dL (ref 0.61–1.24)
GFR calc Af Amer: >60 mL/min (ref >60)
GFR calc non Af Amer: >60 mL/min (ref >60)
Glucose, Bld: 126 mg/dL — ABNORMAL HIGH (ref 70–99)
Potassium: 4.2 mmol/L (ref 3.5–5.1)
Sodium: 136 mmol/L (ref 135–145)
Total Bilirubin: 1.5 mg/dL — ABNORMAL HIGH (ref 0.3–1.2)
Total Protein: 7.3 g/dL (ref 6.5–8.1)

## 2019-09-22 LAB — GLUCOSE, CAPILLARY
Glucose-Capillary: 149 mg/dL — ABNORMAL HIGH (ref 70–99)
Glucose-Capillary: 177 mg/dL — ABNORMAL HIGH (ref 70–99)

## 2019-09-22 LAB — LACTIC ACID, PLASMA
Lactic Acid, Venous: 2.3 mmol/L (ref 0.5–1.9)
Lactic Acid, Venous: 2.7 mmol/L (ref 0.5–1.9)
Lactic Acid, Venous: 1.1 mmol/L (ref 0.5–1.9)

## 2019-09-22 LAB — D-DIMER, QUANTITATIVE: D-Dimer, Quant: 4.17 ug/mL-FEU — ABNORMAL HIGH (ref 0.00–0.50)

## 2019-09-22 LAB — BRAIN NATRIURETIC PEPTIDE: B Natriuretic Peptide: 44.4 pg/mL (ref 0.0–100.0)

## 2019-09-22 LAB — C-REACTIVE PROTEIN: CRP: 6.3 mg/dL — ABNORMAL HIGH (ref <1.0)

## 2019-09-22 LAB — FERRITIN: Ferritin: 224 ng/mL (ref 24–336)

## 2019-09-22 MED ORDER — DEXAMETHASONE SODIUM PHOSPHATE 10 MG/ML IJ SOLN
10.0000 mg | Freq: Once | INTRAMUSCULAR | Status: AC
  Administered 2019-09-22: 10 mg via INTRAVENOUS
  Filled 2019-09-22: qty 1

## 2019-09-22 MED ORDER — METHYLPREDNISOLONE SODIUM SUCC 125 MG IJ SOLR
60.0000 mg | Freq: Two times a day (BID) | INTRAMUSCULAR | Status: DC
  Administered 2019-09-22 – 2019-09-24 (×3): 60 mg via INTRAVENOUS
  Filled 2019-09-22 (×3): qty 2

## 2019-09-22 MED ORDER — ZINC SULFATE 220 (50 ZN) MG PO CAPS
220.0000 mg | ORAL_CAPSULE | Freq: Every day | ORAL | Status: DC
  Administered 2019-09-23 – 2019-09-28 (×5): 220 mg via ORAL
  Filled 2019-09-22 (×6): qty 1

## 2019-09-22 MED ORDER — SODIUM CHLORIDE 0.9 % IV SOLN
2.0000 g | INTRAVENOUS | Status: AC
  Administered 2019-09-22 – 2019-09-26 (×5): 2 g via INTRAVENOUS
  Filled 2019-09-22 (×5): qty 20

## 2019-09-22 MED ORDER — SODIUM CHLORIDE (PF) 0.9 % IJ SOLN
INTRAMUSCULAR | Status: AC
  Administered 2019-09-22: 10 mL
  Filled 2019-09-22: qty 50

## 2019-09-22 MED ORDER — ASCORBIC ACID 500 MG PO TABS
500.0000 mg | ORAL_TABLET | Freq: Every day | ORAL | Status: DC
  Administered 2019-09-23 – 2019-09-28 (×5): 500 mg via ORAL
  Filled 2019-09-22 (×6): qty 1

## 2019-09-22 MED ORDER — LATANOPROST 0.005 % OP SOLN
1.0000 [drp] | Freq: Every day | OPHTHALMIC | Status: DC
  Administered 2019-09-23 – 2019-10-13 (×19): 1 [drp] via OPHTHALMIC
  Filled 2019-09-22 (×4): qty 2.5

## 2019-09-22 MED ORDER — ALBUTEROL SULFATE HFA 108 (90 BASE) MCG/ACT IN AERS
4.0000 | INHALATION_SPRAY | Freq: Once | RESPIRATORY_TRACT | Status: AC
  Administered 2019-09-22: 4 via RESPIRATORY_TRACT
  Filled 2019-09-22: qty 6.7

## 2019-09-22 MED ORDER — APIXABAN 5 MG PO TABS
5.0000 mg | ORAL_TABLET | Freq: Two times a day (BID) | ORAL | Status: DC
  Administered 2019-09-23 – 2019-09-28 (×11): 5 mg via ORAL
  Filled 2019-09-22 (×12): qty 1

## 2019-09-22 MED ORDER — IPRATROPIUM-ALBUTEROL 20-100 MCG/ACT IN AERS
1.0000 | INHALATION_SPRAY | Freq: Four times a day (QID) | RESPIRATORY_TRACT | Status: DC
  Administered 2019-09-23 – 2019-09-26 (×13): 1 via RESPIRATORY_TRACT
  Filled 2019-09-22: qty 4

## 2019-09-22 MED ORDER — PANTOPRAZOLE SODIUM 40 MG PO TBEC
40.0000 mg | DELAYED_RELEASE_TABLET | Freq: Every day | ORAL | Status: DC
  Administered 2019-09-23 – 2019-09-25 (×3): 40 mg via ORAL
  Filled 2019-09-22 (×5): qty 1

## 2019-09-22 MED ORDER — IOHEXOL 350 MG/ML SOLN
100.0000 mL | Freq: Once | INTRAVENOUS | Status: AC | PRN
  Administered 2019-09-22: 100 mL via INTRAVENOUS

## 2019-09-22 MED ORDER — INSULIN ASPART 100 UNIT/ML ~~LOC~~ SOLN
0.0000 [IU] | Freq: Every day | SUBCUTANEOUS | Status: DC
  Administered 2019-09-23: 2 [IU] via SUBCUTANEOUS
  Administered 2019-09-26: 3 [IU] via SUBCUTANEOUS
  Administered 2019-09-27: 22:00:00 4 [IU] via SUBCUTANEOUS
  Administered 2019-09-29: 2 [IU] via SUBCUTANEOUS
  Filled 2019-09-22: qty 0.05

## 2019-09-22 MED ORDER — HYDROCOD POLST-CPM POLST ER 10-8 MG/5ML PO SUER
5.0000 mL | Freq: Two times a day (BID) | ORAL | Status: DC | PRN
  Administered 2019-09-24 – 2019-10-02 (×6): 5 mL via ORAL
  Filled 2019-09-22 (×6): qty 5

## 2019-09-22 MED ORDER — INSULIN ASPART 100 UNIT/ML ~~LOC~~ SOLN
0.0000 [IU] | Freq: Three times a day (TID) | SUBCUTANEOUS | Status: DC
  Administered 2019-09-23: 1 [IU] via SUBCUTANEOUS
  Administered 2019-09-23 – 2019-09-24 (×5): 2 [IU] via SUBCUTANEOUS
  Administered 2019-09-25: 1 [IU] via SUBCUTANEOUS
  Administered 2019-09-25 (×2): 2 [IU] via SUBCUTANEOUS
  Administered 2019-09-26: 3 [IU] via SUBCUTANEOUS
  Administered 2019-09-26 – 2019-09-27 (×2): 5 [IU] via SUBCUTANEOUS
  Administered 2019-09-27: 08:00:00 2 [IU] via SUBCUTANEOUS
  Administered 2019-09-27: 13:00:00 3 [IU] via SUBCUTANEOUS
  Administered 2019-09-28: 16:00:00 2 [IU] via SUBCUTANEOUS
  Administered 2019-09-29: 18:00:00 3 [IU] via SUBCUTANEOUS
  Administered 2019-09-29: 2 [IU] via SUBCUTANEOUS
  Administered 2019-09-29: 12:00:00 3 [IU] via SUBCUTANEOUS
  Filled 2019-09-22: qty 0.09

## 2019-09-22 MED ORDER — SODIUM CHLORIDE 0.9 % IV BOLUS
1000.0000 mL | Freq: Once | INTRAVENOUS | Status: AC
  Administered 2019-09-22: 1000 mL via INTRAVENOUS

## 2019-09-22 MED ORDER — SODIUM CHLORIDE 0.9 % IV SOLN
500.0000 mg | INTRAVENOUS | Status: AC
  Administered 2019-09-22 – 2019-09-26 (×5): 500 mg via INTRAVENOUS
  Filled 2019-09-22 (×5): qty 500

## 2019-09-22 MED ORDER — GUAIFENESIN-DM 100-10 MG/5ML PO SYRP
10.0000 mL | ORAL_SOLUTION | ORAL | Status: DC | PRN
  Administered 2019-09-26: 10 mL via ORAL
  Filled 2019-09-22: qty 10

## 2019-09-22 NOTE — H&P (Signed)
History and Physical    CHEO SELVEY EPP:[id number] DOB: 1956/02/22 DOA: 23-Sep-2019  PCP: [name], PA-C  Patient coming from: home  I have personally briefly reviewed patient's old medical records in [facility]  Chief Complaint: shortness of breath  HPI: Roy Lee is Roy Lee 64 y.o. male with medical history significant of  HTN, obesity, pulmonary embolism on eliquis who presents with worsening shortness of breath.  He was recently discharged from [facility] after treatment for COVID pneumonia.  He received steroids, remdesivir, and actemra and was discharged on 3 L with 5 days of dexamethasone to complete.  He notes he had about 1 good week at home, then since then, he's had progressive shortness of breath.  Feels like he's digressed over the past week.  He was seen in the ED on 2/6 and it was thought that fluid retention may be contributing to his symptoms and his lasix was restarted.  This morning, he notes he was short of breath and couldn't get his O2 sats up (they were stuck in the 80's).  He noted chest tightness with deep breaths.  Notes SOB with activity.  No fevers, chills, CP, change in LE swelling, abdominal pain, nausea, vomiting, diarrhea.  No smoking.  No significant etoh use in 1 month.  ED Course: Labs, imaging, steroids.  Admit for acute hypoxic respiratory failure 2/2 COVID pneumonia.   Review of Systems: As per HPI otherwise 10 point review of systems negative.   Past Medical History:  Diagnosis Date  . Arthritis   . Hypertension   . Obesity     History reviewed. No pertinent surgical history.   reports that he has never smoked. He has never used smokeless tobacco. He reports current alcohol use. He reports that he does not use drugs.  No Known Allergies  History reviewed. No pertinent family history.  Prior to Admission medications   Medication Sig Start Date End Date Taking? Authorizing Provider  apixaban (ELIQUIS) 5 MG TABS tablet  Please take 10 mg oral twice daily for next 5 days, then transition to 5 mg oral twice daily on 09/14/2019 Patient taking differently: Take 5 mg by mouth 2 (two) times daily.  09/09/19  Yes Elgergawy, Leana Roe, MD  dextromethorphan-guaiFENesin (MUCINEX DM) 30-600 MG 12hr tablet Take 1 tablet by mouth 2 (two) times daily as needed for cough.   Yes [provider]  furosemide (LASIX) 20 MG tablet Take 1 tablet (20 mg total) by mouth daily. 09/16/19  Yes Wynetta Fines, MD  ibuprofen (ADVIL) 200 MG tablet Take 600-800 mg by mouth every 6 (six) hours as needed for fever, headache or moderate pain.   Yes [provider]  latanoprost (XALATAN) 0.005 % ophthalmic solution Place 1 drop into both eyes daily. 07/31/19  Yes [provider]  lisinopril (ZESTRIL) 40 MG tablet Take 40 mg by mouth daily.   Yes [provider]  Multiple Vitamin (MULTIVITAMIN WITH MINERALS) TABS tablet Take 1 tablet by mouth daily.   Yes [provider]  vitamin C (ASCORBIC ACID) 500 MG tablet Take 500 mg by mouth daily.   Yes [provider]  acetaminophen (TYLENOL) 325 MG tablet Take 2 tablets (650 mg total) by mouth every 6 (six) hours as needed for mild pain or headache (fever >/= 101). Patient not taking: Reported on 09-23-19 09/09/19   Elgergawy, Leana Roe, MD  dexamethasone (DECADRON) 4 MG tablet Take 2 tablets (8 mg total) by mouth daily. Patient  not taking: Reported on 09/11/2019 09/10/19   Elgergawy, Leana Roe, MD  pantoprazole (PROTONIX) 40 MG tablet Take 1 tablet (40 mg total) by mouth daily. Patient not taking: Reported on 10/04/2019 09/09/19 10/09/19  Elgergawy, Leana Roe, MD    Physical Exam: Vitals:   09/28/2019 1433 10/02/2019 1500 09/13/2019 1613 10/01/2019 1630  BP: 121/74 125/70 101/81 (!) 109/50  Pulse: 85 89 91 89  Resp: (!) 27 (!) 28 20 (!) 23  Temp:      TempSrc:      SpO2: 98% 98% 93% 94%  Weight:      Height:        Constitutional: NAD, calm,  comfortable Vitals:   09/12/2019 1433 09/11/2019 1500 10/02/2019 1613 09/17/2019 1630  BP: 121/74 125/70 101/81 (!) 109/50  Pulse: 85 89 91 89  Resp: (!) 27 (!) 28 20 (!) 23  Temp:      TempSrc:      SpO2: 98% 98% 93% 94%  Weight:      Height:       Eyes: PERRL, lids and conjunctivae normal ENMT: Mucous membranes are moist. Posterior pharynx clear of any exudate or lesions.Normal dentition.  Neck: normal, supple, no masses, no thyromegaly Respiratory: mild tachypnea, no accessory muscle use, speaking in complete sentences  Cardiovascular: Regular rate and rhythm, no murmurs / rubs / gallops. No extremity edema. Abdomen: no tenderness, no masses palpated. No hepatosplenomegaly. Bowel sounds positive.  Musculoskeletal: no clubbing / cyanosis. No joint deformity upper and lower extremities. Good ROM, no contractures. Normal muscle tone.  Skin: no rashes, lesions, ulcers. No induration Neurologic: CN 2-12 grossly intact. Sensation intact. Moving all extremities. Psychiatric: Normal judgment and insight. Alert and oriented x 3. Normal mood.   Labs on Admission: I have personally reviewed following labs and imaging studies  CBC: Recent Labs  Lab 09/16/19 1235 09/20/2019 1218  WBC 9.8 10.9*  NEUTROABS 8.0* 9.8*  HGB 14.8 16.4  HCT 43.7 49.2  MCV 88.5 90.1  PLT 193 153   Basic Metabolic Panel: Recent Labs  Lab 09/16/19 1235 09/25/2019 1218  NA 139 136  K 4.1 4.2  CL 106 101  CO2 25 24  GLUCOSE 97 126*  BUN 16 15  CREATININE 0.96 0.84  CALCIUM 8.3* 8.5*   GFR: Estimated Creatinine Clearance: 120.6 mL/min (by C-G formula based on SCr of 0.84 mg/dL). Liver Function Tests: Recent Labs  Lab 09/16/19 1235 09/30/2019 1218  AST 23 31  ALT 36 34  ALKPHOS 49 57  BILITOT 0.8 1.5*  PROT 6.0* 7.3  ALBUMIN 3.2* 3.7   No results for input(s): LIPASE, AMYLASE in the last 168 hours. No results for input(s): AMMONIA in the last 168 hours. Coagulation Profile: Recent Labs  Lab  09/16/19 1235  INR 1.1   Cardiac Enzymes: No results for input(s): CKTOTAL, CKMB, CKMBINDEX, TROPONINI in the last 168 hours. BNP (last 3 results) No results for input(s): PROBNP in the last 8760 hours. HbA1C: No results for input(s): HGBA1C in the last 72 hours. CBG: No results for input(s): GLUCAP in the last 168 hours. Lipid Profile: No results for input(s): CHOL, HDL, LDLCALC, TRIG, CHOLHDL, LDLDIRECT in the last 72 hours. Thyroid Function Tests: No results for input(s): TSH, T4TOTAL, FREET4, T3FREE, THYROIDAB in the last 72 hours. Anemia Panel: No results for input(s): VITAMINB12, FOLATE, FERRITIN, TIBC, IRON, RETICCTPCT in the last 72 hours. Urine analysis:    Component Value Date/Time   COLORURINE YELLOW 05/25/2011 1100   APPEARANCEUR CLEAR 05/25/2011 1100  LABSPEC 1.007 05/25/2011 1100   PHURINE 6.0 05/25/2011 1100   GLUCOSEU NEGATIVE 05/25/2011 1100   HGBUR NEGATIVE 05/25/2011 1100   BILIRUBINUR NEGATIVE 05/25/2011 1100   KETONESUR NEGATIVE 05/25/2011 1100   PROTEINUR NEGATIVE 05/25/2011 1100   UROBILINOGEN 0.2 05/25/2011 1100   NITRITE NEGATIVE 05/25/2011 1100   LEUKOCYTESUR NEGATIVE 05/25/2011 1100    Radiological Exams on Admission: CT Angio Chest PE W and/or Wo Contrast  Result Date: 10/20/2019 CLINICAL DATA:  Shortness of breath, COVID-19 08/31/2019, hypoxia EXAM: CT ANGIOGRAPHY CHEST WITH CONTRAST TECHNIQUE: Multidetector CT imaging of the chest was performed using the standard protocol during bolus administration of intravenous contrast. Multiplanar CT image reconstructions and MIPs were obtained to evaluate the vascular anatomy. CONTRAST:  168mL OMNIPAQUE IOHEXOL 350 MG/ML SOLN COMPARISON:  09/16/2019 FINDINGS: Cardiovascular: This is Devantae Babe technically adequate evaluation of the pulmonary vasculature. No filling defects or pulmonary emboli. Heart is unremarkable without pericardial effusion. Thoracic aorta is normal in caliber. Mediastinum/Nodes: Borderline  enlarged lymph nodes within the mediastinum are stable, likely reactive. The largest lymph node in the pretracheal region measures 14 mm in short axis reference image 47. Borderline enlarged bilateral hilar lymph nodes are unchanged. Lungs/Pleura: Since the previous exam, there are increasing areas of bilateral multifocal airspace disease consistent with progressive multifocal pneumonia. No effusion or pneumothorax. The central airways are patent. Upper Abdomen: No acute abnormality. Musculoskeletal: No acute or destructive bony lesions. Reconstructed images demonstrate no additional findings. Review of the MIP images confirms the above findings. IMPRESSION: 1. No evidence of pulmonary embolus. 2. Worsening bilateral airspace disease consistent with progressive multifocal pneumonia. Findings are consistent with known COVID-19. 3. Reactive mediastinal adenopathy. Electronically Signed   By: [name] M.D.   On: 10/20/19 16:13   DG Chest Portable 1 View  Result Date: October 20, 2019 CLINICAL DATA:  Shortness of breath. COVID-19 pneumonia. Increased cough. Decreased O2 saturation. EXAM: PORTABLE CHEST 1 VIEW COMPARISON:  CT scan and chest x-ray dated 09/16/2019 and chest x-ray dated 09/03/2019 FINDINGS: There is been slight progression of the peripheral infiltrate in the left upper lobe. The infiltrates on the right and the infiltrates at the left base are unchanged. Heart size and pulmonary vascularity remain normal. No discrete effusions. IMPRESSION: Persistent bilateral pulmonary infiltrates, progressed in the left upper lobe. Electronically Signed   By: [name] M.D.   On: 10/20/2019 12:44    EKG: Independently reviewed. none  Assessment/Plan Active Problems:   * No active hospital problems. *  Acute Hypoxic Respiratory Failure 2/2 COVID 19 Infection  Possible Bacterial Pneumonia: Discharged on 1/30 after treatment for COVID 19 pneumonia with steroids, remdesivir, and actemra.  He was  discharged from this admission with dexamethasone, which he's completed. Presenting with worsening hypoxia and SOB today -> suspect hypoxia related to COVID pneumonia vs developing fibrosis vs bacterial pneumonia.  Was discharged on 3 L, now requiring 6 L by  CT PE protocol with worsening bilateral airspace disease c/w progressive multifocal pneumonia Will treat for possible bacterial pneumonia with ceftriaxone/azithromycin Follow sputum cx Restart steroids Blood cultures Follow inflammatory labs I/O, daily weights IS, OOB, prone as able  COVID-19 Labs  No results for input(s): DDIMER, FERRITIN, LDH, CRP in the last 72 hours.  No results found for: [id number]  Elevated Lactate: follow after bolus  Bilateral PE's: noted on CT from 1/25.  Continue eliquis.   Hypertension: holding lasix and lisinopril at this time  Obesity Body mass index is 36.25 kg/m.  Prediabetes: SSI, follow - adjust as  needed  GERD: PPI  DVT prophylaxis: eliquis  Code Status: full  Family Communication: none at bedside, declined me calling  Disposition Plan: pending further improvement, admit to [facility]  Consults called: none  Admission status: inpatient given AHRF 2/2 COVID 19    Lacretia Nicks MD [facility] Pager AMION  If 7PM-7AM, please contact night-coverage [email or web address] Use universal [place] password for that web site. If you do not have the password, please call the hospital operator.  October 19, 2019, 5:05 PM

## 2019-09-22 NOTE — ED Notes (Signed)
ED TO INPATIENT HANDOFF REPORT  Name/Age/Gender Roy Lee 64 y.o. male  Code Status Code Status History    Date Active Date Inactive Code Status Order ID Comments User Context   09/04/2019 [phone] 09/09/2019 2201 Full Code [id number]  Acheampong, Genice Rouge, MD Inpatient   Advance Care Planning Activity      Home/SNF/Other Home  Chief Complaint COVID-19 virus infection [U07.1]  Level of Care/Admitting Diagnosis ED Disposition    ED Disposition Condition Comment   Admit  Hospital Area: [facility] [[id number]]  Level of Care: Telemetry [5]  Covid Evaluation: Confirmed COVID Positive  Diagnosis: COVID-19 virus infection [[id number]]  Admitting Physician: Zigmund Daniel [phone]  Attending Physician: Shaune Spittle, A CALDWELL [phone]  Estimated length of stay: past midnight tomorrow  Certification:: I certify this patient will need inpatient services for at least 2 midnights       Medical History Past Medical History:  Diagnosis Date  . Arthritis   . Hypertension   . Obesity     Allergies No Known Allergies  IV Location/Drains/Wounds Patient Lines/Drains/Airways Status   Active Line/Drains/Airways    Name:   Placement date:   Placement time:   Site:   Days:   Peripheral IV Oct 03, 2019 Left Antecubital   10/03/2019    1216    Antecubital   less than 1          Labs/Imaging Results for orders placed or performed during the hospital encounter of Oct 03, 2019 (from the past 48 hour(s))  Comprehensive metabolic panel     Status: Abnormal   Collection Time: 10-03-19 12:18 PM  Result Value Ref Range   Sodium 136 135 - 145 mmol/L   Potassium 4.2 3.5 - 5.1 mmol/L   Chloride 101 98 - 111 mmol/L   CO2 24 22 - 32 mmol/L   Glucose, Bld 126 (H) 70 - 99 mg/dL   BUN 15 8 - 23 mg/dL   Creatinine, Ser 1.02 0.61 - 1.24 mg/dL   Calcium 8.5 (L) 8.9 - 10.3 mg/dL   Total Protein 7.3 6.5 - 8.1 g/dL   Albumin 3.7 3.5 - 5.0 g/dL   AST 31 15 - 41 U/L   ALT 34 0 - 44  U/L   Alkaline Phosphatase 57 38 - 126 U/L   Total Bilirubin 1.5 (H) 0.3 - 1.2 mg/dL   GFR calc non Af Amer >60 >60 mL/min   GFR calc Af Amer >60 >60 mL/min   Anion gap 11 5 - 15    Comment: Performed at [facility], [address], [place], [place] [address]  CBC with Differential     Status: Abnormal   Collection Time: 10/03/2019 12:18 PM  Result Value Ref Range   WBC 10.9 (H) 4.0 - 10.5 K/uL   RBC 5.46 4.22 - 5.81 MIL/uL   Hemoglobin 16.4 13.0 - 17.0 g/dL   HCT 64.4 03.4 - 74.2 %   MCV 90.1 80.0 - 100.0 fL   MCH 30.0 26.0 - 34.0 pg   MCHC 33.3 30.0 - 36.0 g/dL   RDW 59.5 (H) 63.8 - 75.6 %   Platelets 153 150 - 400 K/uL   nRBC 0.0 0.0 - 0.2 %   Neutrophils Relative % 89 %   Neutro Abs 9.8 (H) 1.7 - 7.7 K/uL   Lymphocytes Relative 5 %   Lymphs Abs 0.6 (L) 0.7 - 4.0 K/uL   Monocytes Relative 3 %   Monocytes Absolute 0.3 0.1 - 1.0  K/uL   Eosinophils Relative 2 %   Eosinophils Absolute 0.2 0.0 - 0.5 K/uL   Basophils Relative 0 %   Basophils Absolute 0.0 0.0 - 0.1 K/uL   Immature Granulocytes 1 %   Abs Immature Granulocytes 0.05 0.00 - 0.07 K/uL    Comment: Performed at [facility], [place] [address], [place], [place] [address]  Brain natriuretic peptide     Status: None   Collection Time: 10-22-19 12:19 PM  Result Value Ref Range   B Natriuretic Peptide 44.4 0.0 - 100.0 pg/mL    Comment: Performed at [facility], [place] [address], [place], [place] [address]  Blood gas, venous (at [facility] and [facility], not at [facility])     Status: Abnormal   Collection Time: 10-22-19 12:19 PM  Result Value Ref Range   pH, Ven 7.357 7.250 - 7.430   pCO2, Ven 47.9 44.0 - 60.0 mmHg   pO2, Ven <31.0 (LL) 32.0 - 45.0 mmHg   Bicarbonate 26.2 20.0 - 28.0 mmol/L   Acid-Base Excess 0.4 0.0 - 2.0 mmol/L   O2 Saturation 48.3 %   Patient temperature 98.6     Comment: Performed at [facility], [place] [address], [place], [place] [address]  Lactic  acid, plasma     Status: Abnormal   Collection Time: 10-22-2019 12:22 PM  Result Value Ref Range   Lactic Acid, Venous 2.3 (HH) 0.5 - 1.9 mmol/L    Comment: CRITICAL RESULT CALLED TO, READ BACK BY AND VERIFIED WITHVernon Prey RN AT 2130 2019/10/22 MULLINS,T Performed at [facility], [place] [address], [place], [place] [address]   Culture, blood (routine x 2)     Status: None (Preliminary result)   Collection Time: 10/22/2019  2:39 PM   Specimen: BLOOD RIGHT HAND  Result Value Ref Range   Specimen Description      BLOOD RIGHT HAND Performed at [place] [address] [place], [place] [address]    Special Requests      BOTTLES DRAWN AEROBIC ONLY Blood Culture results may not be optimal due to an inadequate volume of blood received in culture bottles Performed at [facility] [address] [place], [place] [address]    Culture PENDING    Report Status PENDING   Lactic acid, plasma     Status: Abnormal   Collection Time: 10-22-2019  2:39 PM  Result Value Ref Range   Lactic Acid, Venous 2.7 (HH) 0.5 - 1.9 mmol/L    Comment: CRITICAL RESULT CALLED TO, READ BACK BY AND VERIFIED WITH: Ballard Russell RN 1538 10-22-2019 JM Performed at [facility], [place] [address], [place], [place] [address]   Procalcitonin - Baseline     Status: None   Collection Time: 10-22-2019  4:41 PM  Result Value Ref Range   Procalcitonin 0.15 ng/mL    Comment:        Interpretation: PCT (Procalcitonin) <= 0.5 ng/mL: Systemic infection (sepsis) is not likely. Local bacterial infection is possible. (NOTE)       Sepsis PCT Algorithm           Lower Respiratory Tract                                      Infection PCT Algorithm    ----------------------------     ----------------------------  PCT < 0.25 ng/mL                PCT < 0.10 ng/mL         Strongly encourage             Strongly discourage   discontinuation of antibiotics    initiation of  antibiotics    ----------------------------     -----------------------------       PCT 0.25 - 0.50 ng/mL            PCT 0.10 - 0.25 ng/mL               OR       >80% decrease in PCT            Discourage initiation of                                            antibiotics      Encourage discontinuation           of antibiotics    ----------------------------     -----------------------------         PCT >= 0.50 ng/mL              PCT 0.26 - 0.50 ng/mL               AND        <80% decrease in PCT             Encourage initiation of                                             antibiotics       Encourage continuation           of antibiotics    ----------------------------     -----------------------------        PCT >= 0.50 ng/mL                  PCT > 0.50 ng/mL               AND         increase in PCT                  Strongly encourage                                      initiation of antibiotics    Strongly encourage escalation           of antibiotics                                     -----------------------------                                           PCT <= 0.25 ng/mL  OR                                        > 80% decrease in PCT                                     Discontinue / Do not initiate                                             antibiotics Performed at [facility], [address], [place], [place] [address]    CT Angio Chest PE W and/or Wo Contrast  Result Date: 10-14-2019 CLINICAL DATA:  Shortness of breath, COVID-19 08/31/2019, hypoxia EXAM: CT ANGIOGRAPHY CHEST WITH CONTRAST TECHNIQUE: Multidetector CT imaging of the chest was performed using the standard protocol during bolus administration of intravenous contrast. Multiplanar CT image reconstructions and MIPs were obtained to evaluate the vascular anatomy. CONTRAST:  OMNIPAQUE IOHEXOL 350 MG/ML SOLN COMPARISON:  09/16/2019  FINDINGS: Cardiovascular: This is a technically adequate evaluation of the pulmonary vasculature. No filling defects or pulmonary emboli. Heart is unremarkable without pericardial effusion. Thoracic aorta is normal in caliber. Mediastinum/Nodes: Borderline enlarged lymph nodes within the mediastinum are stable, likely reactive. The largest lymph node in the pretracheal region measures 14 mm in short axis reference image 47. Borderline enlarged bilateral hilar lymph nodes are unchanged. Lungs/Pleura: Since the previous exam, there are increasing areas of bilateral multifocal airspace disease consistent with progressive multifocal pneumonia. No effusion or pneumothorax. The central airways are patent. Upper Abdomen: No acute abnormality. Musculoskeletal: No acute or destructive bony lesions. Reconstructed images demonstrate no additional findings. Review of the MIP images confirms the above findings. IMPRESSION: 1. No evidence of pulmonary embolus. 2. Worsening bilateral airspace disease consistent with progressive multifocal pneumonia. Findings are consistent with known COVID-19. 3. Reactive mediastinal adenopathy. Electronically Signed   By: Sharlet Salina M.D.   On: 10/14/2019 16:13   DG Chest Portable 1 View  Result Date: 10/14/2019 CLINICAL DATA:  Shortness of breath. COVID-19 pneumonia. Increased cough. Decreased O2 saturation. EXAM: PORTABLE CHEST 1 VIEW COMPARISON:  CT scan and chest x-ray dated 09/16/2019 and chest x-ray dated 09/03/2019 FINDINGS: There is been slight progression of the peripheral infiltrate in the left upper lobe. The infiltrates on the right and the infiltrates at the left base are unchanged. Heart size and pulmonary vascularity remain normal. No discrete effusions. IMPRESSION: Persistent bilateral pulmonary infiltrates, progressed in the left upper lobe. Electronically Signed   By: Francene Boyers M.D.   On: October 14, 2019 12:44    Pending Labs Unresulted Labs (From admission, onward)     Start     Ordered   09/23/19 0500  Procalcitonin  Daily,   R     14-Oct-2019 1640   2019-10-14 1755  Ferritin  Once,   STAT     10-14-2019 1754   2019/10/14 1754  C-reactive protein  Once,   STAT     14-Oct-2019 1754   10-14-2019 1754  D-dimer, quantitative (not at [facility])  Once,   STAT     Oct 14, 2019 1754   2019/10/14 1753  Lactic acid, plasma  ONCE - STAT,   STAT  Oct 04, 2019 1752   04-Oct-2019 1218  Culture, blood (routine x 2)  BLOOD CULTURE X 2,   STAT     10-04-2019 1218   Signed and Held  ABO/Rh  Once,   R     Signed and Held   Signed and Held  CBC with Differential/Platelet  Daily,   R     Signed and Held   Signed and Held  Comprehensive metabolic panel  Daily,   R     Signed and Held   Signed and Held  C-reactive protein  Daily,   R     Signed and Held   Signed and Held  D-dimer, quantitative (not at [facility])  Daily,   R     Signed and Held   Signed and Held  Ferritin  Daily,   R     Signed and Held   Signed and Held  Magnesium  Daily,   R     Signed and Held   Signed and Held  Phosphorus  Daily,   R     Signed and Held   Signed and Held  HIV Antibody (routine testing w rflx)  (HIV Antibody (Routine testing w reflex) panel)  Once,   R     Signed and Held   Signed and Held  Sputum culture  (Non-severe pneumonia (non-ICU care) in adult without resistant organism risk factors.)  Once,   R     Signed and Held          Vitals/Pain Today's Vitals   10-04-19 1613 04-Oct-2019 1630 Oct 04, 2019 1700 10/04/2019 1730  BP: 101/81 (!) 109/50 (!) 109/55 125/64  Pulse: 91 89 92 88  Resp: 20 (!) 23 (!) 23 (!) 22  Temp:      TempSrc:      SpO2: 93% 94% 94% 96%  Weight:      Height:        Isolation Precautions No active isolations  Medications Medications  sodium chloride (PF) 0.9 % injection (has no administration in time range)  cefTRIAXone (ROCEPHIN) 2 g in sodium chloride 0.9 % 100 mL IVPB (has no administration in time range)  azithromycin (ZITHROMAX) 500 mg in sodium chloride 0.9 % 250 mL IVPB  (has no administration in time range)  albuterol (VENTOLIN HFA) 108 (90 Base) MCG/ACT inhaler 4 puff (4 puffs Inhalation Given 04-Oct-2019 1445)  iohexol (OMNIPAQUE) 350 MG/ML injection 100 mL (100 mLs Intravenous Contrast Given 10/04/19 1555)  sodium chloride 0.9 % bolus 1,000 mL (1,000 mLs Intravenous New Bag/Given (Non-Interop) 10-04-2019 1731)  dexamethasone (DECADRON) injection 10 mg (10 mg Intravenous Given 10-04-2019 1731)    Mobility walks

## 2019-09-22 NOTE — ED Triage Notes (Signed)
Pt reports diagnosed with COVID on 08/31/19. Went to [facility] and discharged on 1/30 still positive. Pt came here on 2/6 with increased shob. Pt is here again because shob was getting better and now it has gotten worse again. Pt has been checking pulse ox at home and it went as low as 65% on 4L. Pt now on 6L with SpO2 at 93%.

## 2019-09-22 NOTE — ED Notes (Signed)
[facility] called for transport. 

## 2019-09-22 NOTE — ED Provider Notes (Signed)
[facility]-EMERGENCY DEPT Provider Note   CSN: [id number] Arrival date & time: 10-10-2019  1147     History Chief Complaint  Patient presents with  . Shortness of Breath  . Cough    Roy Lee is a 64 y.o. male who presents for evaluation of shortness of breath.  Patient reports that he was diagnosed with Covid on 08/31/19.  He was admitted in at that time, found to have bilateral PEs.  He was discharged home on 1/30 with oxygen requirement.  He was seen again on 2/6 with increased shortness of breath that they felt like was due to fluid overload.  He states that he had been doing better and been taking his Eliquis and Lasix and states he has not missed any doses.  He states this morning, when he woke up, he noticed that his pulse ox was 92 on 3 L which he states is slightly lower for him.  He states he walked from the bed to the bathroom and found his pulse ox to be 65% on 4 L.  He states that he felt acutely short of breath and was having difficulty getting a deep breath in.  He states that his O2 sat remained in the 60s-70s on 4 L for about 30 minutes.  He finally bumped up his oxygen requirement to 6 L and was able to get into the high 80s-90s but states he continued to have shortness of breath.  He states he has had to maintain 6 L to get comfortable.  He states he has continued to have a cough that is productive of clear phlegm.  He reports chest tightness but no chest pain.  He states he has swelling in his legs but states that it is no worse than baseline.  He has not had any fevers and denies any abdominal pain, nausea/vomiting.  The history is provided by the patient.       Past Medical History:  Diagnosis Date  . Arthritis   . Hypertension   . Obesity     Patient Active Problem List   Diagnosis Date Noted  . COVID-19 virus infection 10-10-2019  . Pneumonia due to COVID-19 virus 09/03/2019    History reviewed. No pertinent surgical  history.     History reviewed. No pertinent family history.  Social History   Tobacco Use  . Smoking status: Never Smoker  . Smokeless tobacco: Never Used  Substance Use Topics  . Alcohol use: Yes    Alcohol/week: 0.0 standard drinks    Comment: very little  . Drug use: No    Home Medications Prior to Admission medications   Medication Sig Start Date End Date Taking? Authorizing Provider  apixaban (ELIQUIS) 5 MG TABS tablet Please take 10 mg oral twice daily for next 5 days, then transition to 5 mg oral twice daily on 09/14/2019 Patient taking differently: Take 5 mg by mouth 2 (two) times daily.  09/09/19  Yes Elgergawy, Leana Roe, MD  dextromethorphan-guaiFENesin (MUCINEX DM) 30-600 MG 12hr tablet Take 1 tablet by mouth 2 (two) times daily as needed for cough.   Yes [provider]  furosemide (LASIX) 20 MG tablet Take 1 tablet (20 mg total) by mouth daily. 09/16/19  Yes Wynetta Fines, MD  ibuprofen (ADVIL) 200 MG tablet Take 600-800 mg by mouth every 6 (six) hours as needed for fever, headache or moderate pain.   Yes [provider]  latanoprost (XALATAN) 0.005 % ophthalmic solution Place 1 drop  into both eyes daily. 07/31/19  Yes [provider]  lisinopril (ZESTRIL) 40 MG tablet Take 40 mg by mouth daily.   Yes [provider]  Multiple Vitamin (MULTIVITAMIN WITH MINERALS) TABS tablet Take 1 tablet by mouth daily.   Yes [provider]  vitamin C (ASCORBIC ACID) 500 MG tablet Take 500 mg by mouth daily.   Yes [provider]  acetaminophen (TYLENOL) 325 MG tablet Take 2 tablets (650 mg total) by mouth every 6 (six) hours as needed for mild pain or headache (fever >/= 101). Patient not taking: Reported on Oct 11, 2019 09/09/19   Elgergawy, Leana Roe, MD  dexamethasone (DECADRON) 4 MG tablet Take 2 tablets (8 mg total) by mouth daily. Patient not taking: Reported on 10/11/19 09/10/19   Elgergawy, Leana Roe, MD  pantoprazole (PROTONIX)  40 MG tablet Take 1 tablet (40 mg total) by mouth daily. Patient not taking: Reported on 10/11/19 09/09/19 10/09/19  Elgergawy, Leana Roe, MD    Allergies    Patient has no known allergies.  Review of Systems   Review of Systems  Constitutional: Negative for fever.  Respiratory: Positive for cough, chest tightness and shortness of breath.   Cardiovascular: Positive for leg swelling (Baseline). Negative for chest pain.  Gastrointestinal: Negative for abdominal pain, nausea and vomiting.  Genitourinary: Negative for dysuria and hematuria.  Neurological: Negative for headaches.  All other systems reviewed and are negative.   Physical Exam Updated Vital Signs BP (!) 128/51   Pulse 89   Temp 98.7 F (37.1 C) (Oral)   Resp 20   Ht 5\' 11"  (1.803 m)   Wt 117.9 kg   SpO2 100%   BMI 36.25 kg/m   Physical Exam Vitals and nursing note reviewed.  Constitutional:      Comments: Appears uncomfortable  HENT:     Head: Normocephalic and atraumatic.  Eyes:     General: Lids are normal.     Conjunctiva/sclera: Conjunctivae normal.     Pupils: Pupils are equal, round, and reactive to light.  Cardiovascular:     Rate and Rhythm: Regular rhythm. Tachycardia present.     Pulses: Normal pulses.          Radial pulses are 2+ on the right side and 2+ on the left side.       Dorsalis pedis pulses are 2+ on the right side and 2+ on the left side.     Heart sounds: Normal heart sounds. No murmur. No friction rub. No gallop.   Pulmonary:     Effort: Tachypnea present.     Comments: Tachypnea and increased work of breathing noted.  He is speaking in very short sentences and appears winded when speaking in medium sentences.  Crackles noted at bilateral bases. Abdominal:     Palpations: Abdomen is soft. Abdomen is not rigid.     Tenderness: There is no abdominal tenderness. There is no guarding.  Musculoskeletal:        General: Normal range of motion.     Cervical back: Full passive range of  motion without pain.     Comments: 1+ pitting edema noted bilateral lower extremities.  Skin:    General: Skin is warm and dry.     Capillary Refill: Capillary refill takes less than 2 seconds.  Neurological:     Mental Status: He is alert and oriented to person, place, and time.  Psychiatric:        Speech: Speech normal.     ED  Results / Procedures / Treatments   Labs (all labs ordered are listed, but only abnormal results are displayed) Labs Reviewed  COMPREHENSIVE METABOLIC PANEL - Abnormal; Notable for the following components:      Result Value   Glucose, Bld 126 (*)    Calcium 8.5 (*)    Total Bilirubin 1.5 (*)    All other components within normal limits  CBC WITH DIFFERENTIAL/PLATELET - Abnormal; Notable for the following components:   WBC 10.9 (*)    RDW 15.6 (*)    Neutro Abs 9.8 (*)    Lymphs Abs 0.6 (*)    All other components within normal limits  BLOOD GAS, VENOUS - Abnormal; Notable for the following components:   pO2, Ven <31.0 (*)    All other components within normal limits  LACTIC ACID, PLASMA - Abnormal; Notable for the following components:   Lactic Acid, Venous 2.3 (*)    All other components within normal limits  LACTIC ACID, PLASMA - Abnormal; Notable for the following components:   Lactic Acid, Venous 2.7 (*)    All other components within normal limits  CULTURE, BLOOD (ROUTINE X 2)  CULTURE, BLOOD (ROUTINE X 2)  BRAIN NATRIURETIC PEPTIDE  PROCALCITONIN  PROCALCITONIN  LACTIC ACID, PLASMA  C-REACTIVE PROTEIN  D-DIMER, QUANTITATIVE (NOT AT [facility])  FERRITIN    EKG None  Radiology CT Angio Chest PE W and/or Wo Contrast  Result Date: 2019/10/10 CLINICAL DATA:  Shortness of breath, COVID-19 08/31/2019, hypoxia EXAM: CT ANGIOGRAPHY CHEST WITH CONTRAST TECHNIQUE: Multidetector CT imaging of the chest was performed using the standard protocol during bolus administration of intravenous contrast. Multiplanar CT image reconstructions and MIPs were  obtained to evaluate the vascular anatomy. CONTRAST:  OMNIPAQUE IOHEXOL 350 MG/ML SOLN COMPARISON:  09/16/2019 FINDINGS: Cardiovascular: This is a technically adequate evaluation of the pulmonary vasculature. No filling defects or pulmonary emboli. Heart is unremarkable without pericardial effusion. Thoracic aorta is normal in caliber. Mediastinum/Nodes: Borderline enlarged lymph nodes within the mediastinum are stable, likely reactive. The largest lymph node in the pretracheal region measures 14 mm in short axis reference image 47. Borderline enlarged bilateral hilar lymph nodes are unchanged. Lungs/Pleura: Since the previous exam, there are increasing areas of bilateral multifocal airspace disease consistent with progressive multifocal pneumonia. No effusion or pneumothorax. The central airways are patent. Upper Abdomen: No acute abnormality. Musculoskeletal: No acute or destructive bony lesions. Reconstructed images demonstrate no additional findings. Review of the MIP images confirms the above findings. IMPRESSION: 1. No evidence of pulmonary embolus. 2. Worsening bilateral airspace disease consistent with progressive multifocal pneumonia. Findings are consistent with known COVID-19. 3. Reactive mediastinal adenopathy. Electronically Signed   By: Sharlet Salina M.D.   On: 10-10-2019 16:13   DG Chest Portable 1 View  Result Date: 10-10-2019 CLINICAL DATA:  Shortness of breath. COVID-19 pneumonia. Increased cough. Decreased O2 saturation. EXAM: PORTABLE CHEST 1 VIEW COMPARISON:  CT scan and chest x-ray dated 09/16/2019 and chest x-ray dated 09/03/2019 FINDINGS: There is been slight progression of the peripheral infiltrate in the left upper lobe. The infiltrates on the right and the infiltrates at the left base are unchanged. Heart size and pulmonary vascularity remain normal. No discrete effusions. IMPRESSION: Persistent bilateral pulmonary infiltrates, progressed in the left upper lobe. Electronically  Signed   By: Francene Boyers M.D.   On: 10/10/19 12:44    Procedures .Critical Care Performed by: Maxwell Caul, PA-C Authorized by: Maxwell Caul, PA-C   Critical care provider statement:  Critical care time (minutes):  35   Critical care was necessary to treat or prevent imminent or life-threatening deterioration of the following conditions:  Respiratory failure   Critical care was time spent personally by me on the following activities:  Discussions with consultants, evaluation of patient's response to treatment, examination of patient, ordering and performing treatments and interventions, ordering and review of laboratory studies, ordering and review of radiographic studies, pulse oximetry, re-evaluation of patient's condition, obtaining history from patient or surrogate and review of old charts   (including critical care time)  Medications Ordered in ED Medications  sodium chloride (PF) 0.9 % injection (has no administration in time range)  cefTRIAXone (ROCEPHIN) 2 g in sodium chloride 0.9 % 100 mL IVPB (has no administration in time range)  azithromycin (ZITHROMAX) 500 mg in sodium chloride 0.9 % 250 mL IVPB (has no administration in time range)  albuterol (VENTOLIN HFA) 108 (90 Base) MCG/ACT inhaler 4 puff (4 puffs Inhalation Given 09/25/2019 1445)  iohexol (OMNIPAQUE) 350 MG/ML injection 100 mL (100 mLs Intravenous Contrast Given 10/08/2019 1555)  sodium chloride 0.9 % bolus 1,000 mL (1,000 mLs Intravenous New Bag/Given (Non-Interop) 09/29/2019 1731)  dexamethasone (DECADRON) injection 10 mg (10 mg Intravenous Given 09/17/2019 1731)    ED Course  I have reviewed the triage vital signs and the nursing notes.  Pertinent labs & imaging results that were available during my care of the patient were reviewed by me and considered in my medical decision making (see chart for details).    MDM Rules/Calculators/A&P                      64 year old male who presents for evaluation of  shortness of breath.  Recent Covid positive and was admitted for respiratory failure.  Discharged home on 3 L O2 which she has been at on baseline.  Today, felt acutely more shortness of breath and noted that his O2 sats were in the low 90s on 3 L.  When he walked from the bedroom to the bathroom, he dropped to about 65% and stayed there despite increasing his oxygen to 4 L.  He had to increase his oxygen to 6 L which finally improved but was still having significant symptoms, prompting ED visit.  On initial ED arrival, he is satting between 90-93 on room air if he sits perfectly still.  He is tachycardic and tachypneic and is speaking in very short sentences.  Obvious work of breathing noted.  I turned down his oxygen and noted him to desat into the 85% on 5 L while he was talking.  Patient was bumped up to 6 L and was able to maintain O2 sats between 92-93.  He does have history of bilateral PEs and states he has been compliant with his Eliquis.  On exam, he has crackles noted at bilateral bases.  Question if this is related to his previous COVID-19 infection versus CHF exacerbation.  Plan to check labs, imaging.  He was admitted on 09/03/19.  During that time, he presented with shortness of breath and was satting in the 70s.  He had improved oxygen supplementation with 2 L of oxygen.  He also got remdesivir infusions.  During his course, he was found to have bilateral PEs.  He was started on Eliquis.  He was also discharged home with oxygen.  He had some improvement and was discharged on 09/09/2019.  He returned to the ED on 09/16/2019 for evaluation of shortness of breath.  He had been holding his Lasix and and his progressive shortness of breath was thought to be due to fluid retention.  He was advised to start taking his Lasix again.  Lactic is elevated 2.3.  BNP is 44.4.  VBG shows pH of 7.3.  PO2 is less than 31.0.  CMP shows slight leukocytosis of 10.9.  CMP is unremarkable.  At this time, given his  significantly shortness of breath as well as his increased oxygen requirement, with unclear etiology, will plan for CTA of chest to ensure that he does have recurrence of PEs.  He will likely need admission given his increased oxygen requirement.  CT shows no evidence of PE.  He has worsening bilateral airspace disease consistent with progressive multifocal pneumonia.  Findings are consistent with known COVID-19.  I updated patient on results.  Given that he is requiring an increased oxygen requirement and is hypoxic, feel that admission is warranted.  We will plan for admission.  Discussed patient with hospitalist. Accepts patient for admission.   Roy Lee was evaluated in Emergency Department on Sep 27, 2019 for the symptoms described in the history of present illness. He was evaluated in the context of the global COVID-19 pandemic, which necessitated consideration that the patient might be at risk for infection with the SARS-CoV-2 virus that causes COVID-19. Institutional protocols and algorithms that pertain to the evaluation of patients at risk for COVID-19 are in a state of rapid change based on information released by regulatory bodies including the CDC and federal and state organizations. These policies and algorithms were followed during the patient's care in the ED.  Portions of this note were generated with [employer]. Dictation errors may occur despite best attempts at proofreading.  Final Clinical Impression(s) / ED Diagnoses Final diagnoses:  Hypoxia  Shortness of breath  Pneumonia due to COVID-19 virus    Rx / DC Orders ED Discharge Orders    None       Volanda Napoleon, PA-C 09-27-2019 1815    Sherwood Gambler, MD 09/23/19 [phone]

## 2019-09-22 NOTE — ED Notes (Signed)
Pt transported to CT ?

## 2019-09-22 NOTE — ED Notes (Signed)
Failed attempt to collect labs. RN has been notified.  

## 2019-09-23 DIAGNOSIS — I10 Essential (primary) hypertension: Secondary | ICD-10-CM

## 2019-09-23 DIAGNOSIS — I2699 Other pulmonary embolism without acute cor pulmonale: Secondary | ICD-10-CM

## 2019-09-23 DIAGNOSIS — J189 Pneumonia, unspecified organism: Secondary | ICD-10-CM

## 2019-09-23 DIAGNOSIS — J1282 Pneumonia due to coronavirus disease 2019: Secondary | ICD-10-CM

## 2019-09-23 DIAGNOSIS — E66813 Obesity, class 3: Secondary | ICD-10-CM

## 2019-09-23 DIAGNOSIS — J9601 Acute respiratory failure with hypoxia: Secondary | ICD-10-CM

## 2019-09-23 LAB — CBC WITH DIFFERENTIAL/PLATELET
Abs Immature Granulocytes: 0.04 10*3/uL (ref 0.00–0.07)
Basophils Absolute: 0 10*3/uL (ref 0.0–0.1)
Basophils Relative: 0 %
Eosinophils Absolute: 0 10*3/uL (ref 0.0–0.5)
Eosinophils Relative: 0 %
HCT: 42.3 % (ref 39.0–52.0)
Hemoglobin: 14.1 g/dL (ref 13.0–17.0)
Immature Granulocytes: 1 %
Lymphocytes Relative: 4 %
Lymphs Abs: 0.3 10*3/uL — ABNORMAL LOW (ref 0.7–4.0)
MCH: 29.7 pg (ref 26.0–34.0)
MCHC: 33.3 g/dL (ref 30.0–36.0)
MCV: 89.1 fL (ref 80.0–100.0)
Monocytes Absolute: 0.1 10*3/uL (ref 0.1–1.0)
Monocytes Relative: 1 %
Neutro Abs: 7.8 10*3/uL — ABNORMAL HIGH (ref 1.7–7.7)
Neutrophils Relative %: 94 %
Platelets: 127 10*3/uL — ABNORMAL LOW (ref 150–400)
RBC: 4.75 MIL/uL (ref 4.22–5.81)
RDW: 15.4 % (ref 11.5–15.5)
WBC: 8.2 10*3/uL (ref 4.0–10.5)
nRBC: 0 % (ref 0.0–0.2)

## 2019-09-23 LAB — COMPREHENSIVE METABOLIC PANEL
ALT: 25 U/L (ref 0–44)
AST: 23 U/L (ref 15–41)
Albumin: 3.2 g/dL — ABNORMAL LOW (ref 3.5–5.0)
Alkaline Phosphatase: 48 U/L (ref 38–126)
Anion gap: 8 (ref 5–15)
BUN: 15 mg/dL (ref 8–23)
CO2: 24 mmol/L (ref 22–32)
Calcium: 8.2 mg/dL — ABNORMAL LOW (ref 8.9–10.3)
Chloride: 105 mmol/L (ref 98–111)
Creatinine, Ser: 0.72 mg/dL (ref 0.61–1.24)
GFR calc Af Amer: >60 mL/min (ref >60)
GFR calc non Af Amer: >60 mL/min (ref >60)
Glucose, Bld: 168 mg/dL — ABNORMAL HIGH (ref 70–99)
Potassium: 4.8 mmol/L (ref 3.5–5.1)
Sodium: 137 mmol/L (ref 135–145)
Total Bilirubin: 0.5 mg/dL (ref 0.3–1.2)
Total Protein: 6 g/dL — ABNORMAL LOW (ref 6.5–8.1)

## 2019-09-23 LAB — PHOSPHORUS: Phosphorus: 2.9 mg/dL (ref 2.5–4.6)

## 2019-09-23 LAB — BLOOD GAS, VENOUS
Acid-Base Excess: 0.4 mmol/L (ref 0.0–2.0)
Bicarbonate: 26.2 mmol/L (ref 20.0–28.0)
O2 Saturation: 48.3 %
Patient temperature: 98.6
pCO2, Ven: 47.9 mmHg (ref 44.0–60.0)
pH, Ven: 7.357 (ref 7.250–7.430)
pO2, Ven: <31 mmHg — CL (ref 32.0–45.0)

## 2019-09-23 LAB — GLUCOSE, CAPILLARY
Glucose-Capillary: 168 mg/dL — ABNORMAL HIGH (ref 70–99)
Glucose-Capillary: 148 mg/dL — ABNORMAL HIGH (ref 70–99)
Glucose-Capillary: 175 mg/dL — ABNORMAL HIGH (ref 70–99)
Glucose-Capillary: 220 mg/dL — ABNORMAL HIGH (ref 70–99)

## 2019-09-23 LAB — D-DIMER, QUANTITATIVE: D-Dimer, Quant: 3.1 ug/mL-FEU — ABNORMAL HIGH (ref 0.00–0.50)

## 2019-09-23 LAB — FERRITIN: Ferritin: 241 ng/mL (ref 24–336)

## 2019-09-23 LAB — HIV ANTIBODY (ROUTINE TESTING W REFLEX): HIV Screen 4th Generation wRfx: NONREACTIVE

## 2019-09-23 LAB — MAGNESIUM: Magnesium: 2.1 mg/dL (ref 1.7–2.4)

## 2019-09-23 LAB — C-REACTIVE PROTEIN: CRP: 9.7 mg/dL — ABNORMAL HIGH (ref <1.0)

## 2019-09-23 MED ORDER — SALINE SPRAY 0.65 % NA SOLN
1.0000 | NASAL | Status: DC | PRN
  Administered 2019-09-25: 1 via NASAL
  Filled 2019-09-23: qty 44

## 2019-09-23 MED ORDER — FUROSEMIDE 40 MG PO TABS
40.0000 mg | ORAL_TABLET | Freq: Every day | ORAL | Status: DC
  Administered 2019-09-23 – 2019-09-25 (×3): 40 mg via ORAL
  Filled 2019-09-23 (×5): qty 2

## 2019-09-23 NOTE — Plan of Care (Signed)
  Problem: Education: Goal: Knowledge of risk factors and measures for prevention of condition will improve Outcome: Progressing   Problem: Coping: Goal: Psychosocial and spiritual needs will be supported Outcome: Progressing   Problem: Respiratory: Goal: Will maintain a patent airway Outcome: Progressing Goal: Complications related to the disease process, condition or treatment will be avoided or minimized Outcome: Progressing   Problem: Education: Goal: Knowledge of General Education information will improve Description Including pain rating scale, medication(s)/side effects and non-pharmacologic comfort measures Outcome: Progressing   Problem: Health Behavior/Discharge Planning: Goal: Ability to manage health-related needs will improve Outcome: Progressing   Problem: Clinical Measurements: Goal: Ability to maintain clinical measurements within normal limits will improve Outcome: Progressing Goal: Will remain free from infection Outcome: Progressing Goal: Diagnostic test results will improve Outcome: Progressing Goal: Respiratory complications will improve Outcome: Progressing Goal: Cardiovascular complication will be avoided Outcome: Progressing   Problem: Nutrition: Goal: Adequate nutrition will be maintained Outcome: Progressing   Problem: Coping: Goal: Level of anxiety will decrease Outcome: Progressing   Problem: Elimination: Goal: Will not experience complications related to bowel motility Outcome: Progressing Goal: Will not experience complications related to urinary retention Outcome: Progressing   Problem: Pain Managment: Goal: General experience of comfort will improve Outcome: Progressing   Problem: Safety: Goal: Ability to remain free from injury will improve Outcome: Progressing   Problem: Skin Integrity: Goal: Risk for impaired skin integrity will decrease Outcome: Progressing   

## 2019-09-23 NOTE — Evaluation (Addendum)
Clinical/Bedside Swallow Evaluation Patient Details  Name: Roy Lee MRN: [id number] Date of Birth: 13-Aug-1955  Today's Date: 09/23/2019 Time: SLP Start Time (ACUTE ONLY): 1553 SLP Stop Time (ACUTE ONLY): 1607 SLP Time Calculation (min) (ACUTE ONLY): 14 min  Past Medical History:  Past Medical History:  Diagnosis Date  . Arthritis   . Hypertension   . Obesity    Past Surgical History: History reviewed. No pertinent surgical history. HPI:  Roy Lee is a 64 y.o. male with medical history significant of  HTN, obesity, pulmonary embolism on eliquis who presents with worsening shortness of breath.  He was recently discharged from [facility] after treatment for COVID pneumonia.  He received steroids, remdesivir, and actemra and was discharged on 3 L with 5 days of dexamethasone to complete.  He notes he had about 1 good week at home, then since then, he's had progressive shortness of breath.  No previously documented dysphagia hx.    Assessment / Plan / Recommendation Clinical Impression  Pt was seen for a bedside swallow evaluation and he presents with what appears to be functional oropharyngeal swallowing abilities.  Pt denied hx of dysphagia and stated that he was tolerating his current diet of regular solids and thin liquids without difficulty.  Observed a baseline dry cough prior to PO trials.  Pt consumed trials of thin liquid and regular solids.  He exhibited timely mastication and AP transport with all trials and consistent hyolaryngeal elevation/excursion was observed.  No clinical s/sx of aspiration were noted with any trials despite challenging.  Recommend continuation of regular solids and thin liquids with medication administered whole with thin liquid.  No further skilled ST is warranted at this time.  Please re-consult if additional needs arise.    SLP Visit Diagnosis: Dysphagia, unspecified (R13.10)    Aspiration Risk  No limitations    Diet Recommendation  Regular;Thin liquid   Liquid Administration via: Cup;Straw Medication Administration: Whole meds with liquid Supervision: Patient able to self feed    Other  Recommendations Oral Care Recommendations: Oral care BID   Follow up Recommendations None        Swallow Study   General HPI: Roy Lee is a 64 y.o. male with medical history significant of  HTN, obesity, pulmonary embolism on eliquis who presents with worsening shortness of breath.  He was recently discharged from [facility] after treatment for COVID pneumonia.  He received steroids, remdesivir, and actemra and was discharged on 3 L with 5 days of dexamethasone to complete.  He notes he had about 1 good week at home, then since then, he's had progressive shortness of breath.  No previously documented dysphagia hx.  Type of Study: Bedside Swallow Evaluation Previous Swallow Assessment: None  Diet Prior to this Study: Regular;Thin liquids Temperature Spikes Noted: No Respiratory Status: Nasal cannula History of Recent Intubation: No Behavior/Cognition: Alert;Cooperative;Pleasant mood Oral Cavity Assessment: Within Functional Limits Oral Care Completed by SLP: No Oral Cavity - Dentition: Adequate natural dentition Vision: Functional for self-feeding Self-Feeding Abilities: Able to feed self Patient Positioning: Upright in bed Baseline Vocal Quality: Normal Volitional Cough: Strong Volitional Swallow: Able to elicit    Oral/Motor/Sensory Function Overall Oral Motor/Sensory Function: Within functional limits   Ice Chips Ice chips: Not tested   Thin Liquid Thin Liquid: Within functional limits    Nectar Thick Nectar Thick Liquid: Not tested   Honey Thick Honey Thick Liquid: Not tested   Puree Puree: Not tested   Solid  Solid: Within functional limits     Colin Mulders., M.S., [place] Office: [phone]  Elvia Collum [address] 09/23/2019,4:13 PM

## 2019-09-23 NOTE — Progress Notes (Signed)
[name]    Progress Note  [name]  FUX:[id number] DOB: 1956-07-08 DOA: 09/30/2019 PCP: [name], PA-C     Brief Narrative:   Roy Lee is an 64 y.o. male past medical history significant of essential hypertension, obesity PE on Eliquis recently discharged from [facility] for COVID-19 pneumonia during admission he received steroids remdesivir and Actemra and was discharged on 3 to 5 L of oxygen to complete his 10-day course of dexamethasone as an outpatient.  He relates he had about 1 good week.  But over the past for 5 days he has been regressing, went to the ED and was found to be fluid overloaded started on Lasix shortness of breath started on the day of admission.  Assessment/Plan:   Acute respiratory failure with hypoxia secondary to healthcare associated pneumonia and/or acute respiratory distress syndrome due to COVID-19 virus: Mild leukocytosis with worsening shortness of breath and a low yield procalcitonin hard to differentiate, he did have elevated lactic acid on admission. Continue IV remdesivir steroids, Rocephin and azithromycin his leukocytosis is improved. His inflammatory markers continue to be significantly I believe that his primary of bacterial infection.  His white count did improve after initiation of antibiotics. I personally reviewed his CT angio on 10/04/2019 that showed worsening bilateral infiltrates, no PE. Try to keep the patient prone for at least 16 hours a day, if not prone out of bed to chair, continue incentive spirometry and flutter valve. Is now requiring 6 L, follow-up inflammatory markers and blood cultures.  We will have to be judicious with IV fluids, restrict his oral fluid intake.  Elevated lactic acid: Likely due to hypoxia patient is currently not septic.  Bilateral PE: Continue Eliquis.  Essential hypertension: Hold lisinopril continue Lasix strict I's and O's and daily  weights.  Obesity, Class III, BMI 40-49.9 (morbid obesity) (HCC) Noted    DVT prophylaxis: lovenox Family Communication:none Disposition Plan/Barrier to D/C:   Code Status:     Code Status Orders  (From admission, onward)         Start     Ordered   09/29/2019 2301  Full code  Continuous     09/16/2019 2301        Code Status History    Date Active Date Inactive Code Status Order ID Comments User Context   09/04/2019 0637 09/09/2019 2201 Full Code [id number]  [name], MD Inpatient   Advance Care Planning Activity        IV Access:    Peripheral IV   Procedures and diagnostic studies:   CT Angio Chest PE W and/or Wo Contrast  Result Date: 09/21/2019 CLINICAL DATA:  Shortness of breath, COVID-19 08/31/2019, hypoxia EXAM: CT ANGIOGRAPHY CHEST WITH CONTRAST TECHNIQUE: Multidetector CT imaging of the chest was performed using the standard protocol during bolus administration of intravenous contrast. Multiplanar CT image reconstructions and MIPs were obtained to evaluate the vascular anatomy. CONTRAST:  OMNIPAQUE IOHEXOL 350 MG/ML SOLN COMPARISON:  09/16/2019 FINDINGS: Cardiovascular: This is a technically adequate evaluation of the pulmonary vasculature. No filling defects or pulmonary emboli. Heart is unremarkable without pericardial effusion. Thoracic aorta is normal in caliber. Mediastinum/Nodes: Borderline enlarged lymph nodes within the mediastinum are stable, likely reactive. The largest lymph node in the pretracheal region measures 14 mm in short axis reference image 47. Borderline enlarged bilateral hilar lymph nodes are unchanged. Lungs/Pleura: Since the previous exam, there are increasing areas of bilateral multifocal airspace disease consistent with  progressive multifocal pneumonia. No effusion or pneumothorax. The central airways are patent. Upper Abdomen: No acute abnormality. Musculoskeletal: No acute or destructive bony lesions. Reconstructed images  demonstrate no additional findings. Review of the MIP images confirms the above findings. IMPRESSION: 1. No evidence of pulmonary embolus. 2. Worsening bilateral airspace disease consistent with progressive multifocal pneumonia. Findings are consistent with known COVID-19. 3. Reactive mediastinal adenopathy. Electronically Signed   By: [name] M.D.   On: 09/21/2019 16:13   DG Chest Portable 1 View  Result Date: 09/12/2019 CLINICAL DATA:  Shortness of breath. COVID-19 pneumonia. Increased cough. Decreased O2 saturation. EXAM: PORTABLE CHEST 1 VIEW COMPARISON:  CT scan and chest x-ray dated 09/16/2019 and chest x-ray dated 09/03/2019 FINDINGS: There is been slight progression of the peripheral infiltrate in the left upper lobe. The infiltrates on the right and the infiltrates at the left base are unchanged. Heart size and pulmonary vascularity remain normal. No discrete effusions. IMPRESSION: Persistent bilateral pulmonary infiltrates, progressed in the left upper lobe. Electronically Signed   By: [name] M.D.   On: 09/25/2019 12:44     Medical Consultants:    None.  Anti-Infectives:   IV remdesivir, Rocephin and azithromycin  Subjective:    [name] relates his breathing is better than when he came in.  Objective:    Vitals:   09/23/19 0000 09/23/19 0400 09/23/19 0428 09/23/19 0500  BP: 117/68  111/71   Pulse:   65 66  Resp:  (!) 27 (!) 27 (!) 22  Temp: 98.4 F (36.9 C)  98.1 F (36.7 C)   TempSrc: Oral  Oral   SpO2:   90% 92%  Weight:      Height:       SpO2: 92 % O2 Flow Rate (L/min): 6 L/min   Intake/Output Summary (Last 24 hours) at 09/23/2019 0733 Last data filed at 09/14/2019 1904 Gross per 24 hour  Intake 1100 ml  Output --  Net 1100 ml   Filed Weights   10/01/2019 1155  Weight: 117.9 kg    Exam: General exam: In no acute distress. Respiratory system: Good air movement and diffuse crackles bilaterally Cardiovascular system: S1 & S2  heard, RRR. No JVD. Gastrointestinal system: Abdomen is nondistended, soft and nontender.  Extremities: No pedal edema. Skin: No rashes, lesions or ulcers  Data Reviewed:    Labs: Basic Metabolic Panel: Recent Labs  Lab 09/16/19 1235 09/16/19 1235 10/06/2019 1218 09/23/19 0145  NA 139  --  136 137  K 4.1   < > 4.2 4.8  CL 106  --  101 105  CO2 25  --  24 24  GLUCOSE 97  --  126* 168*  BUN 16  --  15 15  CREATININE 0.96  --  0.84 0.72  CALCIUM 8.3*  --  8.5* 8.2*  MG  --   --   --  2.1  PHOS  --   --   --  2.9   < > = values in this interval not displayed.   GFR Estimated Creatinine Clearance: 126.6 mL/min (by C-G formula based on SCr of 0.72 mg/dL). Liver Function Tests: Recent Labs  Lab 09/16/19 1235 09/15/2019 1218 09/23/19 0145  AST 23 31 23   ALT 36 34 25  ALKPHOS 49 57 48  BILITOT 0.8 1.5* 0.5  PROT 6.0* 7.3 6.0*  ALBUMIN 3.2* 3.7 3.2*   No results for input(s): LIPASE, AMYLASE in the last 168 hours. No results for input(s): AMMONIA  in the last 168 hours. Coagulation profile Recent Labs  Lab 09/16/19 1235  INR 1.1   COVID-19 Labs  Recent Labs    10/08/2019 1220 09/23/19 0145  DDIMER 4.17* 3.10*  FERRITIN 224 241  CRP 6.3* 9.7*    No results found for: SARSCOV2NAA  CBC: Recent Labs  Lab 09/16/19 1235 09/24/2019 1218 09/23/19 0145  WBC 9.8 10.9* 8.2  NEUTROABS 8.0* 9.8* 7.8*  HGB 14.8 16.4 14.1  HCT 43.7 49.2 42.3  MCV 88.5 90.1 89.1  PLT 193 153 127*   Cardiac Enzymes: No results for input(s): CKTOTAL, CKMB, CKMBINDEX, TROPONINI in the last 168 hours. BNP (last 3 results) No results for input(s): PROBNP in the last 8760 hours. CBG: Recent Labs  Lab 09/24/2019 2136 09/21/2019 2312  GLUCAP 149* 177*   D-Dimer: Recent Labs    10/03/2019 1220 09/23/19 0145  DDIMER 4.17* 3.10*   Hgb A1c: No results for input(s): HGBA1C in the last 72 hours. Lipid Profile: No results for input(s): CHOL, HDL, LDLCALC, TRIG, CHOLHDL, LDLDIRECT in the last  72 hours. Thyroid function studies: No results for input(s): TSH, T4TOTAL, T3FREE, THYROIDAB in the last 72 hours.  Invalid input(s): FREET3 Anemia work up: Recent Labs    09/21/2019 1220 09/23/19 0145  FERRITIN 224 241   Sepsis Labs: Recent Labs  Lab 09/16/19 1235 09/30/2019 1218 09/25/2019 1222 09/30/2019 1439 09/17/2019 1641 09/19/2019 1753 09/23/19 0145  PROCALCITON  --   --   --   --  0.15  --   --   WBC 9.8 10.9*  --   --   --   --  8.2  LATICACIDVEN  --   --  2.3* 2.7*  --  1.1  --    Microbiology Recent Results (from the past 240 hour(s))  Culture, blood (routine x 2)     Status: None   Collection Time: 09/16/19 12:30 PM   Specimen: BLOOD LEFT FOREARM  Result Value Ref Range Status   Specimen Description   Final    BLOOD LEFT FOREARM Performed at [facility], [address] [place], [place] [address]    Special Requests   Final    BOTTLES DRAWN AEROBIC AND ANAEROBIC Blood Culture results may not be optimal due to an excessive volume of blood received in culture bottles Performed at [facility], [address] [place], [place] [address]    Culture   Final    NO GROWTH 5 DAYS Performed at [facility], [address] [place], [place] [address]    Report Status 09/21/2019 FINAL  Final  Culture, blood (routine x 2)     Status: None   Collection Time: 09/16/19 12:54 PM   Specimen: BLOOD RIGHT HAND  Result Value Ref Range Status   Specimen Description   Final    BLOOD RIGHT HAND Performed at [facility], [address], [place], [place] [address]    Special Requests   Final    AEB Blood Culture results may not be optimal due to an inadequate volume of blood received in culture bottles Performed at [facility], [address] [place], [place] [address]    Culture   Final    NO GROWTH 5 DAYS Performed at [facility], [address], [place], [place] [address]    Report  Status 09/21/2019 FINAL  Final  Culture, blood (routine x 2)     Status: None (Preliminary result)  Collection Time: Oct 07, 2019  2:39 PM   Specimen: BLOOD RIGHT HAND  Result Value Ref Range Status   Specimen Description   Final    BLOOD RIGHT HAND Performed at [facility], [address], [place], [place] [address]    Special Requests   Final    BOTTLES DRAWN AEROBIC ONLY Blood Culture results may not be optimal due to an inadequate volume of blood received in culture bottles Performed at [facility], [address] [place], [place] [address]    Culture PENDING  Incomplete   Report Status PENDING  Incomplete     Medications:   . apixaban  5 mg Oral BID  . vitamin C  500 mg Oral Daily  . insulin aspart  0-5 Units Subcutaneous QHS  . insulin aspart  0-9 Units Subcutaneous TID WC  . Ipratropium-Albuterol  1 puff Inhalation Q6H  . latanoprost  1 drop Both Eyes Daily  . methylPREDNISolone (SOLU-MEDROL) injection  60 mg Intravenous Q12H  . pantoprazole  40 mg Oral Daily  . zinc sulfate  220 mg Oral Daily   Continuous Infusions: . azithromycin Stopped (10/07/2019 2010)  . cefTRIAXone (ROCEPHIN)  IV Stopped (07-Oct-2019 1904)      LOS: 1 day   Marinda Elk  [facility]  09/23/2019, 7:33 AM

## 2019-09-24 LAB — COMPREHENSIVE METABOLIC PANEL
ALT: 24 U/L (ref 0–44)
AST: 25 U/L (ref 15–41)
Albumin: 2.9 g/dL — ABNORMAL LOW (ref 3.5–5.0)
Alkaline Phosphatase: 64 U/L (ref 38–126)
Anion gap: 6 (ref 5–15)
BUN: 18 mg/dL (ref 8–23)
CO2: 25 mmol/L (ref 22–32)
Calcium: 8.4 mg/dL — ABNORMAL LOW (ref 8.9–10.3)
Chloride: 106 mmol/L (ref 98–111)
Creatinine, Ser: 0.7 mg/dL (ref 0.61–1.24)
GFR calc Af Amer: >60 mL/min (ref >60)
GFR calc non Af Amer: >60 mL/min (ref >60)
Glucose, Bld: 116 mg/dL — ABNORMAL HIGH (ref 70–99)
Potassium: 5 mmol/L (ref 3.5–5.1)
Sodium: 137 mmol/L (ref 135–145)
Total Bilirubin: 0.8 mg/dL (ref 0.3–1.2)
Total Protein: 5.8 g/dL — ABNORMAL LOW (ref 6.5–8.1)

## 2019-09-24 LAB — CBC WITH DIFFERENTIAL/PLATELET
Abs Immature Granulocytes: 0.03 10*3/uL (ref 0.00–0.07)
Basophils Absolute: 0 10*3/uL (ref 0.0–0.1)
Basophils Relative: 0 %
Eosinophils Absolute: 0 10*3/uL (ref 0.0–0.5)
Eosinophils Relative: 0 %
HCT: 39.2 % (ref 39.0–52.0)
Hemoglobin: 13.4 g/dL (ref 13.0–17.0)
Immature Granulocytes: 0 %
Lymphocytes Relative: 6 %
Lymphs Abs: 0.8 10*3/uL (ref 0.7–4.0)
MCH: 30.1 pg (ref 26.0–34.0)
MCHC: 34.2 g/dL (ref 30.0–36.0)
MCV: 88.1 fL (ref 80.0–100.0)
Monocytes Absolute: 0.5 10*3/uL (ref 0.1–1.0)
Monocytes Relative: 4 %
Neutro Abs: 11.4 10*3/uL — ABNORMAL HIGH (ref 1.7–7.7)
Neutrophils Relative %: 90 %
Platelets: 138 10*3/uL — ABNORMAL LOW (ref 150–400)
RBC: 4.45 MIL/uL (ref 4.22–5.81)
RDW: 15.4 % (ref 11.5–15.5)
WBC: 12.8 10*3/uL — ABNORMAL HIGH (ref 4.0–10.5)
nRBC: 0 % (ref 0.0–0.2)

## 2019-09-24 LAB — D-DIMER, QUANTITATIVE: D-Dimer, Quant: 5.04 ug/mL-FEU — ABNORMAL HIGH (ref 0.00–0.50)

## 2019-09-24 LAB — GLUCOSE, CAPILLARY
Glucose-Capillary: 151 mg/dL — ABNORMAL HIGH (ref 70–99)
Glucose-Capillary: 151 mg/dL — ABNORMAL HIGH (ref 70–99)
Glucose-Capillary: 156 mg/dL — ABNORMAL HIGH (ref 70–99)
Glucose-Capillary: 188 mg/dL — ABNORMAL HIGH (ref 70–99)

## 2019-09-24 LAB — MAGNESIUM: Magnesium: 2.1 mg/dL (ref 1.7–2.4)

## 2019-09-24 LAB — C-REACTIVE PROTEIN: CRP: 6.4 mg/dL — ABNORMAL HIGH (ref <1.0)

## 2019-09-24 LAB — EXPECTORATED SPUTUM ASSESSMENT W GRAM STAIN, RFLX TO RESP C

## 2019-09-24 LAB — PHOSPHORUS: Phosphorus: 2.6 mg/dL (ref 2.5–4.6)

## 2019-09-24 LAB — PROCALCITONIN: Procalcitonin: 0.15 ng/mL

## 2019-09-24 LAB — FERRITIN: Ferritin: 241 ng/mL (ref 24–336)

## 2019-09-24 MED ORDER — LOPERAMIDE HCL 2 MG PO CAPS
2.0000 mg | ORAL_CAPSULE | ORAL | Status: AC | PRN
  Administered 2019-09-24 (×2): 2 mg via ORAL
  Filled 2019-09-24 (×2): qty 1

## 2019-09-24 MED ORDER — METHYLPREDNISOLONE SODIUM SUCC 40 MG IJ SOLR
20.0000 mg | Freq: Two times a day (BID) | INTRAMUSCULAR | Status: DC
  Administered 2019-09-24 (×2): 20 mg via INTRAVENOUS
  Filled 2019-09-24 (×2): qty 1

## 2019-09-24 NOTE — Progress Notes (Signed)
[name]    Progress Note  Roy Lee  QIO:[id number] DOB: 23-Apr-1956 DOA: 10/19/2019 PCP: Maude Leriche, PA-C     Brief Narrative:   Roy Lee is an 64 y.o. male past medical history significant of essential hypertension, obesity PE on Eliquis recently discharged from [facility] for COVID-19 pneumonia during admission he received steroids remdesivir and Actemra and was discharged on 3 to 5 L of oxygen to complete his 10-day course of dexamethasone as an outpatient.  He relates he had about 1 good week.  But over the past for 5 days he has been regressing, went to the ED and was found to be fluid overloaded started on Lasix shortness of breath started on the day of admission.  Assessment/Plan:   Acute respiratory failure with hypoxia secondary to healthcare associated pneumonia and/or acute respiratory distress syndrome due to COVID-19 virus: Mild leukocytosis with worsening shortness of breath and a low yield procalcitonin hard to differentiate bacterial versus infectious, he did have elevated lactic acid on admission. CT angio on 13-Oct-2019 showed worsening bilateral infiltrates but no PE. To keep the patient prone for at least 16 hours a day if not prone out of bed to chair.  Encourage incentive spirometry and flutter valve. He is requiring 7 L of oxygen this morning which is up from 5 he was yesterday. Physical therapy evaluation is pending. Continue IV remdesivir, steroids, Rocephin and azithromycin he has remained afebrile. His inflammatory markers are slowly improving.  Has remained afebrile his leukocytosis worsening likely due to steroids.  Elevated lactic acid: Likely due to hypoxia patient is currently not septic.  Bilateral PE: Continue Eliquis.  Essential hypertension: Hold lisinopril continue Lasix strict I's and O's and daily weights.  Obesity, Class III, BMI 40-49.9 (morbid obesity) ([place]) Noted    DVT prophylaxis:  lovenox Family Communication:none Disposition Plan/Barrier to D/C:   Code Status:     Code Status Orders  (From admission, onward)         Start     Ordered   11/07/2019 2301  Full code  Continuous     11/03/2019 2301        Code Status History    Date Active Date Inactive Code Status Order ID Comments User Context   09/04/2019 0637 09/09/2019 2201 Full Code [id number]  Artist Beach, MD Inpatient   Advance Care Planning Activity        IV Access:    Peripheral IV   Procedures and diagnostic studies:   CT Angio Chest PE W and/or Wo Contrast  Result Date: 11/02/2019 CLINICAL DATA:  Shortness of breath, COVID-19 08/31/2019, hypoxia EXAM: CT ANGIOGRAPHY CHEST WITH CONTRAST TECHNIQUE: Multidetector CT imaging of the chest was performed using the standard protocol during bolus administration of intravenous contrast. Multiplanar CT image reconstructions and MIPs were obtained to evaluate the vascular anatomy. CONTRAST:  19mL OMNIPAQUE IOHEXOL 350 MG/ML SOLN COMPARISON:  09/16/2019 FINDINGS: Cardiovascular: This is a technically adequate evaluation of the pulmonary vasculature. No filling defects or pulmonary emboli. Heart is unremarkable without pericardial effusion. Thoracic aorta is normal in caliber. Mediastinum/Nodes: Borderline enlarged lymph nodes within the mediastinum are stable, likely reactive. The largest lymph node in the pretracheal region measures 14 mm in short axis reference image 47. Borderline enlarged bilateral hilar lymph nodes are unchanged. Lungs/Pleura: Since the previous exam, there are increasing areas of bilateral multifocal airspace disease consistent with progressive multifocal pneumonia. No effusion or pneumothorax. The central airways are patent. Upper  Abdomen: No acute abnormality. Musculoskeletal: No acute or destructive bony lesions. Reconstructed images demonstrate no additional findings. Review of the MIP images confirms the above findings.  IMPRESSION: 1. No evidence of pulmonary embolus. 2. Worsening bilateral airspace disease consistent with progressive multifocal pneumonia. Findings are consistent with known COVID-19. 3. Reactive mediastinal adenopathy. Electronically Signed   By: Sharlet Salina M.D.   On: 10/06/2019 16:13   DG Chest Portable 1 View  Result Date: 09/23/2019 CLINICAL DATA:  Shortness of breath. COVID-19 pneumonia. Increased cough. Decreased O2 saturation. EXAM: PORTABLE CHEST 1 VIEW COMPARISON:  CT scan and chest x-ray dated 09/16/2019 and chest x-ray dated 09/03/2019 FINDINGS: There is been slight progression of the peripheral infiltrate in the left upper lobe. The infiltrates on the right and the infiltrates at the left base are unchanged. Heart size and pulmonary vascularity remain normal. No discrete effusions. IMPRESSION: Persistent bilateral pulmonary infiltrates, progressed in the left upper lobe. Electronically Signed   By: Francene Boyers M.D.   On: 09/18/2019 12:44     Medical Consultants:    None.  Anti-Infectives:   IV remdesivir, Rocephin and azithromycin  Subjective:    Roy Lee relates he had a bad night yesterday due to etiology bells and ringing's.  Objective:    Vitals:   09/23/19 2020 09/23/19 2355 09/24/19 0355 09/24/19 0430  BP: 139/83 114/73  135/89  Pulse:  78  76  Resp: (!) 23 (!) 21 (!) 22 (!) 21  Temp: 98.7 F (37.1 C) 98.5 F (36.9 C) 98.6 F (37 C) 98.7 F (37.1 C)  TempSrc: Oral Oral Oral Oral  SpO2:  93%  95%  Weight:      Height:       SpO2: 95 % O2 Flow Rate (L/min): 5 L/min   Intake/Output Summary (Last 24 hours) at 09/24/2019 0747 Last data filed at 09/23/2019 1823 Gross per 24 hour  Intake 350 ml  Output -  Net 350 ml   Filed Weights   10/06/2019 1155  Weight: 117.9 kg    Exam: General exam: In no acute distress. Respiratory system: Good air movement and diffuse crackles bilaterally. Cardiovascular system: S1 & S2 heard, RRR. No JVD.  Gastrointestinal system: Abdomen is nondistended, soft and nontender.  Extremities: No pedal edema. Skin: No rashes, lesions or ulcers  Data Reviewed:    Labs: Basic Metabolic Panel: Recent Labs  Lab 10/01/2019 1218 09/21/2019 1218 09/23/19 0145 09/24/19 0140  NA 136  --  137 137  K 4.2   < > 4.8 5.0  CL 101  --  105 106  CO2 24  --  24 25  GLUCOSE 126*  --  168* 116*  BUN 15  --  15 18  CREATININE 0.84  --  0.72 0.70  CALCIUM 8.5*  --  8.2* 8.4*  MG  --   --  2.1 2.1  PHOS  --   --  2.9 2.6   < > = values in this interval not displayed.   GFR Estimated Creatinine Clearance: 126.6 mL/min (by C-G formula based on SCr of 0.7 mg/dL). Liver Function Tests: Recent Labs  Lab 10/05/2019 1218 09/23/19 0145 09/24/19 0140  AST 31 23 25   ALT 34 25 24  ALKPHOS 57 48 64  BILITOT 1.5* 0.5 0.8  PROT 7.3 6.0* 5.8*  ALBUMIN 3.7 3.2* 2.9*   No results for input(s): LIPASE, AMYLASE in the last 168 hours. No results for input(s): AMMONIA in the last 168 hours. Coagulation profile  No results for input(s): INR, PROTIME in the last 168 hours. COVID-19 Labs  Recent Labs    09/29/2019 1220 09/23/19 0145 09/24/19 0140  DDIMER 4.17* 3.10* 5.04*  FERRITIN 224 241 241  CRP 6.3* 9.7* 6.4*    No results found for: SARSCOV2NAA  CBC: Recent Labs  Lab 10/05/2019 1218 09/23/19 0145 09/24/19 0140  WBC 10.9* 8.2 12.8*  NEUTROABS 9.8* 7.8* 11.4*  HGB 16.4 14.1 13.4  HCT 49.2 42.3 39.2  MCV 90.1 89.1 88.1  PLT 153 127* 138*   Cardiac Enzymes: No results for input(s): CKTOTAL, CKMB, CKMBINDEX, TROPONINI in the last 168 hours. BNP (last 3 results) No results for input(s): PROBNP in the last 8760 hours. CBG: Recent Labs  Lab 09/20/2019 2312 09/23/19 0803 09/23/19 1146 09/23/19 1630 09/23/19 2034  GLUCAP 177* 168* 148* 175* 220*   D-Dimer: Recent Labs    09/23/19 0145 09/24/19 0140  DDIMER 3.10* 5.04*   Hgb A1c: No results for input(s): HGBA1C in the last 72 hours. Lipid  Profile: No results for input(s): CHOL, HDL, LDLCALC, TRIG, CHOLHDL, LDLDIRECT in the last 72 hours. Thyroid function studies: No results for input(s): TSH, T4TOTAL, T3FREE, THYROIDAB in the last 72 hours.  Invalid input(s): FREET3 Anemia work up: Recent Labs    09/23/19 0145 09/24/19 0140  FERRITIN 241 241   Sepsis Labs: Recent Labs  Lab 09/19/2019 1218 10/08/2019 1222 09/17/2019 1439 10/04/2019 1641 09/19/2019 1753 09/23/19 0145 09/24/19 0140  PROCALCITON  --   --   --  0.15  --   --  0.15  WBC 10.9*  --   --   --   --  8.2 12.8*  LATICACIDVEN  --  2.3* 2.7*  --  1.1  --   --    Microbiology Recent Results (from the past 240 hour(s))  Culture, blood (routine x 2)     Status: None   Collection Time: 09/16/19 12:30 PM   Specimen: BLOOD LEFT FOREARM  Result Value Ref Range Status   Specimen Description   Final    BLOOD LEFT FOREARM Performed at [facility], [address], [place], [place] [address]    Special Requests   Final    BOTTLES DRAWN AEROBIC AND ANAEROBIC Blood Culture results may not be optimal due to an excessive volume of blood received in culture bottles Performed at [facility], [address] [place], [place] [address]    Culture   Final    NO GROWTH 5 DAYS Performed at [facility], [address], [place], [place] [address]    Report Status 09/21/2019 FINAL  Final  Culture, blood (routine x 2)     Status: None   Collection Time: 09/16/19 12:54 PM   Specimen: BLOOD RIGHT HAND  Result Value Ref Range Status   Specimen Description   Final    BLOOD RIGHT HAND Performed at [facility], [address], [place], [place] [address]    Special Requests   Final    AEB Blood Culture results may not be optimal due to an inadequate volume of blood received in culture bottles Performed at [facility], [address] [place], [place] [address]    Culture   Final    NO GROWTH 5  DAYS Performed at [facility], [address] [place], [place] [address]    Report Status 09/21/2019 FINAL  Final  Culture, blood (routine x 2)     Status:  None (Preliminary result)   Collection Time: 2019-10-19 12:18 PM   Specimen: BLOOD  Result Value Ref Range Status   Specimen Description   Final    BLOOD RIGHT ANTECUBITAL Performed at [facility], [address], [place], [place] [address]    Special Requests   Final    BOTTLES DRAWN AEROBIC ONLY Blood Culture adequate volume Performed at [facility], [address], [place], [place] [address]    Culture   Final    NO GROWTH 1 DAY Performed at [facility], [address] [place], [place] [address]    Report Status PENDING  Incomplete  Culture, blood (routine x 2)     Status: None (Preliminary result)   Collection Time: Oct 19, 2019  2:39 PM   Specimen: BLOOD RIGHT HAND  Result Value Ref Range Status   Specimen Description   Final    BLOOD RIGHT HAND Performed at [facility], [address], [place], [place] [address]    Special Requests   Final    BOTTLES DRAWN AEROBIC ONLY Blood Culture results may not be optimal due to an inadequate volume of blood received in culture bottles   Culture   Final    NO GROWTH < 24 HOURS Performed at [facility], [address] [place], [place] [address]    Report Status PENDING  Incomplete     Medications:   . apixaban  5 mg Oral BID  . vitamin C  500 mg Oral Daily  . furosemide  40 mg Oral Daily  . insulin aspart  0-5 Units Subcutaneous QHS  . insulin aspart  0-9 Units Subcutaneous TID WC  . Ipratropium-Albuterol  1 puff Inhalation Q6H  . latanoprost  1 drop Both Eyes Daily  . methylPREDNISolone (SOLU-MEDROL) injection  60 mg Intravenous Q12H  . pantoprazole  40 mg Oral Daily  . zinc sulfate  220 mg Oral Daily   Continuous Infusions: . azithromycin 500 mg (09/23/19 1823)  . cefTRIAXone (ROCEPHIN)   IV 2 g (09/23/19 1730)      LOS: 2 days   Marinda Elk  [facility]  09/24/2019, 7:47 AM

## 2019-09-24 NOTE — Plan of Care (Signed)
Patient continues to require 15L HFNC and NRB when moving from bed to chair, and chair to [facility]. Recovering quicker today compared to yesterday. Compliant with IS and flutter valve today - was unable to tolerate yesterday. Updated family. Afebrile. A&O X4.    Problem: Education: Goal: Knowledge of risk factors and measures for prevention of condition will improve Outcome: Progressing   Problem: Coping: Goal: Psychosocial and spiritual needs will be supported Outcome: Progressing   Problem: Respiratory: Goal: Will maintain a patent airway Outcome: Progressing

## 2019-09-24 NOTE — Evaluation (Signed)
Physical Therapy Evaluation Patient Details Name: Roy Lee MRN: [id number] DOB: [birth year] Today's Date: 09/24/2019   History of Present Illness  Pt is a 64 y.o. male admitted 09/03/19 with worsening SOB, had tested (+) for COVID-19 four days prior. Worked up for acute hypoxic respiratory failure secondary to COVID-19 viral pneumonitis requiring HFNC. CT angiogram with small bilateral PEs, no evidence of heart strain. PMH includes HTN, arthritis, obesity.  Clinical Impression  Very pleasant but generally weak, easily fatigued /receptive to PT. He was in bed when PT arrived. RN advised to monitor closely as he tends to desat very easily. 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 No AD, and was totally independent in all ADLs PLOF. Currently on HFNC-15 LPM- and NRB. Does exhibit anxiety. Instructed in UE/LE ex and printed sheets/theraband issued. Also reviewed IS and Flutter Valve unit with fairly good return demo. Tends to breathe rapidly and has difficulty slowing down.     Follow Up Recommendations Home health PT(Given his home environment- a shed, if he stays in other accomodations)    Equipment Recommendations       Recommendations for Other Services   Recommend OT consult while here    Precautions / Restrictions Precautions Precautions: Other (comment) Precaution Comments: high anxiety and desat from this Restrictions Weight Bearing Restrictions: No      Mobility  Bed Mobility Overal bed mobility: Independent             General bed mobility comments: In bed when PT arrived- transfered to [facility] chair  Transfers Overall transfer level: Needs assistance Equipment used: None Transfers: Sit to/from [facility] Sit to Stand: Min guard Stand pivot transfers: Min guard       General transfer comment: Needed Min Guard to day as he tends to quickly desat- and increased  RR  Ambulation/Gait Ambulation/Gait assistance: Min guard Gait Distance (Feet): 9 Feet Assistive device: 1 person hand held assist Gait Pattern/deviations: Step-through pattern Gait velocity: decreased   General Gait Details: as soon as pt stands from chair he suddenly becomes quite apprehensive and states he cant breathe and is not getting enough air  Stairs            Wheelchair Mobility    Modified Rankin (Stroke Patients Only)       Balance Overall balance assessment: Mild deficits observed, not formally tested                                           Pertinent Vitals/Pain Pain Assessment: No/denies pain    Home Living Family/patient expects to be discharged to:: Private residence Living Arrangements: Alone Available Help at Discharge: Family;Available PRN/intermittently Type of Home: Other(Comment)(See below- lives in unfurnished shed)       Home Layout: One level   Additional Comments: May be able to d/c to daughter's house, or set up hotel/airbnb if needed. Lives in refurnished shed with heat and electricity, showers at fire station    Prior Function Level of Independence: Independent         Comments: Works full-time in Sport and exercise psychologist; also works as Social research officer, government. Plans to get pulse ox     Hand Dominance   Dominant Hand: Right    Extremity/Trunk Assessment        Lower Extremity Assessment Lower Extremity  Assessment: Generalized weakness    Cervical / Trunk Assessment Cervical / Trunk Assessment: Normal  Communication   Communication: No difficulties  Cognition Arousal/Alertness: Awake/alert Behavior During Therapy: Anxious Overall Cognitive Status: Impaired/Different from baseline Area of Impairment: Safety/judgement                         Safety/Judgement: Decreased awareness of safety            General Comments      Exercises General Exercises - Upper  Extremity Shoulder Flexion: AROM;Seated;Theraband Shoulder ABduction: AROM;Supine;Theraband Theraband Level (Shoulder Abduction): Level 1 (Yellow) Shoulder Horizontal ABduction: Seated;Theraband Theraband Level (Shoulder Horizontal Abduction): Level 1 (Yellow) Shoulder Horizontal ADduction: AROM;Seated;Theraband Elbow Flexion: AROM;Seated;Theraband Theraband Level (Elbow Flexion): Level 1 (Yellow) Elbow Extension: AROM;Seated General Exercises - Lower Extremity Ankle Circles/Pumps: AROM;Seated Short Arc Quad: AROM;Seated Hip ABduction/ADduction: AROM;Seated Straight Leg Raises: AROM;Seated Hip Flexion/Marching: AROM;Seated Other Exercises Other Exercises: pursed lip breathing Other Exercises: incentive spirometer x 10 Other Exercises: Flutter valve unit- with good return demo Other Exercises: Issued UE/LE ex with theraband   Assessment/Plan    PT Assessment Patient needs continued PT services  PT Problem List Decreased strength;Decreased activity tolerance;Cardiopulmonary status limiting activity       PT Treatment Interventions DME instruction;Gait training;Stair training;Functional mobility training;Therapeutic activities;Therapeutic exercise;Balance training;Patient/family education    PT Goals (Current goals can be found in the Care Plan section)  Acute Rehab PT Goals Patient Stated Goal: states he is scared of going home as he is so vulnerable and helpless.  PT Goal Formulation: With patient Time For Goal Achievement: 10/08/19 Potential to Achieve Goals: Good    Frequency Min 3X/week   Barriers to discharge        Co-evaluation               AM-PAC PT "6 Clicks" Mobility  Outcome Measure Help needed turning from your back to your side while in a flat bed without using bedrails?: A Little Help needed moving from lying on your back to sitting on the side of a flat bed without using bedrails?: A Little Help needed moving to and from a bed to a chair  (including a wheelchair)?: A Little Help needed standing up from a chair using your arms (e.g., wheelchair or bedside chair)?: A Little Help needed to walk in hospital room?: A Lot Help needed climbing 3-5 steps with a railing? : A Lot 6 Click Score: 16    End of Session Equipment Utilized During Treatment: Oxygen Activity Tolerance: Patient limited by fatigue Patient left: in chair;with call bell/phone within reach Nurse Communication: Mobility status PT Visit Diagnosis: Other abnormalities of gait and mobility (R26.89);Muscle weakness (generalized) (M62.81)    Time: 6073-7106 PT Time Calculation (min) (ACUTE ONLY): 44 min   Charges:   PT Evaluation $PT Eval Moderate Complexity: 1 Mod PT Treatments $Gait Training: 8-22 mins $Therapeutic Exercise: 8-22 mins $Therapeutic Activity: 8-22 mins       Harriett Rush, PT # [id number] CGV cell   Ephriam Jenkins 09/24/2019, 1:56 PM

## 2019-09-25 LAB — COMPREHENSIVE METABOLIC PANEL
ALT: 24 U/L (ref 0–44)
AST: 24 U/L (ref 15–41)
Albumin: 3.1 g/dL — ABNORMAL LOW (ref 3.5–5.0)
Alkaline Phosphatase: 72 U/L (ref 38–126)
Anion gap: 8 (ref 5–15)
BUN: 24 mg/dL — ABNORMAL HIGH (ref 8–23)
CO2: 26 mmol/L (ref 22–32)
Calcium: 8.4 mg/dL — ABNORMAL LOW (ref 8.9–10.3)
Chloride: 105 mmol/L (ref 98–111)
Creatinine, Ser: 0.74 mg/dL (ref 0.61–1.24)
GFR calc Af Amer: >60 mL/min (ref >60)
GFR calc non Af Amer: >60 mL/min (ref >60)
Glucose, Bld: 138 mg/dL — ABNORMAL HIGH (ref 70–99)
Potassium: 4.9 mmol/L (ref 3.5–5.1)
Sodium: 139 mmol/L (ref 135–145)
Total Bilirubin: 0.7 mg/dL (ref 0.3–1.2)
Total Protein: 6 g/dL — ABNORMAL LOW (ref 6.5–8.1)

## 2019-09-25 LAB — CBC WITH DIFFERENTIAL/PLATELET
Abs Immature Granulocytes: 0.06 10*3/uL (ref 0.00–0.07)
Basophils Absolute: 0 10*3/uL (ref 0.0–0.1)
Basophils Relative: 0 %
Eosinophils Absolute: 0 10*3/uL (ref 0.0–0.5)
Eosinophils Relative: 0 %
HCT: 41.9 % (ref 39.0–52.0)
Hemoglobin: 14.1 g/dL (ref 13.0–17.0)
Immature Granulocytes: 1 %
Lymphocytes Relative: 6 %
Lymphs Abs: 0.6 10*3/uL — ABNORMAL LOW (ref 0.7–4.0)
MCH: 30 pg (ref 26.0–34.0)
MCHC: 33.7 g/dL (ref 30.0–36.0)
MCV: 89.1 fL (ref 80.0–100.0)
Monocytes Absolute: 0.3 10*3/uL (ref 0.1–1.0)
Monocytes Relative: 3 %
Neutro Abs: 9 10*3/uL — ABNORMAL HIGH (ref 1.7–7.7)
Neutrophils Relative %: 90 %
Platelets: 139 10*3/uL — ABNORMAL LOW (ref 150–400)
RBC: 4.7 MIL/uL (ref 4.22–5.81)
RDW: 15.4 % (ref 11.5–15.5)
WBC: 9.9 10*3/uL (ref 4.0–10.5)
nRBC: 0 % (ref 0.0–0.2)

## 2019-09-25 LAB — MAGNESIUM: Magnesium: 2.1 mg/dL (ref 1.7–2.4)

## 2019-09-25 LAB — GLUCOSE, CAPILLARY
Glucose-Capillary: 139 mg/dL — ABNORMAL HIGH (ref 70–99)
Glucose-Capillary: 168 mg/dL — ABNORMAL HIGH (ref 70–99)
Glucose-Capillary: 170 mg/dL — ABNORMAL HIGH (ref 70–99)
Glucose-Capillary: 139 mg/dL — ABNORMAL HIGH (ref 70–99)

## 2019-09-25 LAB — D-DIMER, QUANTITATIVE: D-Dimer, Quant: 6.8 ug/mL-FEU — ABNORMAL HIGH (ref 0.00–0.50)

## 2019-09-25 LAB — PHOSPHORUS: Phosphorus: 2.9 mg/dL (ref 2.5–4.6)

## 2019-09-25 LAB — FERRITIN: Ferritin: 226 ng/mL (ref 24–336)

## 2019-09-25 LAB — C-REACTIVE PROTEIN: CRP: 2.9 mg/dL — ABNORMAL HIGH (ref <1.0)

## 2019-09-25 MED ORDER — METHYLPREDNISOLONE SODIUM SUCC 40 MG IJ SOLR
10.0000 mg | Freq: Once | INTRAMUSCULAR | Status: AC
  Administered 2019-09-25: 10 mg via INTRAVENOUS
  Filled 2019-09-25: qty 1

## 2019-09-25 MED ORDER — OXYMETAZOLINE HCL 0.05 % NA SOLN
1.0000 | Freq: Two times a day (BID) | NASAL | Status: DC
  Administered 2019-09-25 – 2019-09-30 (×7): 1 via NASAL
  Filled 2019-09-25 (×2): qty 15

## 2019-09-25 NOTE — Progress Notes (Signed)
[name]    Progress Note  Roy Lee  ZOX:[id number] DOB: 1955/11/05 DOA: 09/27/2019 PCP: Roy Leriche, PA-C     Brief Narrative:   Roy Lee is an 64 y.o. male past medical history significant of essential hypertension, obesity PE on Eliquis recently discharged from [facility] for COVID-19 pneumonia during admission he received steroids remdesivir and Actemra and was discharged on 3 to 5 L of oxygen to complete his 10-day course of dexamethasone as an outpatient.  He relates he had about 1 good week.  But over the past for 5 days he has been regressing, went to the ED and was found to be fluid overloaded started on Lasix shortness of breath started on the day of admission.  Assessment/Plan:   Acute respiratory failure with hypoxia secondary to healthcare associated pneumonia and/or acute respiratory distress syndrome due to COVID-19 virus: Mild leukocytosis with worsening shortness of breath and a low yield procalcitonin hard to differentiate bacterial versus infectious, he did have elevated lactic acid on admission. CT angio on 10/01/2019 showed worsening bilateral infiltrates but no PE. Keep the patient prone for at least 16 hours a day if not prone out of bed to chair.  Encourage incentive spirometry and flutter valve. Patient is requiring this morning 8 L of high flow nasal cannula to keep saturations above 93%. Physical therapy recommended home health PT. Continue IV remdesivir and steroids, Rocephin and azithromycin he has remained afebrile continues to improve. CRP is improved this morning, his D-dimer is trending up we will continue to follow closely.  Elevated lactic acid: Likely due to hypoxia patient is currently not septic.  Bilateral PE: Continue Eliquis.  Essential hypertension: Hold lisinopril continue Lasix strict I's and O's and daily weights.  Obesity, Class III, BMI 40-49.9 (morbid obesity) ([place]) Noted    DVT  prophylaxis: lovenox Family Communication:none Disposition Plan/Barrier to D/C:   Code Status:     Code Status Orders  (From admission, onward)         Start     Ordered   09/25/2019 2301  Full code  Continuous     09/15/2019 2301        Code Status History    Date Active Date Inactive Code Status Order ID Comments User Context   09/04/2019 0637 09/09/2019 2201 Full Code [id number]  Acheampong, Warnell Bureau, MD Inpatient   Advance Care Planning Activity        IV Access:    Peripheral IV   Procedures and diagnostic studies:   No results found.   Medical Consultants:    None.  Anti-Infectives:   IV remdesivir, Rocephin and azithromycin  Subjective:    Roy Lee he was able to sleep better overnight..  Objective:    Vitals:   09/24/19 1702 09/24/19 1936 09/25/19 0330 09/25/19 0715  BP: (!) 128/93 120/79 (!) 136/91 (!) 141/81  Pulse:  81 81 79  Resp: 20 20 20  (!) 22  Temp:  98.6 F (37 C) 97.8 F (36.6 C) 98.4 F (36.9 C)  TempSrc:  Oral Oral Oral  SpO2:  94% 93% 90%  Weight:      Height:       SpO2: 90 % O2 Flow Rate (L/min): 15 L/min   Intake/Output Summary (Last 24 hours) at 09/25/2019 0750 Last data filed at 09/24/2019 1700 Gross per 24 hour  Intake -  Output 825 ml  Net -825 ml   Filed Weights   09/15/2019 1155  Weight: 117.9 kg    Exam: General exam: In no acute distress. Respiratory system: Good air movement and diffuse crackles bilaterally. Cardiovascular system: S1 & S2 heard, RRR. No JVD. Gastrointestinal system: Abdomen is nondistended, soft and nontender.  Extremities: No pedal edema. Skin: No rashes, lesions or ulcers   Data Reviewed:    Labs: Basic Metabolic Panel: Recent Labs  Lab 25-Sep-2019 1218 2019-09-25 1218 09/23/19 0145 09/23/19 0145 09/24/19 0140 09/25/19 0341  NA 136  --  137  --  137 139  K 4.2   < > 4.8   < > 5.0 4.9  CL 101  --  105  --  106 105  CO2 24  --  24  --  25 26  GLUCOSE 126*  --  168*   --  116* 138*  BUN 15  --  15  --  18 24*  CREATININE 0.84  --  0.72  --  0.70 0.74  CALCIUM 8.5*  --  8.2*  --  8.4* 8.4*  MG  --   --  2.1  --  2.1 2.1  PHOS  --   --  2.9  --  2.6 2.9   < > = values in this interval not displayed.   GFR Estimated Creatinine Clearance: 126.6 mL/min (by C-G formula based on SCr of 0.74 mg/dL). Liver Function Tests: Recent Labs  Lab 09/25/2019 1218 09/23/19 0145 09/24/19 0140 09/25/19 0341  AST 31 23 25 24   ALT 34 25 24 24   ALKPHOS 57 48 64 72  BILITOT 1.5* 0.5 0.8 0.7  PROT 7.3 6.0* 5.8* 6.0*  ALBUMIN 3.7 3.2* 2.9* 3.1*   No results for input(s): LIPASE, AMYLASE in the last 168 hours. No results for input(s): AMMONIA in the last 168 hours. Coagulation profile No results for input(s): INR, PROTIME in the last 168 hours. COVID-19 Labs  Recent Labs    Sep 25, 2019 1220 Sep 25, 2019 1220 09/23/19 0145 09/24/19 0140 09/25/19 0341  DDIMER 4.17*   < > 3.10* 5.04* 6.80*  FERRITIN 224  --  241 241  --   CRP 6.3*  --  9.7* 6.4*  --    < > = values in this interval not displayed.    No results found for: SARSCOV2NAA  CBC: Recent Labs  Lab 09-25-19 1218 09/23/19 0145 09/24/19 0140 09/25/19 0341  WBC 10.9* 8.2 12.8* 9.9  NEUTROABS 9.8* 7.8* 11.4* 9.0*  HGB 16.4 14.1 13.4 14.1  HCT 49.2 42.3 39.2 41.9  MCV 90.1 89.1 88.1 89.1  PLT 153 127* 138* 139*   Cardiac Enzymes: No results for input(s): CKTOTAL, CKMB, CKMBINDEX, TROPONINI in the last 168 hours. BNP (last 3 results) No results for input(s): PROBNP in the last 8760 hours. CBG: Recent Labs  Lab 09/23/19 2034 09/24/19 0819 09/24/19 1232 09/24/19 1650 09/24/19 2054  GLUCAP 220* 151* 151* 156* 188*   D-Dimer: Recent Labs    09/24/19 0140 09/25/19 0341  DDIMER 5.04* 6.80*   Hgb A1c: No results for input(s): HGBA1C in the last 72 hours. Lipid Profile: No results for input(s): CHOL, HDL, LDLCALC, TRIG, CHOLHDL, LDLDIRECT in the last 72 hours. Thyroid function studies: No  results for input(s): TSH, T4TOTAL, T3FREE, THYROIDAB in the last 72 hours.  Invalid input(s): FREET3 Anemia work up: Recent Labs    09/23/19 0145 09/24/19 0140  FERRITIN 241 241   Sepsis Labs: Recent Labs  Lab 09-25-2019 1218 09-25-19 1222 September 25, 2019 1439 09-25-2019 1641 09-25-2019 1753 09/23/19 0145 09/24/19 0140 09/25/19 0341  PROCALCITON  --   --   --  0.15  --   --  0.15  --   WBC 10.9*  --   --   --   --  8.2 12.8* 9.9  LATICACIDVEN  --  2.3* 2.7*  --  1.1  --   --   --    Microbiology Recent Results (from the past 240 hour(s))  Culture, blood (routine x 2)     Status: None   Collection Time: 09/16/19 12:30 PM   Specimen: BLOOD LEFT FOREARM  Result Value Ref Range Status   Specimen Description   Final    BLOOD LEFT FOREARM Performed at [facility], [address] [place], [place] [address]    Special Requests   Final    BOTTLES DRAWN AEROBIC AND ANAEROBIC Blood Culture results may not be optimal due to an excessive volume of blood received in culture bottles Performed at [facility], [address] [place], [place] [address]    Culture   Final    NO GROWTH 5 DAYS Performed at [facility], [address], [place], [place] [address]    Report Status 09/21/2019 FINAL  Final  Culture, blood (routine x 2)     Status: None   Collection Time: 09/16/19 12:54 PM   Specimen: BLOOD RIGHT HAND  Result Value Ref Range Status   Specimen Description   Final    BLOOD RIGHT HAND Performed at [facility], [address], [place], [place] [address]    Special Requests   Final    AEB Blood Culture results may not be optimal due to an inadequate volume of blood received in culture bottles Performed at [facility], [address], [place], [place] [address]    Culture   Final    NO GROWTH 5 DAYS Performed at [facility], [address] [place], [place] [address]    Report Status  09/21/2019 FINAL  Final  Culture, blood (routine x 2)     Status: None (Preliminary result)   Collection Time: 09/28/2019 12:18 PM   Specimen: BLOOD  Result Value Ref Range Status   Specimen Description   Final    BLOOD RIGHT ANTECUBITAL Performed at [facility], [address] [place], [place] [address]    Special Requests   Final    BOTTLES DRAWN AEROBIC ONLY Blood Culture adequate volume Performed at [facility], [address], [place], [place] [address]    Culture   Final    NO GROWTH 3 DAYS Performed at [facility], [address] [place], [place] [address]    Report Status PENDING  Incomplete  Culture, blood (routine x 2)     Status: None (Preliminary result)   Collection Time: 09/28/2019  2:39 PM   Specimen: BLOOD RIGHT HAND  Result Value Ref Range Status   Specimen Description   Final    BLOOD RIGHT HAND Performed at [facility], [address], [place], [place] [address]    Special Requests   Final    BOTTLES DRAWN AEROBIC ONLY Blood Culture results may not be optimal due to an inadequate volume of blood received in culture bottles   Culture   Final    NO GROWTH 3 DAYS Performed at [facility], [address] [place], [place] [address]    Report Status PENDING  Incomplete  Sputum culture  Status: None   Collection Time: 09/24/19 12:00 PM   Specimen: Expectorated Sputum  Result Value Ref Range Status   Specimen Description EXPECTORATED SPUTUM  Final   Special Requests NONE  Final   Sputum evaluation   Final    THIS SPECIMEN IS ACCEPTABLE FOR SPUTUM CULTURE Performed at [facility], [address] [place], [place] [address]    Report Status 09/24/2019 FINAL  Final  Culture, respiratory     Status: None (Preliminary result)   Collection Time: 09/24/19 12:00 PM  Result Value Ref Range Status   Specimen Description   Final    EXPECTORATED SPUTUM Performed at [facility], [address], [place], [place] [address]    Special Requests   Final    NONE Reflexed from [id number] Performed at [facility], [address] [place], [place] [address]    Gram Stain   Final    FEW WBC PRESENT, PREDOMINANTLY PMN MODERATE GRAM POSITIVE RODS MODERATE GRAM POSITIVE COCCI IN PAIRS IN CLUSTERS FEW GRAM NEGATIVE RODS Performed at [facility], [address], [place], [place] [address]    Culture PENDING  Incomplete   Report Status PENDING  Incomplete     Medications:   . apixaban  5 mg Oral BID  . vitamin C  500 mg Oral Daily  . furosemide  40 mg Oral Daily  . insulin aspart  0-5 Units Subcutaneous QHS  . insulin aspart  0-9 Units Subcutaneous TID WC  . Ipratropium-Albuterol  1 puff Inhalation Q6H  . latanoprost  1 drop Both Eyes Daily  . methylPREDNISolone (SOLU-MEDROL) injection  20 mg Intravenous Q12H  . pantoprazole  40 mg Oral Daily  . zinc sulfate  220 mg Oral Daily   Continuous Infusions: . azithromycin 500 mg (09/24/19 1912)  . cefTRIAXone (ROCEPHIN)  IV 2 g (09/24/19 1755)      LOS: 3 days   Marinda Elk  [facility]  09/25/2019, 7:50 AM

## 2019-09-26 ENCOUNTER — Inpatient Hospital Stay (HOSPITAL_COMMUNITY): Payer: 59

## 2019-09-26 DIAGNOSIS — J80 Acute respiratory distress syndrome: Secondary | ICD-10-CM

## 2019-09-26 DIAGNOSIS — I4891 Unspecified atrial fibrillation: Secondary | ICD-10-CM

## 2019-09-26 DIAGNOSIS — I2699 Other pulmonary embolism without acute cor pulmonale: Secondary | ICD-10-CM

## 2019-09-26 DIAGNOSIS — U071 COVID-19: Principal | ICD-10-CM

## 2019-09-26 LAB — CBC WITH DIFFERENTIAL/PLATELET
Abs Immature Granulocytes: 0.06 10*3/uL (ref 0.00–0.07)
Basophils Absolute: 0 10*3/uL (ref 0.0–0.1)
Basophils Relative: 0 %
Eosinophils Absolute: 0.4 10*3/uL (ref 0.0–0.5)
Eosinophils Relative: 5 %
HCT: 46.4 % (ref 39.0–52.0)
Hemoglobin: 15.6 g/dL (ref 13.0–17.0)
Immature Granulocytes: 1 %
Lymphocytes Relative: 14 %
Lymphs Abs: 1.3 10*3/uL (ref 0.7–4.0)
MCH: 30.1 pg (ref 26.0–34.0)
MCHC: 33.6 g/dL (ref 30.0–36.0)
MCV: 89.6 fL (ref 80.0–100.0)
Monocytes Absolute: 0.2 10*3/uL (ref 0.1–1.0)
Monocytes Relative: 2 %
Neutro Abs: 7 10*3/uL (ref 1.7–7.7)
Neutrophils Relative %: 78 %
Platelets: 128 10*3/uL — ABNORMAL LOW (ref 150–400)
RBC: 5.18 MIL/uL (ref 4.22–5.81)
RDW: 15.6 % — ABNORMAL HIGH (ref 11.5–15.5)
WBC: 9 10*3/uL (ref 4.0–10.5)
nRBC: 0 % (ref 0.0–0.2)

## 2019-09-26 LAB — POCT I-STAT 7, (LYTES, BLD GAS, ICA,H+H)
Acid-Base Excess: 3 mmol/L — ABNORMAL HIGH (ref 0.0–2.0)
Acid-base deficit: 7 mmol/L — ABNORMAL HIGH (ref 0.0–2.0)
Bicarbonate: 26.5 mmol/L (ref 20.0–28.0)
Bicarbonate: 34.4 mmol/L — ABNORMAL HIGH (ref 20.0–28.0)
Calcium, Ion: 1.21 mmol/L (ref 1.15–1.40)
Calcium, Ion: 1.13 mmol/L — ABNORMAL LOW (ref 1.15–1.40)
HCT: 48 % (ref 39.0–52.0)
HCT: 44 % (ref 39.0–52.0)
Hemoglobin: 16.3 g/dL (ref 13.0–17.0)
Hemoglobin: 15 g/dL (ref 13.0–17.0)
O2 Saturation: 88 %
O2 Saturation: 97 %
Patient temperature: 100.7
Patient temperature: 98.7
Potassium: 5 mmol/L (ref 3.5–5.1)
Potassium: 4.8 mmol/L (ref 3.5–5.1)
Sodium: 137 mmol/L (ref 135–145)
Sodium: 138 mmol/L (ref 135–145)
TCO2: 29 mmol/L (ref 22–32)
TCO2: 37 mmol/L — ABNORMAL HIGH (ref 22–32)
pCO2 arterial: >97 mmHg (ref 32.0–48.0)
pCO2 arterial: 89.9 mmHg (ref 32.0–48.0)
pH, Arterial: 7.035 — CL (ref 7.350–7.450)
pH, Arterial: 7.191 — CL (ref 7.350–7.450)
pO2, Arterial: 88 mmHg (ref 83.0–108.0)
pO2, Arterial: 116 mmHg — ABNORMAL HIGH (ref 83.0–108.0)

## 2019-09-26 LAB — COMPREHENSIVE METABOLIC PANEL
ALT: 22 U/L (ref 0–44)
AST: 24 U/L (ref 15–41)
Albumin: 3 g/dL — ABNORMAL LOW (ref 3.5–5.0)
Alkaline Phosphatase: 80 U/L (ref 38–126)
Anion gap: 11 (ref 5–15)
BUN: 22 mg/dL (ref 8–23)
CO2: 27 mmol/L (ref 22–32)
Calcium: 8.4 mg/dL — ABNORMAL LOW (ref 8.9–10.3)
Chloride: 100 mmol/L (ref 98–111)
Creatinine, Ser: 0.78 mg/dL (ref 0.61–1.24)
GFR calc Af Amer: >60 mL/min (ref >60)
GFR calc non Af Amer: >60 mL/min (ref >60)
Glucose, Bld: 138 mg/dL — ABNORMAL HIGH (ref 70–99)
Potassium: 4 mmol/L (ref 3.5–5.1)
Sodium: 138 mmol/L (ref 135–145)
Total Bilirubin: 0.7 mg/dL (ref 0.3–1.2)
Total Protein: 6.1 g/dL — ABNORMAL LOW (ref 6.5–8.1)

## 2019-09-26 LAB — MRSA PCR SCREENING: MRSA by PCR: NEGATIVE

## 2019-09-26 LAB — PHOSPHORUS
Phosphorus: 2.6 mg/dL (ref 2.5–4.6)
Phosphorus: 5.2 mg/dL — ABNORMAL HIGH (ref 2.5–4.6)

## 2019-09-26 LAB — GLUCOSE, CAPILLARY
Glucose-Capillary: 242 mg/dL — ABNORMAL HIGH (ref 70–99)
Glucose-Capillary: 256 mg/dL — ABNORMAL HIGH (ref 70–99)
Glucose-Capillary: 280 mg/dL — ABNORMAL HIGH (ref 70–99)
Glucose-Capillary: 295 mg/dL — ABNORMAL HIGH (ref 70–99)
Glucose-Capillary: 95 mg/dL (ref 70–99)

## 2019-09-26 LAB — INFLUENZA PANEL BY PCR (TYPE A & B)
Influenza A By PCR: NEGATIVE
Influenza B By PCR: NEGATIVE

## 2019-09-26 LAB — TSH: TSH: 1.94 u[IU]/mL (ref 0.350–4.500)

## 2019-09-26 LAB — MAGNESIUM
Magnesium: 2 mg/dL (ref 1.7–2.4)
Magnesium: 2.1 mg/dL (ref 1.7–2.4)

## 2019-09-26 LAB — FERRITIN: Ferritin: 212 ng/mL (ref 24–336)

## 2019-09-26 LAB — D-DIMER, QUANTITATIVE: D-Dimer, Quant: 16.69 ug/mL-FEU — ABNORMAL HIGH (ref 0.00–0.50)

## 2019-09-26 LAB — C-REACTIVE PROTEIN: CRP: 4.3 mg/dL — ABNORMAL HIGH (ref <1.0)

## 2019-09-26 MED ORDER — DILTIAZEM LOAD VIA INFUSION
10.0000 mg | Freq: Once | INTRAVENOUS | Status: AC
  Administered 2019-09-26: 10 mg via INTRAVENOUS
  Filled 2019-09-26: qty 10

## 2019-09-26 MED ORDER — SODIUM CHLORIDE 0.9% FLUSH: 10.0000 mL | INTRAVENOUS | Status: DC | PRN

## 2019-09-26 MED ORDER — FENTANYL 2500MCG IN NS 250ML (10MCG/ML) PREMIX INFUSION: 50.0000 ug/h | INTRAVENOUS | Status: DC

## 2019-09-26 MED ORDER — FENTANYL BOLUS VIA INFUSION
50.0000 ug | INTRAVENOUS | Status: DC | PRN
  Administered 2019-09-27 – 2019-10-01 (×7): 50 ug via INTRAVENOUS
  Filled 2019-09-26: qty 50

## 2019-09-26 MED ORDER — DILTIAZEM HCL-DEXTROSE 125-5 MG/125ML-% IV SOLN (PREMIX)
5.0000 mg/h | INTRAVENOUS | Status: DC
  Administered 2019-09-26 (×2): 20 mg/h via INTRAVENOUS
  Administered 2019-09-26: 01:00:00 5 mg/h via INTRAVENOUS
  Administered 2019-09-27: 17:00:00 15 mg/h via INTRAVENOUS
  Administered 2019-09-28: 19:00:00 5 mg/h via INTRAVENOUS
  Filled 2019-09-26 (×7): qty 125

## 2019-09-26 MED ORDER — CHLORHEXIDINE GLUCONATE CLOTH 2 % EX PADS
6.0000 | MEDICATED_PAD | Freq: Every day | CUTANEOUS | Status: DC
  Administered 2019-09-26 – 2019-10-13 (×22): 6 via TOPICAL

## 2019-09-26 MED ORDER — ETOMIDATE 2 MG/ML IV SOLN
INTRAVENOUS | Status: AC
  Administered 2019-09-26: 20 mg
  Filled 2019-09-26: qty 10

## 2019-09-26 MED ORDER — CISATRACURIUM BOLUS VIA INFUSION
10.0000 mg | Freq: Once | INTRAVENOUS | Status: AC
  Administered 2019-09-26: 10 mg via INTRAVENOUS
  Filled 2019-09-26: qty 10

## 2019-09-26 MED ORDER — DILTIAZEM LOAD VIA INFUSION
15.0000 mg | Freq: Once | INTRAVENOUS | Status: DC
  Filled 2019-09-26: qty 15

## 2019-09-26 MED ORDER — VITAL AF 1.2 CAL PO LIQD
1000.0000 mL | ORAL | Status: DC
  Administered 2019-09-27 – 2019-10-03 (×8): 1000 mL

## 2019-09-26 MED ORDER — METOPROLOL TARTRATE 25 MG PO TABS
25.0000 mg | ORAL_TABLET | Freq: Two times a day (BID) | ORAL | Status: DC
  Administered 2019-09-27 – 2019-09-28 (×2): 25 mg via ORAL
  Filled 2019-09-26 (×3): qty 1

## 2019-09-26 MED ORDER — FUROSEMIDE 10 MG/ML IJ SOLN
40.0000 mg | Freq: Four times a day (QID) | INTRAMUSCULAR | Status: AC
  Administered 2019-09-26 (×2): 40 mg via INTRAVENOUS
  Filled 2019-09-26 (×2): qty 4

## 2019-09-26 MED ORDER — ROCURONIUM BROMIDE 10 MG/ML (PF) SYRINGE
PREFILLED_SYRINGE | INTRAVENOUS | Status: AC
  Administered 2019-09-26: 100 mg
  Filled 2019-09-26: qty 10

## 2019-09-26 MED ORDER — MIDAZOLAM 50MG/50ML (1MG/ML) PREMIX INFUSION
INTRAVENOUS | Status: AC
  Filled 2019-09-26: qty 50

## 2019-09-26 MED ORDER — LORAZEPAM 2 MG/ML IJ SOLN
INTRAMUSCULAR | Status: AC
  Administered 2019-09-26: 10:00:00 2 mg
  Filled 2019-09-26: qty 1

## 2019-09-26 MED ORDER — METOPROLOL TARTRATE 5 MG/5ML IV SOLN
5.0000 mg | Freq: Four times a day (QID) | INTRAVENOUS | Status: DC | PRN
  Administered 2019-09-26 – 2019-10-10 (×5): 5 mg via INTRAVENOUS
  Filled 2019-09-26 (×6): qty 5

## 2019-09-26 MED ORDER — FENTANYL BOLUS VIA INFUSION
50.0000 ug | INTRAVENOUS | Status: DC | PRN
  Filled 2019-09-26: qty 50

## 2019-09-26 MED ORDER — FENTANYL 2500MCG IN NS 250ML (10MCG/ML) PREMIX INFUSION
50.0000 ug/h | INTRAVENOUS | Status: DC
  Administered 2019-09-27: 100 ug/h via INTRAVENOUS
  Administered 2019-09-28 – 2019-09-29 (×3): 150 ug/h via INTRAVENOUS
  Administered 2019-09-30: 125 ug/h via INTRAVENOUS
  Administered 2019-10-01: 200 ug/h via INTRAVENOUS
  Filled 2019-09-26 (×6): qty 250

## 2019-09-26 MED ORDER — CHLORHEXIDINE GLUCONATE CLOTH 2 % EX PADS
6.0000 | MEDICATED_PAD | Freq: Every day | CUTANEOUS | Status: DC
  Administered 2019-09-26: 6 via TOPICAL

## 2019-09-26 MED ORDER — SODIUM BICARBONATE 8.4 % IV SOLN
INTRAVENOUS | Status: AC
  Administered 2019-09-26: 100 meq via INTRAVENOUS
  Filled 2019-09-26: qty 100

## 2019-09-26 MED ORDER — MIDAZOLAM BOLUS VIA INFUSION
1.0000 mg | INTRAVENOUS | Status: DC | PRN
  Filled 2019-09-26: qty 2

## 2019-09-26 MED ORDER — PRO-STAT SUGAR FREE PO LIQD
30.0000 mL | Freq: Two times a day (BID) | ORAL | Status: DC
  Administered 2019-09-26: 30 mL

## 2019-09-26 MED ORDER — MIDAZOLAM BOLUS VIA INFUSION
1.0000 mg | INTRAVENOUS | Status: DC | PRN
  Administered 2019-09-27: 22:00:00 2 mg via INTRAVENOUS
  Administered 2019-09-27: 20:00:00 1 mg via INTRAVENOUS
  Administered 2019-09-30 – 2019-10-01 (×3): 2 mg via INTRAVENOUS
  Filled 2019-09-26: qty 2

## 2019-09-26 MED ORDER — CHLORHEXIDINE GLUCONATE 0.12% ORAL RINSE (MEDLINE KIT)
15.0000 mL | Freq: Two times a day (BID) | OROMUCOSAL | Status: DC
  Administered 2019-09-26 – 2019-10-13 (×35): 15 mL via OROMUCOSAL

## 2019-09-26 MED ORDER — SODIUM BICARBONATE 8.4 % IV SOLN: 100.0000 meq | Freq: Once | INTRAVENOUS | Status: AC

## 2019-09-26 MED ORDER — SODIUM CHLORIDE 0.9 % IV SOLN
0.0000 ug/kg/min | INTRAVENOUS | Status: DC
  Administered 2019-09-26: 2 ug/kg/min via INTRAVENOUS
  Administered 2019-09-26 – 2019-09-27 (×2): 2.5 ug/kg/min via INTRAVENOUS
  Administered 2019-09-28: 4 ug/kg/min via INTRAVENOUS
  Administered 2019-09-28: 10:00:00 3 ug/kg/min via INTRAVENOUS
  Administered 2019-09-28: 19:00:00 4 ug/kg/min via INTRAVENOUS
  Administered 2019-09-29 (×3): 4.5 ug/kg/min via INTRAVENOUS
  Administered 2019-09-30 (×2): 3.5 ug/kg/min via INTRAVENOUS
  Filled 2019-09-26 (×17): qty 20

## 2019-09-26 MED ORDER — METOPROLOL TARTRATE 5 MG/5ML IV SOLN
5.0000 mg | Freq: Once | INTRAVENOUS | Status: AC
  Administered 2019-09-26: 5 mg via INTRAVENOUS
  Filled 2019-09-26: qty 5

## 2019-09-26 MED ORDER — FENTANYL 2500MCG IN NS 250ML (10MCG/ML) PREMIX INFUSION
INTRAVENOUS | Status: AC
  Administered 2019-09-26: 50 ug/h via INTRAVENOUS
  Filled 2019-09-26: qty 250

## 2019-09-26 MED ORDER — FENTANYL CITRATE (PF) 100 MCG/2ML IJ SOLN
INTRAMUSCULAR | Status: AC
  Administered 2019-09-26: 50 ug
  Filled 2019-09-26: qty 2

## 2019-09-26 MED ORDER — MIDAZOLAM 50MG/50ML (1MG/ML) PREMIX INFUSION: 0.0000 mg/h | INTRAVENOUS | Status: DC

## 2019-09-26 MED ORDER — SODIUM CHLORIDE 0.9% FLUSH
10.0000 mL | Freq: Two times a day (BID) | INTRAVENOUS | Status: DC
  Administered 2019-09-26 – 2019-09-27 (×3): 10 mL
  Administered 2019-09-28: 21:00:00 20 mL
  Administered 2019-09-28 – 2019-09-29 (×2): 10 mL
  Administered 2019-09-29 – 2019-09-30 (×2): 30 mL
  Administered 2019-09-30: 08:00:00 10 mL
  Administered 2019-10-01 – 2019-10-02 (×2): 30 mL
  Administered 2019-10-02 – 2019-10-10 (×12): 10 mL

## 2019-09-26 MED ORDER — PRO-STAT SUGAR FREE PO LIQD
30.0000 mL | Freq: Three times a day (TID) | ORAL | Status: DC
  Administered 2019-09-26 – 2019-10-03 (×21): 30 mL
  Filled 2019-09-26 (×22): qty 30

## 2019-09-26 MED ORDER — SODIUM BICARBONATE 8.4 % IV SOLN
INTRAVENOUS | Status: DC
  Filled 2019-09-26 (×7): qty 150

## 2019-09-26 MED ORDER — MIDAZOLAM 50MG/50ML (1MG/ML) PREMIX INFUSION
2.0000 mg/h | INTRAVENOUS | Status: DC
  Administered 2019-09-26: 4 mg/h via INTRAVENOUS
  Administered 2019-09-26: 2 mg/h via INTRAVENOUS
  Administered 2019-09-27 (×2): 5 mg/h via INTRAVENOUS
  Administered 2019-09-28: 08:00:00 7 mg/h via INTRAVENOUS
  Administered 2019-09-28: 8 mg/h via INTRAVENOUS
  Administered 2019-09-28 (×2): 7 mg/h via INTRAVENOUS
  Administered 2019-09-29: 18:00:00 4 mg/h via INTRAVENOUS
  Administered 2019-09-29: 6 mg/h via INTRAVENOUS
  Administered 2019-09-30 (×2): 4 mg/h via INTRAVENOUS
  Administered 2019-10-01: 01:00:00 5 mg/h via INTRAVENOUS
  Filled 2019-09-26 (×12): qty 50

## 2019-09-26 MED ORDER — LORAZEPAM 2 MG/ML IJ SOLN
0.5000 mg | INTRAMUSCULAR | Status: AC | PRN
  Administered 2019-09-26: 0.5 mg via INTRAVENOUS
  Filled 2019-09-26: qty 1

## 2019-09-26 MED ORDER — ORAL CARE MOUTH RINSE
15.0000 mL | OROMUCOSAL | Status: DC
  Administered 2019-09-26 – 2019-10-13 (×173): 15 mL via OROMUCOSAL

## 2019-09-26 MED ORDER — DILTIAZEM LOAD VIA INFUSION
15.0000 mg | Freq: Once | INTRAVENOUS | Status: AC
  Administered 2019-09-26: 15 mg via INTRAVENOUS
  Filled 2019-09-26: qty 15

## 2019-09-26 MED ORDER — ARTIFICIAL TEARS OPHTHALMIC OINT
1.0000 "application " | TOPICAL_OINTMENT | Freq: Three times a day (TID) | OPHTHALMIC | Status: DC
  Administered 2019-09-26 – 2019-10-03 (×21): 1 via OPHTHALMIC
  Filled 2019-09-26 (×3): qty 3.5

## 2019-09-26 MED ORDER — VITAL HIGH PROTEIN PO LIQD
1000.0000 mL | ORAL | Status: DC
  Administered 2019-09-26: 17:00:00 1000 mL

## 2019-09-26 MED ORDER — MIDAZOLAM HCL 2 MG/2ML IJ SOLN
INTRAMUSCULAR | Status: AC
  Administered 2019-09-26: 2 mg
  Filled 2019-09-26: qty 2

## 2019-09-26 MED ORDER — FREE WATER
150.0000 mL | Status: DC
  Administered 2019-09-26 – 2019-10-07 (×64): 150 mL

## 2019-09-26 MED ORDER — LORAZEPAM 2 MG/ML IJ SOLN: 0.5000 mg | INTRAMUSCULAR | Status: DC | PRN

## 2019-09-26 NOTE — Progress Notes (Signed)
Received pt as a rapid response from PCU in respiratory distress. Pt placed on Heated HFNC at 50L/100% plus a NRB. Pt labored, tachypnic, retractions, nasal flaring, increased WOB. Pt spo2 < 60%. CCM MD at bedside. Pt to be emergently intubated.

## 2019-09-26 NOTE — Progress Notes (Signed)
Pt seen with Rapid response RN. Upon arrival pt on 15L HFNC and 15L NRB, tachypnic but able to speak in short sentences. Pt did state he is anxious but often is at baseline. Pt moved to central side for heated high flow and ultimately brought to the ICU and emergently intubated for respiratory distress.

## 2019-09-26 NOTE — Progress Notes (Signed)
Rapid Response Event Note  Overview: Notified by patient's nurse Selena Batten and CN John that patient was tachypnic and had had an increase in his oxygen requirements. Pt had a new onset of A-fib w/RVR throughout the night and was placed on a diltiazem gtt. Patient did not have a change in LOC but was complaining of difficulties catching his breath.  Initial Focused Assessment: Upon assessment patient's respirations were in the 30s. His SP02 was in the 70s. Patient was able to communicate in short sentences however he was having a hard time catching his breath. He states he did feel more anxious since his CT scan. Pt was in A-fib on the monitor but rate was controlled in the 90s. All other vital signs were WNLs. Patient was voicing concerns over not being able to catch his breath.  Interventions: Notified Dr David Stall and he ordered a dose of 0.5mg  of versed and we moved patient to the central side for HHFNC. Upon arrival to the central side for HHFNC pt was found to be more tachypnic and his SP02 was in the 82s. Pt was then moved to the ICU for possible emergent intubation.   Upon arrival to the ICU pt was assessed by Dr Kendrick Fries and the discission to intubate was made with patient. Pt's daughter Caitlyn was notified by Dr David Stall.    Roy Lee

## 2019-09-26 NOTE — Procedures (Signed)
Intubation Procedure Note Roy Lee [id number] May 06, 1956  Procedure: Intubation Indications: Airway protection and maintenance  Procedure Details Consent: Risks of procedure as well as the alternatives and risks of each were explained to the (patient/caregiver).  Consent for procedure obtained. Time Out: Verified patient identification, verified procedure, site/side was marked, verified correct patient position, special equipment/implants available, medications/allergies/relevent history reviewed, required imaging and test results available.  Performed  Drugs Etomidate 29m IV, versed 275mIV, fentanyl 5070mIV, rocuronium 100m43m DL x 1 with MAC 4 blade Grade 3 view 7.5 ET tube passed through cords under direct visualization Placement confirmed with bilateral breath sounds, positive EtCO2 change and smoke in tube   Evaluation Hemodynamic Status: BP stable throughout; O2 sats: stable throughout Patient's Current Condition: stable Complications: No apparent complications Patient did tolerate procedure well. Chest X-ray ordered to verify placement.  CXR: tube position acceptable.   BrenRoselie Awkward6/2021

## 2019-09-26 NOTE — Progress Notes (Addendum)
Called regarding apparent atrial fibrillation on monitor with rate 120-140's.   Mr. Zielinski has hx of HTN, recent admission for COVID pna complicated by b/l PE, was discharged on 1/30 on Eliquis and with 3 Lpm supplemental O2, but worsened again back at home before returning on 2/12 with evidence of worsened multifocal PNA and suspected hypervolemia.   He denies hx of a fib, reports chronic leg swelling managed with Lasix for several yrs but never had echo.   He was noted on the monitor tonight to have rapid HR with irregular rhythm but did not experience and chest pain, palpitations, lightheadedness, or acute change in his SOB.   EKG confirms a fib with RVR.   CHADS-VASc may only be 1 for HTN but he is already on Eliquis for PE.   A fib likely precipitated by his pneumonia.   Lopressor IVP was given without much change.   He will be given 15 mg IV diltiazem and started on diltiazem infusion.   Discussed plan with patient and RN.

## 2019-09-26 NOTE — Progress Notes (Signed)
[name]    Progress Note  Roy Lee  XQJ:[id number] DOB: [birth year] DOA: 09/27/2019 PCP: [name], PA-C     Brief Narrative:   Roy Lee is an 64 y.o. male past medical history significant of essential hypertension, obesity PE on Eliquis recently discharged from [facility] for COVID-19 pneumonia during admission he received steroids remdesivir and Actemra and was discharged on 3 to 5 L of oxygen to complete his 10-day course of dexamethasone as an outpatient.  He relates he had about 1 good week.  But over the past for 5 days he has been regressing, went to the ED and was found to be fluid overloaded started on Lasix shortness of breath started on the day of admission.  Assessment/Plan:   Acute respiratory failure with hypoxia secondary to healthcare associated pneumonia and/or acute respiratory distress syndrome due to COVID-19 virus: Mild leukocytosis with worsening shortness of breath and a elevated lactic acid on admission. CT angio on 10/07/2019 showed worsening bilateral infiltrates but no PE. As he has just been treated for COVID-19 pneumonia he was started empirically on IV Rocephin and azithromycin he has remained afebrile and and his leukocytosis is resolved.  This seems more likely developing ARDS. Roy Lee this morning is requiring 15 L of oxygen to keep saturations greater than 95%.  With worsening saturations in the 90s, will go ahead and put him on heated high flow to see if it helps with his saturations. Physical therapy evaluated the patient recommended home health PT. Is probably now in ARDS progression he did receive Actemra on his last admission. We will go ahead and get a CT of the chest to rule out pneumothorax worsening fibrosis or new infiltrates.  New onset atrial fibrillation: He is currently on Eliquis for PE, he was started on a diltiazem drip overnight, his heart rate is improved.  IV diltiazem his heart rate  is around 100-110, he was started on metoprolol IV as needed and now his heart rate is less than 100.  We will start him on metoprolol orally.  And continue the IV as needed.  Elevated lactic acid: Likely due to hypoxia patient is currently not septic.  Bilateral PE: Continue Eliquis.  Essential hypertension: Hold lisinopril continue Lasix strict I's and O's and daily weights.  Obesity, Class III, BMI 40-49.9 (morbid obesity) (HCC): Noted    DVT prophylaxis: lovenox Family Communication:none Disposition Plan/Barrier to D/C: Once he is off oxygen.  Probably need to go to skilled nursing facility but at this time he is extremely tenuous.  Code Status:     Code Status Orders  (From admission, onward)         Start     Ordered   09/30/2019 2301  Full code  Continuous     10/03/2019 2301        Code Status History    Date Active Date Inactive Code Status Order ID Comments User Context   09/04/2019 0637 09/09/2019 2201 Full Code [id number]  Acheampong, Genice Rouge, MD Inpatient   Advance Care Planning Activity        IV Access:    Peripheral IV   Procedures and diagnostic studies:   No results found.   Medical Consultants:    None.  Anti-Infectives:   IV remdesivir, Rocephin and azithromycin  Subjective:    Roy Lee he relates he did not have a good night.  Objective:    Vitals:   09/26/19 0500 09/26/19 0550  09/26/19 0623 09/26/19 0701  BP: (!) 127/96 121/73 130/80 119/65  Pulse:    96  Resp:  (!) 24 (!) 24 20  Temp:    99 F (37.2 C)  TempSrc:    Oral  SpO2:  97% 95% 95%  Weight:      Height:       SpO2: 95 % O2 Flow Rate (L/min): 15 L/min   Intake/Output Summary (Last 24 hours) at 09/26/2019 0742 Last data filed at 09/26/2019 0500 Gross per 24 hour  Intake 388.29 ml  Output 500 ml  Net -111.71 ml   Filed Weights   10-02-19 1155  Weight: 117.9 kg    Exam: General exam: In no acute distress. Respiratory system: Good air  movement and diffuse crackles. Cardiovascular system: S1 & S2 heard, RRR. No JVD. Gastrointestinal system: Abdomen is nondistended, soft and nontender.  Extremities: No pedal edema. Skin: No rashes, lesions or ulcers  Data Reviewed:    Labs: Basic Metabolic Panel: Recent Labs  Lab October 02, 2019 1218 2019-10-02 1218 09/23/19 0145 09/23/19 0145 09/24/19 0140 09/24/19 0140 09/25/19 0341 09/26/19 0318  NA 136  --  137  --  137  --  139 138  K 4.2   < > 4.8   < > 5.0   < > 4.9 4.0  CL 101  --  105  --  106  --  105 100  CO2 24  --  24  --  25  --  26 27  GLUCOSE 126*  --  168*  --  116*  --  138* 138*  BUN 15  --  15  --  18  --  24* 22  CREATININE 0.84  --  0.72  --  0.70  --  0.74 0.78  CALCIUM 8.5*  --  8.2*  --  8.4*  --  8.4* 8.4*  MG  --   --  2.1  --  2.1  --  2.1 2.0  PHOS  --   --  2.9  --  2.6  --  2.9 2.6   < > = values in this interval not displayed.   GFR Estimated Creatinine Clearance: 126.6 mL/min (by C-G formula based on SCr of 0.78 mg/dL). Liver Function Tests: Recent Labs  Lab 2019-10-02 1218 09/23/19 0145 09/24/19 0140 09/25/19 0341 09/26/19 0318  AST 31 23 25 24 24   ALT 34 25 24 24 22   ALKPHOS 57 48 64 72 80  BILITOT 1.5* 0.5 0.8 0.7 0.7  PROT 7.3 6.0* 5.8* 6.0* 6.1*  ALBUMIN 3.7 3.2* 2.9* 3.1* 3.0*   No results for input(s): LIPASE, AMYLASE in the last 168 hours. No results for input(s): AMMONIA in the last 168 hours. Coagulation profile No results for input(s): INR, PROTIME in the last 168 hours. COVID-19 Labs  Recent Labs    09/24/19 0140 09/25/19 0341 09/26/19 0318  DDIMER 5.04* 6.80* 16.69*  FERRITIN 241 226 212  CRP 6.4* 2.9* 4.3*    No results found for: SARSCOV2NAA  CBC: Recent Labs  Lab 02-Oct-2019 1218 09/23/19 0145 09/24/19 0140 09/25/19 0341 09/26/19 0318  WBC 10.9* 8.2 12.8* 9.9 9.0  NEUTROABS 9.8* 7.8* 11.4* 9.0* 7.0  HGB 16.4 14.1 13.4 14.1 15.6  HCT 49.2 42.3 39.2 41.9 46.4  MCV 90.1 89.1 88.1 89.1 89.6  PLT 153 127*  138* 139* 128*   Cardiac Enzymes: No results for input(s): CKTOTAL, CKMB, CKMBINDEX, TROPONINI in the last 168 hours. BNP (last 3 results) No results for  input(s): PROBNP in the last 8760 hours. CBG: Recent Labs  Lab 09/24/19 2054 09/25/19 0742 09/25/19 1140 09/25/19 1656 09/25/19 2131  GLUCAP 188* 139* 168* 170* 139*   D-Dimer: Recent Labs    09/25/19 0341 09/26/19 0318  DDIMER 6.80* 16.69*   Hgb A1c: No results for input(s): HGBA1C in the last 72 hours. Lipid Profile: No results for input(s): CHOL, HDL, LDLCALC, TRIG, CHOLHDL, LDLDIRECT in the last 72 hours. Thyroid function studies: Recent Labs    09/26/19 0318  TSH 1.940   Anemia work up: Recent Labs    09/25/19 0341 09/26/19 0318  FERRITIN 226 212   Sepsis Labs: Recent Labs  Lab 09/21/2019 1218 09/17/2019 1222 10/01/2019 1439 09/25/2019 1641 10/06/2019 1753 09/23/19 0145 09/24/19 0140 09/25/19 0341 09/26/19 0318  PROCALCITON  --   --   --  0.15  --   --  0.15  --   --   WBC   < >  --   --   --   --  8.2 12.8* 9.9 9.0  LATICACIDVEN  --  2.3* 2.7*  --  1.1  --   --   --   --    < > = values in this interval not displayed.   Microbiology Recent Results (from the past 240 hour(s))  Culture, blood (routine x 2)     Status: None   Collection Time: 09/16/19 12:30 PM   Specimen: BLOOD LEFT FOREARM  Result Value Ref Range Status   Specimen Description   Final    BLOOD LEFT FOREARM Performed at [facility], [address], [place], [place] [address]    Special Requests   Final    BOTTLES DRAWN AEROBIC AND ANAEROBIC Blood Culture results may not be optimal due to an excessive volume of blood received in culture bottles Performed at [facility], [address] [place], [place] [address]    Culture   Final    NO GROWTH 5 DAYS Performed at [facility], [address] [place], [place] [address]    Report Status 09/21/2019 FINAL  Final  Culture, blood (routine x  2)     Status: None   Collection Time: 09/16/19 12:54 PM   Specimen: BLOOD RIGHT HAND  Result Value Ref Range Status   Specimen Description   Final    BLOOD RIGHT HAND Performed at [facility], [address], [place], [place] [address]    Special Requests   Final    AEB Blood Culture results may not be optimal due to an inadequate volume of blood received in culture bottles Performed at [facility], [address], [place], [place] [address]    Culture   Final    NO GROWTH 5 DAYS Performed at [facility], [address], [place], [place] [address]    Report Status 09/21/2019 FINAL  Final  Culture, blood (routine x 2)     Status: None (Preliminary result)   Collection Time: 10/04/2019 12:18 PM   Specimen: BLOOD  Result Value Ref Range Status   Specimen Description   Final    BLOOD RIGHT ANTECUBITAL Performed at [facility], [address], [place], [place] [address]    Special Requests   Final    BOTTLES DRAWN AEROBIC ONLY Blood Culture adequate volume Performed at [facility], [address] [place], [place] [address]    Culture   Final  NO GROWTH 4 DAYS Performed at [facility], [place] [address], [place], [place] [address]    Report Status PENDING  Incomplete  Culture, blood (routine x 2)     Status: None (Preliminary result)   Collection Time: 09/13/2019  2:39 PM   Specimen: BLOOD RIGHT HAND  Result Value Ref Range Status   Specimen Description   Final    BLOOD RIGHT HAND Performed at [place] [address], [place], [place] [address]    Special Requests   Final    BOTTLES DRAWN AEROBIC ONLY Blood Culture results may not be optimal due to an inadequate volume of blood received in culture bottles   Culture   Final    NO GROWTH 4 DAYS Performed at [facility], [place] [address], [place], [place] [address]    Report Status PENDING  Incomplete  Sputum  culture     Status: None   Collection Time: 09/24/19 12:00 PM   Specimen: Expectorated Sputum  Result Value Ref Range Status   Specimen Description EXPECTORATED SPUTUM  Final   Special Requests NONE  Final   Sputum evaluation   Final    THIS SPECIMEN IS ACCEPTABLE FOR SPUTUM CULTURE Performed at [facility], [place] [address] [place], [place] [address]    Report Status 09/24/2019 FINAL  Final  Culture, respiratory     Status: None (Preliminary result)   Collection Time: 09/24/19 12:00 PM  Result Value Ref Range Status   Specimen Description   Final    EXPECTORATED SPUTUM Performed at [place] [address] [place] [address], [place], [place] [address]    Special Requests   Final    NONE Reflexed from [phone] Performed at [place] [address], [place], [place] [address]    Gram Stain   Final    FEW WBC PRESENT, PREDOMINANTLY PMN MODERATE GRAM POSITIVE RODS MODERATE GRAM POSITIVE COCCI IN PAIRS IN CLUSTERS FEW GRAM NEGATIVE RODS    Culture   Final    CULTURE REINCUBATED FOR BETTER GROWTH Performed at [facility], [place] [address], [place], [place] [address]    Report Status PENDING  Incomplete     Medications:    apixaban  5 mg Oral BID   vitamin C  500 mg Oral Daily   furosemide  40 mg Oral Daily   insulin aspart  0-5 Units Subcutaneous QHS   insulin aspart  0-9 Units Subcutaneous TID WC   Ipratropium-Albuterol  1 puff Inhalation Q6H   latanoprost  1 drop Both Eyes Daily   oxymetazoline  1 spray Each Nare BID   pantoprazole  40 mg Oral Daily   zinc sulfate  220 mg Oral Daily   Continuous Infusions:  azithromycin 500 mg (09/25/19 1748)   cefTRIAXone (ROCEPHIN)  IV Stopped (09/25/19 2010)   diltiazem (CARDIZEM) infusion 15 mg/hr (09/26/19 0550)      LOS: 4 days   Charlynne Cousins  [facility]  09/26/2019, 7:42 AM

## 2019-09-26 NOTE — Progress Notes (Signed)
Patient's head repositioned and arms rotated.  Patient tolerated well.  No skin breakdown noted at this time. ETT remains secure.  

## 2019-09-26 NOTE — Progress Notes (Signed)
NAME:  Roy Lee, MRN:  [id number], DOB:  [birth year], LOS: 4 ADMISSION DATE:  10/04/2019, CONSULTATION DATE:  2/16 REFERRING MD:  Aileen Fass, CHIEF COMPLAINT:  Dyspnea   Brief History   64y/o male treated for COVID 19 pneumonia and PE in January, treated with actemra, steroids, antivirals and dc'd home on 3-5 L O2 and anticoagulation.  Returned to the ED 2/12 with worsening dyspnea and hypoxemia.  Moved to the ICU on 2/16 in the setting of worsening hypoxemia and atrial fibrillation with RVR.  History of present illness   64y/o male treated for COVID 19 on January 1/24, treated with actemra, steroids, antivirals and dc'd home 1/30 on 3-5 L O2 and anticoagualtion.  Returned to the ED 2/12 with worsening dyspnea and hypoxemia.  Heh as been treated with ceftriaxon-azithromycin. Moved to the ICU on 2/16 in the setting of worsening hypoxemia and atrial fibrillation with RVR.  He was unable to provide more history on my exam because of severe respiratory distress.  Past Medical History  Obesity Hypertension arthitis  Significant Hospital Events   1/24 original admission, treated with actemra, steroids, antivirals 1/30 d/c home on 3-5 L O2 2/12 return to ED, admission, treated with ceftriaxone/azithro 2/16 move to ICU, intubation, bronchoscopy   Consults:  PCCM  Procedures:  2/16 ETT >  2/16 L IJ CVL >   Significant Diagnostic Tests:  2/12 CT angiogram chest > no PE, bilateral infiltrates, nodular, progressed compared to 2/6 film 2/16 CT chest> significant progression of bilateral ground glass and consolidation compared to 2/12 film  Micro Data:  2/12 blood > negative 2/14 resp culture > GPC in pair, cluster, GPR, GNR 2/16 BAL >  2/16 BAL fungal >  2/16 BAL AFB >   Antimicrobials:  2/12  Ceftriaxone >  2/12  Azithro >   Interim history/subjective:  As above  Objective   Blood pressure 119/65, pulse 96, temperature 99 F (37.2 C), temperature source Oral, resp. rate  20, height _0  (1.803 m), weight 117.9 kg, SpO2 95 %.        Intake/Output Summary (Last 24 hours) at 09/26/2019 1007 Last data filed at 09/26/2019 0500 Gross per 24 hour  Intake 388.29 ml  Output 500 ml  Net -111.71 ml   Filed Weights   09/15/2019 1155  Weight: 117.9 kg    Examination:  General:  Severe respiratory distress, cyanosis HENT: NCAT OP clear PULM: crackles bilaterally B, normal effort CV: Tachycardic, no mgr GI: BS+, soft, nontender MSK: normal bulk and tone Neuro: awake, alert, MAEW  2/16 CXR > severe bilateral airspace disease  Resolved Hospital Problem list     Assessment & Plan:  Evolving ARDS nearly 20 days after original admission> based on progression in CT scan, this is presumably ongoing effect from COVID 19.  ddx includes acute pulmonary edema or secondary infection (influenza, bacterial pneumonia) Admit to ICU Intubate, mechanical ventilation Continue mechanical ventilation per ARDS protocol Target TVol 6-8cc/kgIBW Target Plateau Pressure < 30cm H20 Target driving pressure less than 15 cm of water Target PaO2 55-65: titrate PEEP/FiO2 per protocol As long as PaO2 to FiO2 ratio is less than 1:150 position in prone position for 16 hours a day Check CVP daily if CVL in place Target CVP less than 4, diurese as necessary Ventilator associated pneumonia prevention protocol Bronchoscopy, send bal for bacterial, fungal, AFB pathogens Continue ceftriaxone/azithro Send rapid influenza swab Hold steroids Agree with diuresis, lasix  Atrial fibrillation with RVR Continue diltiazem infusion May need  amiodarone Tele  PE eliquis  Need for sedation for mechanical ventilation, severe ventilator dyssynchrony Neuromuscular blockade protocol Fentanyl/versed RASS target -5  Best practice:  Diet: tube feeding Pain/Anxiety/Delirium protocol (if indicated): yes, RASS target -5, as above VAP protocol (if indicated): yes DVT prophylaxis: eliquis GI  prophylaxis: Pantoprazole for stress ulcer prophylaxis Glucose control: SSI Mobility: bed rest Code Status: full Family Communication: per [facility] Disposition: remain in ICU  Labs   CBC: Recent Labs  Lab 09/20/2019 1218 09/23/19 0145 09/24/19 0140 09/25/19 0341 09/26/19 0318  WBC 10.9* 8.2 12.8* 9.9 9.0  NEUTROABS 9.8* 7.8* 11.4* 9.0* 7.0  HGB 16.4 14.1 13.4 14.1 15.6  HCT 49.2 42.3 39.2 41.9 46.4  MCV 90.1 89.1 88.1 89.1 89.6  PLT 153 127* 138* 139* 128*    Basic Metabolic Panel: Recent Labs  Lab 09/21/2019 1218 09/23/19 0145 09/24/19 0140 09/25/19 0341 09/26/19 0318  NA 136 137 137 139 138  K 4.2 4.8 5.0 4.9 4.0  CL 101 105 106 105 100  CO2 _0 GLUCOSE 126* 168* 116* 138* 138*  BUN _1 24* 22  CREATININE 0.84 0.72 0.70 0.74 0.78  CALCIUM 8.5* 8.2* 8.4* 8.4* 8.4*  MG  --  2.1 2.1 2.1 2.0  PHOS  --  2.9 2.6 2.9 2.6   GFR: Estimated Creatinine Clearance: 126.6 mL/min (by C-G formula based on SCr of 0.78 mg/dL). Recent Labs  Lab 09/11/2019 1218 09/17/2019 1222 09/21/2019 1439 09/14/2019 1641 09/24/2019 1753 09/23/19 0145 09/24/19 0140 09/25/19 0341 09/26/19 0318  PROCALCITON  --   --   --  0.15  --   --  0.15  --   --   WBC   < >  --   --   --   --  8.2 12.8* 9.9 9.0  LATICACIDVEN  --  2.3* 2.7*  --  1.1  --   --   --   --    < > = values in this interval not displayed.    Liver Function Tests: Recent Labs  Lab 09/19/2019 1218 09/23/19 0145 09/24/19 0140 09/25/19 0341 09/26/19 0318  AST _2 ALT 34 _3 ALKPHOS 57 48 64 72 80  BILITOT 1.5* 0.5 0.8 0.7 0.7  PROT 7.3 6.0* 5.8* 6.0* 6.1*  ALBUMIN 3.7 3.2* 2.9* 3.1* 3.0*   No results for input(s): LIPASE, AMYLASE in the last 168 hours. No results for input(s): AMMONIA in the last 168 hours.  ABG    Component Value Date/Time   HCO3 26.2 09/15/2019 1219   O2SAT 48.3 10/03/2019 1219     Coagulation Profile: No results for input(s): INR, PROTIME in the last 168  hours.  Cardiac Enzymes: No results for input(s): CKTOTAL, CKMB, CKMBINDEX, TROPONINI in the last 168 hours.  HbA1C: Hgb A1c MFr Bld  Date/Time Value Ref Range Status  09/06/2019 11:40 AM 6.3 (H) 4.8 - 5.6 % Final    Comment:    (NOTE) Pre diabetes:          5.7%-6.4% Diabetes:              >6.4% Glycemic control for   <7.0% adults with diabetes     CBG: Recent Labs  Lab 09/25/19 0742 09/25/19 1140 09/25/19 1656 09/25/19 2131 09/26/19 0753  GLUCAP 139* 168* 170* 139* 95    Review of Systems:   Cannot obtain due to intubation  Past Medical History  He,  has  a past medical history of Arthritis, Hypertension, and Obesity.   Surgical History   History reviewed. No pertinent surgical history.   Social History   reports that he has never smoked. He has never used smokeless tobacco. He reports current alcohol use. He reports that he does not use drugs.   Family History   His family history is not on file.   Allergies No Known Allergies   Home Medications  Prior to Admission medications   Medication Sig Start Date End Date Taking? Authorizing Provider  apixaban (ELIQUIS) 5 MG TABS tablet Please take 10 mg oral twice daily for next 5 days, then transition to 5 mg oral twice daily on 09/14/2019 Patient taking differently: Take 5 mg by mouth 2 (two) times daily.  09/09/19  Yes Elgergawy, Silver Huguenin, MD  dextromethorphan-guaiFENesin (MUCINEX DM) 30-600 MG 12hr tablet Take 1 tablet by mouth 2 (two) times daily as needed for cough.   Yes [provider]  furosemide (LASIX) 20 MG tablet Take 1 tablet (20 mg total) by mouth daily. Patient taking differently: Take 40 mg by mouth daily.  09/16/19  Yes Valarie Merino, MD  ibuprofen (ADVIL) 200 MG tablet Take 600-800 mg by mouth every 6 (six) hours as needed for fever, headache or moderate pain.   Yes [provider]  latanoprost (XALATAN) 0.005 % ophthalmic solution Place 1 drop into both eyes daily. 07/31/19  Yes  [provider]  lisinopril (ZESTRIL) 40 MG tablet Take 40 mg by mouth daily.   Yes [provider]  Multiple Vitamin (MULTIVITAMIN WITH MINERALS) TABS tablet Take 1 tablet by mouth daily.   Yes [provider]  vitamin C (ASCORBIC ACID) 500 MG tablet Take 500 mg by mouth daily.   Yes [provider]  acetaminophen (TYLENOL) 325 MG tablet Take 2 tablets (650 mg total) by mouth every 6 (six) hours as needed for mild pain or headache (fever >/= 101). Patient not taking: Reported on 09/21/2019 09/09/19   Elgergawy, Silver Huguenin, MD  dexamethasone (DECADRON) 4 MG tablet Take 2 tablets (8 mg total) by mouth daily. Patient not taking: Reported on 09/25/2019 09/10/19   Elgergawy, Silver Huguenin, MD  pantoprazole (PROTONIX) 40 MG tablet Take 1 tablet (40 mg total) by mouth daily. Patient not taking: Reported on 10/06/2019 09/09/19 10/09/19  Elgergawy, Silver Huguenin, MD     Critical care time: 60 minutes     Roselie Awkward, MD [place] P[facility]M Pager: [phone] Cell: [phone] If no response, call [phone]

## 2019-09-26 NOTE — Procedures (Signed)
PCCM Video Bronchoscopy Procedure Note  The patient was informed of the risks (including but not limited to bleeding, infection, respiratory failure, lung injury, tooth/oral injury) and benefits of the procedure and gave consent, see chart.  Indication: worsening infiltrates of uncertain etiology  Post Procedure Diagnosis: ARDS due to COVID 19  Location: [facility]  Condition pre procedure: Critically ill, on ventilator  Medications for procedure: Versed, fentanyl, etomidate, rocuronium  Procedure description: The bronchoscope was introduced through the endotracheal tube and passed to the bilateral lungs to the level of the subsegmental bronchi throughout the tracheobronchial tree.  Airway exam of trachea and R tracheobronchial tree revealed normal appearing airways, no bleeding, no secretions, no masses.  There were scant bloody secretions in the left lower lobe.    Procedures performed: BAL RML> 20cc sterile saline injected, 12cc cloudy fluid removed  Specimens sent: BAL culture: bacterial, fungal, AFB  Condition post procedure: critically ill, on vent  EBL: none from procedure  Complications: none immediate  Roselie Awkward, MD [place] PCCM Pager: [phone] Cell: [phone] If no response, call [phone]

## 2019-09-26 NOTE — Progress Notes (Signed)
Updated daughter on pt condition

## 2019-09-26 NOTE — Progress Notes (Signed)
Patient's head repositioned and arms rotated.  Patient tolerated well.  No skin breakdown noted at this time. ETT remains secured.

## 2019-09-26 NOTE — Progress Notes (Addendum)
New onset afib, HR between 110s - 140s, pt denies CP and palpitations, pt does state he feels more SOB, notified doctor, waiting on call back, notified charge RN, notified rapid response  Update: Dr. Antionette Char assessed pt at bedside, metoprolol given and minimal change in HR, cardizem gtt now infusing

## 2019-09-26 NOTE — Progress Notes (Signed)
PT Cancellation Note  Patient Details Name: Roy Lee MRN: [id number] DOB: 25-Mar-1956   Cancelled Treatment:    Reason Eval/Treat Not Completed: Medical issues which prohibited therapy. PT has been moved to ICU will hold on tx today and f/u tomorrow.  Drema Pry, PT    [name] 09/26/2019, 1:20 PM

## 2019-09-26 NOTE — Procedures (Signed)
Central Venous Catheter Insertion Procedure Note Roy Lee [id number] 1955-10-15  Procedure: Insertion of Central Venous Catheter Indications: Drug and/or fluid administration  Procedure Details Consent: Risks of procedure as well as the alternatives and risks of each were explained to the (patient/caregiver).  Consent for procedure obtained. Time Out: Verified patient identification, verified procedure, site/side was marked, verified correct patient position, special equipment/implants available, medications/allergies/relevent history reviewed, required imaging and test results available.  Performed  Maximum sterile technique was used including antiseptics, cap, gloves, gown, hand hygiene, mask and sheet. Skin prep: Chlorhexidine; local anesthetic administered A antimicrobial bonded/coated triple lumen catheter was placed in the left internal jugular vein using the Seldinger technique.  The left subclavian vein was accessed but the wire would not pass  Ultrasound was used to verify the patency of the vein and for real time needle guidance.  Evaluation Blood flow good Complications: No apparent complications Patient did tolerate procedure well. Chest X-ray ordered to verify placement.  CXR: normal.  Roy Lee 09/26/2019, 1:30 PM

## 2019-09-26 NOTE — Progress Notes (Signed)
Pt proned at this time per MD request due to Peak Pressures being     >55 with no improvement despite suctioning, lavage, changing filters. Pt ett securely taped at 25 at the lip and is in proper position. Mepilex placed on pt's face to avoid breakdown and pt head placed on foam proning pillow. No change in peak pressure despite proning, pt placed in PC mode and MD made aware. ABG to be obtained at 1615

## 2019-09-26 NOTE — Progress Notes (Signed)
Spoke to pt's daughter Lanora Manis, updated on patient, status, answered all questions. No further concerns at this time

## 2019-09-26 NOTE — Progress Notes (Signed)
   Vital Signs MEWS/VS Documentation      09/26/2019 0500 09/26/2019 0550 09/26/2019 0623 09/26/2019 0701   MEWS Score:  1  3  3   0   MEWS Score Color:  Green  Yellow  Yellow  Green   Resp:  --  (!) 24  (!) 24  20   Pulse:  --  --  --  96   BP:  (!) 127/96  121/73  130/80  119/65   Temp:  --  --  --  99 F (37.2 C)   O2 Device:  --  HFNC;Non-rebreather Mask  --  HFNC;Non-rebreather Mask   O2 Flow Rate (L/min):  --  15 L/min  --  15 L/min   Level of Consciousness:  --  Alert  --  --      Pt mews went from green to red overnight, pt went from NSR to afib rvr, paged doctor, doctor assessed pt at bedside, ekg confirmed rhythm change, pt started on cardizem gtt, vital signs monitored and cardizem gtt titrated rest of night     09/26/2019,7:16 AM

## 2019-09-26 NOTE — Progress Notes (Signed)
Initial Nutrition Assessment  DOCUMENTATION CODES:   Obesity unspecified  INTERVENTION:    Vital AF 1.2 at 65 ml/h (1560 ml per day)   Pro-stat 30 ml TID   Provides 2172 kcal, 162 gm protein, 1265 ml free water daily   Free water flushes 150 ml every 4 hours for 2165 ml total free water intake per day.  NUTRITION DIAGNOSIS:   Increased nutrient needs related to acute illness, catabolic illness(COVID 19) as evidenced by estimated needs.  GOAL:   Patient will meet greater than or equal to 90% of their needs  MONITOR:   TF tolerance, Vent status, Labs  REASON FOR ASSESSMENT:   Ventilator, Consult Enteral/tube feeding initiation and management  ASSESSMENT:   64 yo male admitted 2/12 with worsening dyspnea and hypoxemia. Required intubation on 2/16. PMH includes COVID-19 PNA & PE in January 2021, obesity, HTN, arthritis.   S/P bronchoscopy and intubation today.  ARDS protocol.  Requiring NMB. Received MD Consult for TF initiation and management.  Patient is currently intubated on ventilator support MV: 11.8 L/min Temp (24hrs), Avg:98.6 F (37 C), Min:98 F (36.7 C), Max:99.1 F (37.3 C)   Labs reviewed.  CBG's: 95-256  Medications reviewed and include vitamin C, lasix, novolog, zinc, nimbex.   On last admission September 03, 2019, patient weighed 124.5 kg. Down to 117.9 kg on this admission. 5.3% weight loss within a month is significant for the time frame.   Suspect COVID symptoms have inhibited adequate oral intake at home.    NUTRITION - FOCUSED PHYSICAL EXAM:  unable to complete  Diet Order:   Diet Order            Diet NPO time specified  Diet effective now              EDUCATION NEEDS:   Not appropriate for education at this time  Skin:  Skin Assessment: Reviewed RN Assessment  Last BM:  2/14 type 6  Height:   Ht Readings from Last 1 Encounters:  10/03/2019 5\' 11"  (1.803 m)    Weight:   Wt Readings from Last 1 Encounters:   09/23/2019 117.9 kg    Ideal Body Weight:  78.2 kg  BMI:  Body mass index is 36.25 kg/m.  Estimated Nutritional Needs:   Kcal:  2100-2500  Protein:  >/= 156 gm  Fluid:  >/= 2.1 L    11/20/19, RD, LDN, CNSC Please refer to Amion for contact information.

## 2019-09-27 ENCOUNTER — Inpatient Hospital Stay (HOSPITAL_COMMUNITY): Payer: 59

## 2019-09-27 DIAGNOSIS — J9601 Acute respiratory failure with hypoxia: Secondary | ICD-10-CM

## 2019-09-27 LAB — POCT I-STAT 7, (LYTES, BLD GAS, ICA,H+H)
Acid-Base Excess: 9 mmol/L — ABNORMAL HIGH (ref 0.0–2.0)
Bicarbonate: 35.6 mmol/L — ABNORMAL HIGH (ref 20.0–28.0)
Calcium, Ion: 1.09 mmol/L — ABNORMAL LOW (ref 1.15–1.40)
Calcium, Ion: 1.14 mmol/L — ABNORMAL LOW (ref 1.15–1.40)
HCT: 39 % (ref 39.0–52.0)
HCT: 42 % (ref 39.0–52.0)
Hemoglobin: 13.3 g/dL (ref 13.0–17.0)
Hemoglobin: 14.3 g/dL (ref 13.0–17.0)
O2 Saturation: 94 %
Patient temperature: 98.5
Patient temperature: 97.9
Potassium: 4.7 mmol/L (ref 3.5–5.1)
Potassium: 4.2 mmol/L (ref 3.5–5.1)
Sodium: 137 mmol/L (ref 135–145)
Sodium: 139 mmol/L (ref 135–145)
TCO2: 37 mmol/L — ABNORMAL HIGH (ref 22–32)
pCO2 arterial: 59.1 mmHg — ABNORMAL HIGH (ref 32.0–48.0)
pCO2 arterial: >97 mmHg (ref 32.0–48.0)
pH, Arterial: 7.388 (ref 7.350–7.450)
pH, Arterial: 7.207 — ABNORMAL LOW (ref 7.350–7.450)
pO2, Arterial: 73 mmHg — ABNORMAL LOW (ref 83.0–108.0)
pO2, Arterial: 54 mmHg — ABNORMAL LOW (ref 83.0–108.0)

## 2019-09-27 LAB — FERRITIN: Ferritin: 224 ng/mL (ref 24–336)

## 2019-09-27 LAB — CBC WITH DIFFERENTIAL/PLATELET
Abs Immature Granulocytes: 0.07 10*3/uL (ref 0.00–0.07)
Basophils Absolute: 0 10*3/uL (ref 0.0–0.1)
Basophils Relative: 0 %
Eosinophils Absolute: 0.6 10*3/uL — ABNORMAL HIGH (ref 0.0–0.5)
Eosinophils Relative: 8 %
HCT: 39.4 % (ref 39.0–52.0)
Hemoglobin: 13 g/dL (ref 13.0–17.0)
Immature Granulocytes: 1 %
Lymphocytes Relative: 11 %
Lymphs Abs: 0.8 10*3/uL (ref 0.7–4.0)
MCH: 30.1 pg (ref 26.0–34.0)
MCHC: 33 g/dL (ref 30.0–36.0)
MCV: 91.2 fL (ref 80.0–100.0)
Monocytes Absolute: 0.2 10*3/uL (ref 0.1–1.0)
Monocytes Relative: 3 %
Neutro Abs: 5.9 10*3/uL (ref 1.7–7.7)
Neutrophils Relative %: 77 %
Platelets: 108 10*3/uL — ABNORMAL LOW (ref 150–400)
RBC: 4.32 MIL/uL (ref 4.22–5.81)
RDW: 15.7 % — ABNORMAL HIGH (ref 11.5–15.5)
WBC: 7.5 10*3/uL (ref 4.0–10.5)
nRBC: 0 % (ref 0.0–0.2)

## 2019-09-27 LAB — CULTURE, RESPIRATORY W GRAM STAIN: Culture: NORMAL

## 2019-09-27 LAB — COMPREHENSIVE METABOLIC PANEL
ALT: 20 U/L (ref 0–44)
AST: 17 U/L (ref 15–41)
Albumin: 2.4 g/dL — ABNORMAL LOW (ref 3.5–5.0)
Alkaline Phosphatase: 70 U/L (ref 38–126)
Anion gap: 8 (ref 5–15)
BUN: 31 mg/dL — ABNORMAL HIGH (ref 8–23)
CO2: 36 mmol/L — ABNORMAL HIGH (ref 22–32)
Calcium: 7.5 mg/dL — ABNORMAL LOW (ref 8.9–10.3)
Chloride: 94 mmol/L — ABNORMAL LOW (ref 98–111)
Creatinine, Ser: 0.79 mg/dL (ref 0.61–1.24)
GFR calc Af Amer: >60 mL/min (ref >60)
GFR calc non Af Amer: >60 mL/min (ref >60)
Glucose, Bld: 216 mg/dL — ABNORMAL HIGH (ref 70–99)
Potassium: 4.2 mmol/L (ref 3.5–5.1)
Sodium: 138 mmol/L (ref 135–145)
Total Bilirubin: 0.7 mg/dL (ref 0.3–1.2)
Total Protein: 5.1 g/dL — ABNORMAL LOW (ref 6.5–8.1)

## 2019-09-27 LAB — CULTURE, BLOOD (ROUTINE X 2)
Culture: NO GROWTH
Culture: NO GROWTH
Special Requests: ADEQUATE

## 2019-09-27 LAB — GLUCOSE, CAPILLARY
Glucose-Capillary: 230 mg/dL — ABNORMAL HIGH (ref 70–99)
Glucose-Capillary: 196 mg/dL — ABNORMAL HIGH (ref 70–99)
Glucose-Capillary: 216 mg/dL — ABNORMAL HIGH (ref 70–99)
Glucose-Capillary: 253 mg/dL — ABNORMAL HIGH (ref 70–99)
Glucose-Capillary: 312 mg/dL — ABNORMAL HIGH (ref 70–99)

## 2019-09-27 LAB — C-REACTIVE PROTEIN: CRP: 13.5 mg/dL — ABNORMAL HIGH (ref <1.0)

## 2019-09-27 LAB — ECHOCARDIOGRAM COMPLETE
Height: 71 in
Weight: 4158.76 oz

## 2019-09-27 LAB — MAGNESIUM
Magnesium: 2.1 mg/dL (ref 1.7–2.4)
Magnesium: 2.5 mg/dL — ABNORMAL HIGH (ref 1.7–2.4)

## 2019-09-27 LAB — D-DIMER, QUANTITATIVE: D-Dimer, Quant: >20 ug/mL-FEU — ABNORMAL HIGH (ref 0.00–0.50)

## 2019-09-27 LAB — PHOSPHORUS
Phosphorus: 3 mg/dL (ref 2.5–4.6)
Phosphorus: 5 mg/dL — ABNORMAL HIGH (ref 2.5–4.6)

## 2019-09-27 MED ORDER — PANTOPRAZOLE SODIUM 40 MG PO PACK
40.0000 mg | PACK | Freq: Every day | ORAL | Status: DC
  Administered 2019-09-27 – 2019-10-13 (×17): 40 mg
  Filled 2019-09-27 (×17): qty 20

## 2019-09-27 MED ORDER — FUROSEMIDE 10 MG/ML IJ SOLN
40.0000 mg | Freq: Four times a day (QID) | INTRAMUSCULAR | Status: AC
  Administered 2019-09-27 (×2): 40 mg via INTRAVENOUS
  Filled 2019-09-27 (×2): qty 4

## 2019-09-27 MED ORDER — INSULIN GLARGINE 100 UNIT/ML ~~LOC~~ SOLN
8.0000 [IU] | Freq: Two times a day (BID) | SUBCUTANEOUS | Status: DC
  Administered 2019-09-27 – 2019-09-30 (×8): 8 [IU] via SUBCUTANEOUS
  Filled 2019-09-27 (×9): qty 0.08

## 2019-09-27 MED ORDER — POTASSIUM CHLORIDE 20 MEQ/15ML (10%) PO SOLN
40.0000 meq | Freq: Once | ORAL | Status: AC
  Administered 2019-09-27: 11:00:00 40 meq
  Filled 2019-09-27: qty 30

## 2019-09-27 NOTE — Progress Notes (Signed)
  Echocardiogram 2D Echocardiogram has been performed.  Celene Skeen 09/27/2019, 1:15 PM

## 2019-09-27 NOTE — Progress Notes (Signed)
Pt placed proned at this time with no complications. VS within normal limits. ETT secured with tape in proper position.

## 2019-09-27 NOTE — Progress Notes (Signed)
Post supine ABG 7.20/>97/55 the rest was unmeasurable due to high CO2. CCM MD and NP made aware. Increased FiO2 to 70%. Pt will be re-proned around 1700. RN aware.

## 2019-09-27 NOTE — Progress Notes (Signed)
Critical ABG results called to CCM (McQuaid) Increased PC from 22 to 28. Will repeat ABG in one hour. Call results to [facility].

## 2019-09-27 NOTE — Progress Notes (Signed)
Patient's head repositioned and arms rotated.  Patient tolerated well.  No skin breakdown noted at this time. ETT remains secure.  

## 2019-09-27 NOTE — Progress Notes (Addendum)
Patient remains on sedation and paralytic.  He was laid supine this morning around 0800.  Shortly after his supine positioning he became tachycardic and sustained HR within the 120's.Some PVC's unifocal persisted throughout.  Started Cardizem drip and increased therapy up to 71m/hr. Patients vitals stabilized. Cleaned patient via bath with chg wipes and changed complete bedding.  UOP has been WDL.  Delivered two doses of lasix 425mIV push.Patient lung sounds improved from rochi in the lobes to diminishment.  ECHO was performed.  Findings are noted. Core trac feeding tube was placed and patient receiving vital 1.2 '@40ml' /hrd/t proning. Fentanyl running @ 10021mhr.  Versed 5mg8m, Nimbex 2.5mcg54m/min, and bicarb running at 125ml/47m CVP remains round 14.  BIS has increased to high 30's, currently 38.    Spoke with patients daughter this morning about patient therapy and condition and the purpose of proning.  Explained therapy of ventilator with ETT.

## 2019-09-27 NOTE — Procedures (Signed)
Cortrak  Person Inserting Tube:  Kendell Bane C, RD Tube Type:  Cortrak - 43 inches Tube Location:  Left nare Initial Placement:  Stomach Secured by: Bridle Technique Used to Measure Tube Placement:  Documented cm marking at nare/ corner of mouth Cortrak Secured At:  78 cm    Cortrak Tube Team Note:  Consult received to place a Cortrak feeding tube.   No x-ray is required. RN may begin using tube.    If the tube becomes dislodged please keep the tube and contact the Cortrak team at [email or web address] ([email or web address]) for replacement.  If after hours and replacement cannot be delayed, place a NG tube and confirm placement with an abdominal x-ray.    Cammy Copa., RD, LDN, CNSC See AMiON for contact information

## 2019-09-27 NOTE — Progress Notes (Signed)
Inpatient Diabetes Program Recommendations  AACE/ADA: New Consensus Statement on Inpatient Glycemic Control (2015)  Target Ranges:  Prepandial:   less than 140 mg/dL      Peak postprandial:   less than 180 mg/dL (1-2 hours)      Critically ill patients:  140 - 180 mg/dL   Lab Results  Component Value Date   GLUCAP 196 (H) 09/27/2019   HGBA1C 6.3 (H) 09/06/2019    Review of Glycemic Control  Diabetes history: None  Current orders for Inpatient glycemic control:  Novolog 0-9 units tid + hs  A1c 6.3% on 09/06/19 Vital AF 65 ml/hour  Inpatient Diabetes Program Recommendations:    - Change Novolog Correction to Q4 hours while NPO and on Tube Feeds.   - Add Novolog 2 units Q4 hours Tube Feed Coverage.  Thanks,  Christena Deem RN, MSN, BC-ADM Inpatient Diabetes Coordinator Team Pager [phone] (8a-5p)

## 2019-09-27 NOTE — Progress Notes (Signed)
eLink Physician-Brief Progress Note Patient Name: Roy Lee DOB: [birth year] MRN: [id number]   Date of Service  09/27/2019  HPI/Events of Note  Pt mechanically ventilated, proned, and paralyzed. RN requesting foley catheter order.  eICU Interventions  Foley catheter ordered.     Intervention Category Minor Interventions: Routine modifications to care plan (e.g. PRN medications for pain, fever)  Roy Lee U Niomi Valent 09/27/2019, 4:04 AM

## 2019-09-27 NOTE — Progress Notes (Signed)
Patient's head repositioned and arms rotated.  Patient tolerated well.  No skin breakdown noted at this time. ETT secured.

## 2019-09-27 NOTE — Progress Notes (Signed)
  ABG obtained at 00:57, results not crossed over in Epic. No critical values. PaO2 =73

## 2019-09-27 NOTE — Progress Notes (Addendum)
NAME:  Roy Lee, MRN:  [id number], DOB:  06-06-1956, LOS: 5 ADMISSION DATE:  09/12/2019, CONSULTATION DATE:  2/16 REFERRING MD:  Aileen Fass, CHIEF COMPLAINT:  Dyspnea   Brief History   64y/o male treated for COVID 19 pneumonia and PE in January, treated with actemra, steroids, antivirals and dc'd home on 3-5 L O2 and anticoagulation.  Returned to the ED 2/12 with worsening dyspnea and hypoxemia.  Moved to the ICU on 2/16 in the setting of worsening hypoxemia and atrial fibrillation with RVR.  History of present illness   65y/o male treated for COVID 19 on January 1/24, treated with actemra, steroids, antivirals and dc'd home 1/30 on 3-5 L O2 and anticoagualtion.  Returned to the ED 2/12 with worsening dyspnea and hypoxemia.  He as been treated with ceftriaxon-azithromycin. Moved to the ICU on 2/16 in the setting of worsening hypoxemia and atrial fibrillation with RVR.  He was unable to provide more history on my exam because of severe respiratory distress.  Past Medical History  Obesity Hypertension arthitis  Significant Hospital Events   1/24 original admission, treated with actemra, steroids, antivirals 1/30 d/c home on 3-5 L O2 2/12 return to ED, admission, treated with ceftriaxone/azithro 2/16 move to ICU, intubation, bronchoscopy prone position initiated, placed on neuromuscular blockade 2/17 sedated, paralyzed. Requiring total pressures 45-46 to achieve 33m/kg. CVP up. aPCV decreased to total pressure of 34. Getting echo.  Consults:  PCCM  Procedures:  2/16 ETT >  2/16 L IJ CVL >   Significant Diagnostic Tests:  2/12 CT angiogram chest > no PE, bilateral infiltrates, nodular, progressed compared to 2/6 film 2/16 CT chest> significant progression of bilateral ground glass and consolidation compared to 2/12 film  Micro Data:  2/12 blood > negative 2/14 resp culture > GPC in pair, cluster, GPR, GNR 2/16 BAL >  2/16 BAL fungal >  2/16 BAL AFB >   Antimicrobials:    2/12  Ceftriaxone >  2/12  Azithro >   Interim history/subjective:  Sedated and paralyzed   Objective   Blood pressure 116/87, pulse 81, temperature 97.9 F (36.6 C), temperature source Axillary, resp. rate (Abnormal) 34, height _0  (1.803 m), weight 117.9 kg, SpO2 96 %. CVP:  [8 mmHg-46 mmHg] 14 mmHg  Vent Mode: PCV FiO2 (%):  [50 %-100 %] 50 % Set Rate:  [28 bmp-35 bmp] 35 bmp Vt Set:  [520 mL] 520 mL PEEP:  [12 cmH20-14 cmH20] 12 cmH20 Plateau Pressure:  [41 cmH20-43 cmH20] 43 cmH20   Intake/Output Summary (Last 24 hours) at 09/27/2019 1013 Last data filed at 09/27/2019 0900 Gross per 24 hour  Intake 4741.53 ml  Output 1625 ml  Net 3116.53 ml   Filed Weights   09/25/2019 1155  Weight: 117.9 kg    Examination: General this is a 64year old male sedated on vent  Currently in the Prone position  HENT NCAT no JVD MMM orally intubated Pulm decreased t/o equal bilateral chest rise  Vent PCV 22/peep 12 fio2 50%  Card currently af w/ RVR no MRG abd soft not tender + bowel sounds Neuro sedated and paralyzed  gu clear yellow Ext brisk CR warm and dry   Resolved Hospital Problem list     Assessment & Plan:   Evolving ARDS nearly 20 days after original admission> based on progression in CT scan, this is presumably ongoing effect from COVID 19.  ddx includes acute pulmonary edema or secondary infection (influenza, bacterial pneumonia) Portable chest x-ray reviewed  from 2/16 demonstrates diffuse pulmonary infiltrates.  Endotracheal tubes in satisfactory position, left IJ triple-lumen catheter appears in satisfactory position, NG tube courses below the level of the diaphragm. FiO2 requirements coming down, remains on 14 of PEEP BAL negative today, no change in CBC, no fever spike, intake output balance 3.4 L positive CRP up from 4.3-13.5, D-dimer climbing greater than 20 Plan Cont low tidal Vol/lung protective ventilation (currently on PCV); decreased total driving pressure  to 34 from 45. -->currently ~ 32m/kg PBW VT Would accept some degree of permissive hypercarbia in trade for better lung mechanics.  Goal pao2 55-65  Goal sats > 88% Repeat abg and cxr now that supine. If p/f remains < 150 will cont proning protocol every 8 hrs for 16 hrs.  Cont bicarb gtt (as allowing permissive hypercarbia) shooting for PH > 7.2 Get ECHO Add lasix Cont heavy sedation and day 2 NMB F/u pending culture data Day 2 CTX and azith Holding steroids Not sure he would be a great candidate for ECMO this far out. Will see how he responds to above.   Atrial fibrillation with RVR Plan Cont dilt gtt Cont tele  Ensure K > 4 and Mg > 2  PE Plan Cont Eliquis   Hyperglycemia Plan ssi Adjust basal coverage  Best practice:  Diet: tube feeding Pain/Anxiety/Delirium protocol (if indicated): yes, RASS target -5, as above VAP protocol (if indicated): yes DVT prophylaxis: eliquis GI prophylaxis: Pantoprazole for stress ulcer prophylaxis Glucose control: SSI Mobility: bed rest Code Status: full Family Communication:via phone  Disposition: remain in ICU  Critical care time: 45 minutes    PErick ColaceACNP-BC LClaytonPager # [phone] # [phone] no answer

## 2019-09-27 NOTE — Progress Notes (Addendum)
Patient remains unresponsive.  He is on paralytics Nimbex @ 2.1mcg/kg/min.  He is also receiving fentanyl 172mcg/hr, Versed 5mg /hr, and BiCarb @ 156ml/hr.  Upon ausculation his lung sounds are ronchus in all lobes.  He has some diminishment especially in the Bilateral lower lobes.  32m is receiving ETT therapy 25cm @ lips on pressure control FiO2 60%, PEEP 14, RR 35, and PC above PEEP 30cmH2O.  Bis monitor in place with reading of 31. Heart sounds S1S2 can be heard but faint d/t patient prone position.  Peripheral pulses and cap refill WDL.  Patient foley in place and UOP Amber and clear.  Patient is receiving feeding tube of Vital 1.2 AF at 70ml/hr d/t prone position, however goal is 32ml/hr when not proned.  Reporting RN stated that he was proned to assist strengthening his PEEP.

## 2019-09-27 NOTE — Progress Notes (Signed)
Pt placed supine at this time with no complications, Ett remains securely taped in proper position. VS within normal limits

## 2019-09-28 DIAGNOSIS — R0902 Hypoxemia: Secondary | ICD-10-CM

## 2019-09-28 LAB — GLUCOSE, CAPILLARY
Glucose-Capillary: 112 mg/dL — ABNORMAL HIGH (ref 70–99)
Glucose-Capillary: 116 mg/dL — ABNORMAL HIGH (ref 70–99)
Glucose-Capillary: 151 mg/dL — ABNORMAL HIGH (ref 70–99)
Glucose-Capillary: 153 mg/dL — ABNORMAL HIGH (ref 70–99)
Glucose-Capillary: 158 mg/dL — ABNORMAL HIGH (ref 70–99)
Glucose-Capillary: 161 mg/dL — ABNORMAL HIGH (ref 70–99)
Glucose-Capillary: 249 mg/dL — ABNORMAL HIGH (ref 70–99)

## 2019-09-28 LAB — COMPREHENSIVE METABOLIC PANEL
ALT: 33 U/L (ref 0–44)
AST: 21 U/L (ref 15–41)
Albumin: 2.4 g/dL — ABNORMAL LOW (ref 3.5–5.0)
Alkaline Phosphatase: 72 U/L (ref 38–126)
Anion gap: 9 (ref 5–15)
BUN: 48 mg/dL — ABNORMAL HIGH (ref 8–23)
CO2: 42 mmol/L — ABNORMAL HIGH (ref 22–32)
Calcium: 7.8 mg/dL — ABNORMAL LOW (ref 8.9–10.3)
Chloride: 89 mmol/L — ABNORMAL LOW (ref 98–111)
Creatinine, Ser: 1.58 mg/dL — ABNORMAL HIGH (ref 0.61–1.24)
GFR calc Af Amer: 54 mL/min — ABNORMAL LOW (ref >60)
GFR calc non Af Amer: 47 mL/min — ABNORMAL LOW (ref >60)
Glucose, Bld: 158 mg/dL — ABNORMAL HIGH (ref 70–99)
Potassium: 5.1 mmol/L (ref 3.5–5.1)
Sodium: 140 mmol/L (ref 135–145)
Total Bilirubin: 0.8 mg/dL (ref 0.3–1.2)
Total Protein: 5.3 g/dL — ABNORMAL LOW (ref 6.5–8.1)

## 2019-09-28 LAB — POCT I-STAT 7, (LYTES, BLD GAS, ICA,H+H)
Acid-Base Excess: 16 mmol/L — ABNORMAL HIGH (ref 0.0–2.0)
Acid-Base Excess: 18 mmol/L — ABNORMAL HIGH (ref 0.0–2.0)
Bicarbonate: 43.9 mmol/L — ABNORMAL HIGH (ref 20.0–28.0)
Bicarbonate: 46.1 mmol/L — ABNORMAL HIGH (ref 20.0–28.0)
Calcium, Ion: 1.1 mmol/L — ABNORMAL LOW (ref 1.15–1.40)
Calcium, Ion: 1.08 mmol/L — ABNORMAL LOW (ref 1.15–1.40)
HCT: 37 % — ABNORMAL LOW (ref 39.0–52.0)
HCT: 35 % — ABNORMAL LOW (ref 39.0–52.0)
Hemoglobin: 12.6 g/dL — ABNORMAL LOW (ref 13.0–17.0)
Hemoglobin: 11.9 g/dL — ABNORMAL LOW (ref 13.0–17.0)
O2 Saturation: 97 %
O2 Saturation: 94 %
Patient temperature: 103.1
Patient temperature: 37.6
Potassium: 5.1 mmol/L (ref 3.5–5.1)
Potassium: 4.6 mmol/L (ref 3.5–5.1)
Sodium: 135 mmol/L (ref 135–145)
Sodium: 136 mmol/L (ref 135–145)
TCO2: 46 mmol/L — ABNORMAL HIGH (ref 22–32)
TCO2: 48 mmol/L — ABNORMAL HIGH (ref 22–32)
pCO2 arterial: 77.5 mmHg (ref 32.0–48.0)
pCO2 arterial: 77.1 mmHg (ref 32.0–48.0)
pH, Arterial: 7.372 (ref 7.350–7.450)
pH, Arterial: 7.387 (ref 7.350–7.450)
pO2, Arterial: 110 mmHg — ABNORMAL HIGH (ref 83.0–108.0)
pO2, Arterial: 78 mmHg — ABNORMAL LOW (ref 83.0–108.0)

## 2019-09-28 LAB — CULTURE, BAL-QUANTITATIVE W GRAM STAIN: Culture: NO GROWTH

## 2019-09-28 LAB — CBC
HCT: 40.6 % (ref 39.0–52.0)
Hemoglobin: 12.4 g/dL — ABNORMAL LOW (ref 13.0–17.0)
MCH: 29.7 pg (ref 26.0–34.0)
MCHC: 30.5 g/dL (ref 30.0–36.0)
MCV: 97.4 fL (ref 80.0–100.0)
Platelets: 123 10*3/uL — ABNORMAL LOW (ref 150–400)
RBC: 4.17 MIL/uL — ABNORMAL LOW (ref 4.22–5.81)
RDW: 15.3 % (ref 11.5–15.5)
WBC: 7.2 10*3/uL (ref 4.0–10.5)
nRBC: 0.3 % — ABNORMAL HIGH (ref 0.0–0.2)

## 2019-09-28 LAB — PROCALCITONIN: Procalcitonin: 3.8 ng/mL

## 2019-09-28 MED ORDER — VANCOMYCIN HCL 750 MG/150ML IV SOLN
750.0000 mg | Freq: Two times a day (BID) | INTRAVENOUS | Status: DC
  Administered 2019-09-29 – 2019-09-30 (×4): 750 mg via INTRAVENOUS
  Filled 2019-09-28 (×5): qty 150

## 2019-09-28 MED ORDER — PHENYLEPHRINE HCL-NACL 10-0.9 MG/250ML-% IV SOLN
0.0000 ug/min | INTRAVENOUS | Status: DC
  Administered 2019-09-28: 11:00:00 120 ug/min via INTRAVENOUS
  Administered 2019-09-28: 02:00:00 20 ug/min via INTRAVENOUS
  Administered 2019-09-28: 09:00:00 70 ug/min via INTRAVENOUS
  Administered 2019-09-28: 15:00:00 120 ug/min via INTRAVENOUS
  Administered 2019-09-28: 06:00:00 70 ug/min via INTRAVENOUS
  Administered 2019-09-28 (×3): 120 ug/min via INTRAVENOUS
  Filled 2019-09-28 (×9): qty 250

## 2019-09-28 MED ORDER — PHENYLEPHRINE CONCENTRATED 100MG/250ML (0.4 MG/ML) INFUSION SIMPLE
0.0000 ug/min | INTRAVENOUS | Status: DC
  Administered 2019-09-28: 130 ug/min via INTRAVENOUS
  Administered 2019-09-29: 200 ug/min via INTRAVENOUS
  Administered 2019-09-29 – 2019-09-30 (×2): 150 ug/min via INTRAVENOUS
  Filled 2019-09-28 (×4): qty 250

## 2019-09-28 MED ORDER — APIXABAN 5 MG PO TABS
5.0000 mg | ORAL_TABLET | Freq: Two times a day (BID) | ORAL | Status: DC
  Administered 2019-09-28 – 2019-09-29 (×2): 5 mg
  Filled 2019-09-28 (×2): qty 1

## 2019-09-28 MED ORDER — METOPROLOL TARTRATE 25 MG PO TABS
25.0000 mg | ORAL_TABLET | Freq: Two times a day (BID) | ORAL | Status: DC
  Administered 2019-09-28: 21:00:00 25 mg
  Filled 2019-09-28: qty 1

## 2019-09-28 MED ORDER — VANCOMYCIN HCL 10 G IV SOLR
2500.0000 mg | Freq: Once | INTRAVENOUS | Status: AC
  Administered 2019-09-28: 2500 mg via INTRAVENOUS
  Filled 2019-09-28: qty 2000

## 2019-09-28 MED ORDER — ZINC SULFATE 220 (50 ZN) MG PO CAPS
220.0000 mg | ORAL_CAPSULE | Freq: Every day | ORAL | Status: DC
  Administered 2019-09-29 – 2019-10-10 (×12): 220 mg
  Filled 2019-09-28 (×12): qty 1

## 2019-09-28 MED ORDER — ASCORBIC ACID 500 MG PO TABS
500.0000 mg | ORAL_TABLET | Freq: Every day | ORAL | Status: DC
  Administered 2019-09-29 – 2019-10-10 (×12): 500 mg
  Filled 2019-09-28 (×15): qty 1

## 2019-09-28 MED ORDER — VASOPRESSIN 20 UNIT/ML IV SOLN
0.0400 [IU]/min | INTRAVENOUS | Status: DC
  Administered 2019-09-28 – 2019-09-29 (×2): 0.03 [IU]/min via INTRAVENOUS
  Administered 2019-09-30: 02:00:00 0.04 [IU]/min via INTRAVENOUS
  Filled 2019-09-28 (×4): qty 2

## 2019-09-28 MED ORDER — ACETAMINOPHEN 160 MG/5ML PO SOLN
650.0000 mg | ORAL | Status: DC | PRN
  Administered 2019-09-28 – 2019-10-08 (×12): 650 mg
  Filled 2019-09-28 (×13): qty 20.3

## 2019-09-28 MED ORDER — SODIUM CHLORIDE 0.9 % IV SOLN
2.0000 g | Freq: Three times a day (TID) | INTRAVENOUS | Status: DC
  Administered 2019-09-28 – 2019-10-03 (×15): 2 g via INTRAVENOUS
  Filled 2019-09-28 (×14): qty 2

## 2019-09-28 NOTE — Progress Notes (Signed)
Patient placed in supine position by RT x 1, MD x 1, RN x 2, and Pharmacist x 1 without complications.  ETT remains secured at 25 cm.

## 2019-09-28 NOTE — Progress Notes (Addendum)
Pt's head turned to the right without event. ETT secured throughout 

## 2019-09-28 NOTE — Progress Notes (Signed)
Patient's head repositioned and arms rotated.  Patient tolerated well.  No skin breakdown noted at this time. ETT remains secure.  

## 2019-09-28 NOTE — Progress Notes (Signed)
ABG results given to RN:  Ph 7.37 Co2 77 Pao2 110 Hco3 43

## 2019-09-28 NOTE — Progress Notes (Signed)
Pt's head turned to the left without event. ETT secured throughout.

## 2019-09-28 NOTE — Progress Notes (Signed)
Critical ABG called to Elink.  PH- 7.21 Co2 >97 Po2 72  No changes at this time.

## 2019-09-28 NOTE — Progress Notes (Signed)
eLink Physician-Brief Progress Note Patient Name: Roy Lee DOB: 06/23/1956 MRN: [id number]   Date of Service  09/28/2019  HPI/Events of Note  Pt went into Afib with RVR 150's, SBP 105, Lopressor 2.5 mg iv x 1 brought the rate down to 120's. He did this a few days ago and responded to a Diltiazem infusion.  eICU Interventions  Diltiazem infusion ordered.        Roy Lee 09/28/2019, 7:57 PM

## 2019-09-28 NOTE — Progress Notes (Signed)
eLink Physician-Brief Progress Note Patient Name: Roy Lee DOB: 06-Jun-1956 MRN: [id number]   Date of Service  09/28/2019  HPI/Events of Note  ABG reviewed, airway pressures and respiratory rate limit adjustments that can be made, Pt is much more synchronous with ventilator. Additional problems are a soft blood pressure and a temp spike to 39.4.  eICU Interventions  Will recheck ABG at 4 a.m., Phenylephrine infusion, Tylenol prn fever.        Thomasene Lot Damita Eppard 09/28/2019, 1:33 AM

## 2019-09-28 NOTE — Progress Notes (Signed)
Pharmacy Antibiotic Note  Roy Lee is a 64 y.o. male admitted on 09/15/2019 with COVID-19 pneumonia now with concern for sepsis from bacterial infection.  Pharmacy has been consulted for Cefepime and vancomycin dosing. Of note, patient has been spiking significant fevers with increasing procalcitonin. WBC remains wnl. Will plan to culture today prior to first dose of antibiotic   Plan: -Cefepime 2 gm IV Q 8 hours -Vancomycin 2.5 gm IV once then start Vancomycin 750 mg IV Q 12 hrs. Goal AUC 400-550. Expected AUC: 518 SCr used: 1.58 -Monitor CBC, renal fx, cultures and clinical progress -Vanc level as indicated    Height: _0  (180.3 cm) Weight: 268 lb 4.8 oz (121.7 kg) IBW/kg (Calculated) : 75.3  Temp (24hrs), Avg:102.4 F (39.1 C), Min:98.1 F (36.7 C), Max:103.5 F (39.7 C)  Recent Labs  Lab 09/26/2019 1222 09/26/2019 1439 09/12/2019 1753 09/23/19 0145 09/24/19 0140 09/25/19 0341 09/26/19 0318 09/27/19 0500 09/28/19 0445  WBC  --   --   --    < > 12.8* 9.9 9.0 7.5 7.2  CREATININE  --   --   --    < > 0.70 0.74 0.78 0.79 1.58*  LATICACIDVEN 2.3* 2.7* 1.1  --   --   --   --   --   --    < > = values in this interval not displayed.    Estimated Creatinine Clearance: 65.2 mL/min (A) (by C-G formula based on SCr of 1.58 mg/dL (H)).    No Known Allergies  Antimicrobials this admission: Cefepime 2/18 >>  Vanc 2/18 >>   Dose adjustments this admission:  Microbiology results: 2/12 BCx: neg  2/16 BAL: neg   2/16 MRSA PCR: neg  Thank you for allowing pharmacy to be a part of this patient's care.  Albertina Parr, PharmD., BCPS Clinical Pharmacist Clinical phone for 09/28/19 until 5pm: [phone]

## 2019-09-28 NOTE — Progress Notes (Signed)
PT Cancellation Note  Patient Details Name: Roy Lee MRN: [id number] DOB: [birth year]   Cancelled Treatment:    Reason Eval/Treat Not Completed: Medical issues which prohibited therapy.  Pt remains critically ill, vented and paralyzed.  Vent settings remain too high for PT assessment.  PT will follow acutely to see if pt stabilized and can tolerate therapy even at a bed level.   Thanks,  Corinna Capra, PT, DPT  Acute Rehabilitation [phone] pager [phone] office  @ [facility]: [phone]     Dimas Aguas 09/28/2019, 8:26 AM

## 2019-09-28 NOTE — Progress Notes (Signed)
eLink Physician-Brief Progress Note Patient Name: Roy Lee DOB: 06/03/1956 MRN: [id number]   Date of Service  09/28/2019  HPI/Events of Note  PH 7.37, Bicarb 43.9  eICU Interventions  Sodium bicarbonate infusion discontinued.        [name] 09/28/2019, 5:41 AM

## 2019-09-28 NOTE — Progress Notes (Signed)
Updated daughters via phone conference call.

## 2019-09-28 NOTE — Progress Notes (Addendum)
NAME:  Roy Lee, MRN:  [id number], DOB:  29-Mar-1956, LOS: 6 ADMISSION DATE:  09/29/2019, CONSULTATION DATE:  2/16 REFERRING MD:  Aileen Fass, CHIEF COMPLAINT:  Dyspnea   Brief History   64y/o male treated for COVID 19 pneumonia and PE in January, treated with actemra, steroids, antivirals and dc'd home on 3-5 L O2 and anticoagulation.  Returned to the ED 2/12 with worsening dyspnea and hypoxemia.  Moved to the ICU on 2/16 in the setting of worsening hypoxemia and atrial fibrillation with RVR.  History of present illness   64y/o male treated for COVID 19 on January 1/24, treated with actemra, steroids, antivirals and dc'd home 1/30 on 3-5 L O2 and anticoagualtion.  Returned to the ED 2/12 with worsening dyspnea and hypoxemia.  He as been treated with ceftriaxon-azithromycin. Moved to the ICU on 2/16 in the setting of worsening hypoxemia and atrial fibrillation with RVR.  He was unable to provide more history on my exam because of severe respiratory distress.  Past Medical History  Obesity Hypertension arthitis  Significant Hospital Events   1/24 original admission, treated with actemra, steroids, antivirals 1/30 d/c home on 3-5 L O2 2/12 return to ED, admission, treated with ceftriaxone/azithro 2/16 move to ICU, intubation, bronchoscopy prone position initiated, placed on neuromuscular blockade 2/17 sedated, paralyzed. Requiring total pressures 45-46 to achieve 59m/kg. CVP up. aPCV decreased to total pressure of 34. Getting echo.  Consults:  PCCM  Procedures:  2/16 ETT >  2/16 L IJ CVL >   Significant Diagnostic Tests:  2/12 CT angiogram chest > no PE, bilateral infiltrates, nodular, progressed compared to 2/6 film 2/16 CT chest> significant progression of bilateral ground glass and consolidation compared to 2/12 film 2/17 echo: limited study, LVEF 55-60%, RV overload with mod elevated PAP  Micro Data:  2/12 blood > negative 2/14 resp culture > GPC in pair, cluster, GPR,  GNR 2/16 BAL >  2/16 BAL fungal >  2/16 BAL AFB >   Antimicrobials:  2/12  Ceftriaxone > 2/16 2/12  Azithro > 2/16  Interim history/subjective:  2/18: tmax 103.5 over past 24h. Elevated pct will pan cx and starrt empiric abx.  2/17:Sedated and paralyzed   Objective   Blood pressure 91/66, pulse (!) 103, temperature (!) 101.5 F (38.6 C), temperature source Rectal, resp. rate (!) 35, height _0  (1.803 m), weight 121.7 kg, SpO2 95 %. CVP:  [11 mmHg-23 mmHg] 16 mmHg  Vent Mode: PCV FiO2 (%):  [60 %-80 %] 80 % Set Rate:  [35 bmp] 35 bmp PEEP:  [12 cmH20] 12 cmH20 Plateau Pressure:  [32 cmH20-38 cmH20] 38 cmH20   Intake/Output Summary (Last 24 hours) at 09/28/2019 0841 Last data filed at 09/28/2019 0800 Gross per 24 hour  Intake 4701.82 ml  Output 1110 ml  Net 3591.82 ml   Filed Weights   09/20/2019 1155 09/28/19 0500  Weight: 117.9 kg 121.7 kg    Examination: General this is a 64year old male sedated on vent  Currently in the Prone position  HENT NCAT no JVD MMM orally intubated Pulm decreased t/o equal bilateral chest rise  Vent PCV 22/peep 12 fio2 50%  Card currently sinus tach on tele abd soft not tender prone position Neuro sedated and paralyzed  gu clear yellow Ext brisk CR warm and dry   Resolved Hospital Problem list     Assessment & Plan:   Evolving ARDS nearly 20 days after original admission> based on progression in CT scan, this is  presumably ongoing effect from COVID 19.  ddx includes acute pulmonary edema or secondary infection (influenza, bacterial pneumonia) FiO2 requirements coming down, remains on 14 of PEEP Plan Cont low tidal Vol/lung protective ventilation (currently on PCV); decreased total driving pressure to 34 from 45. -->currently ~ 64m/kg PBW VT Would accept some degree of permissive hypercarbia in trade for better lung mechanics.  Goal pao2 55-65  Goal sats > 88%  If p/f remains < 150 will cont proning protocol every 8 hrs for 16 hrs.    -Stopped bicarb overnight with rising bicarb on bmp -Echo c/w rh overload, LV appears good (technically limited) -hold lasix right now -remains on esclated sedation and paralytic -repeat cx today with temp -Completed 5 days of  CTX and azith on 2/16 -tmax overnight 103.5, wbc remains low. Send pct.  -Holding steroids Not sure he would be a great candidate for ECMO this far out. Will see how he responds to above.  Sepsis with shock:  2/2 above -pancx at this time -resume abx but broaden to hcap -pct elevated from 2 days ago.  -adding vasopressin to neo (at 1172m) for map >65  Atrial fibrillation with RVR Plan -off dilt gtt Cont tele  Ensure K > 4 and Mg > 2 Arf: -cr with doubling overnight.  -follow indices.  -uop marginal considering lasix dosing, but still ~1.5L -hold further lasix at this time but consider if able to wean pressos.   H/o PE Plan Cont Eliquis  -elevated ddimer but 2ctpa negative during this admission  Prediabetes with Hyperglycemia Plan ssi Adjust basal coverage a1c 6.3  Thrombocytopenia:  -stable to slightly improved.  -remains on full dose a/c -follow, no overt bleeding at this time.   Best practice:  Diet: tube feeding Pain/Anxiety/Delirium protocol (if indicated): yes, RASS target -5, as above VAP protocol (if indicated): yes DVT prophylaxis: eliquis GI prophylaxis: Pantoprazole for stress ulcer prophylaxis Glucose control: SSI Mobility: bed rest Code Status: full Family Communication:via phone  Disposition: remain in ICU  Critical care time: The patient is critically ill with multiple organ systems failure and requires high complexity decision making for assessment and support, frequent evaluation and titration of therapies, application of advanced monitoring technologies and extensive interpretation of multiple databases.  Critical care time 54 mins. This represents my time independent of the NPs time taking care of the pt. This is  excluding procedures.    JeWoodlynneulmonary and Critical Care 09/28/2019, 8:42 AM

## 2019-09-28 NOTE — Progress Notes (Signed)
0115 Called [facility], spoke to RN and notified of patient low BP, cardizem gtt placed on hold and temp of 103.1 rectal, she will speak to Dr. Warrick Parisian. Ice packs placed in groin and axilla.

## 2019-09-28 NOTE — Progress Notes (Signed)
Patient placed in prone position by RT x 2 and RN x 3 without any complications.

## 2019-09-29 ENCOUNTER — Inpatient Hospital Stay (HOSPITAL_COMMUNITY): Payer: 59

## 2019-09-29 DIAGNOSIS — R0603 Acute respiratory distress: Secondary | ICD-10-CM

## 2019-09-29 LAB — COMPREHENSIVE METABOLIC PANEL
ALT: 92 U/L — ABNORMAL HIGH (ref 0–44)
AST: 69 U/L — ABNORMAL HIGH (ref 15–41)
Albumin: 2.2 g/dL — ABNORMAL LOW (ref 3.5–5.0)
Alkaline Phosphatase: 96 U/L (ref 38–126)
Anion gap: 7 (ref 5–15)
BUN: 57 mg/dL — ABNORMAL HIGH (ref 8–23)
CO2: 38 mmol/L — ABNORMAL HIGH (ref 22–32)
Calcium: 7.5 mg/dL — ABNORMAL LOW (ref 8.9–10.3)
Chloride: 92 mmol/L — ABNORMAL LOW (ref 98–111)
Creatinine, Ser: 1.12 mg/dL (ref 0.61–1.24)
GFR calc Af Amer: >60 mL/min (ref >60)
GFR calc non Af Amer: >60 mL/min (ref >60)
Glucose, Bld: 231 mg/dL — ABNORMAL HIGH (ref 70–99)
Potassium: 5.6 mmol/L — ABNORMAL HIGH (ref 3.5–5.1)
Sodium: 137 mmol/L (ref 135–145)
Total Bilirubin: 0.4 mg/dL (ref 0.3–1.2)
Total Protein: 4.8 g/dL — ABNORMAL LOW (ref 6.5–8.1)

## 2019-09-29 LAB — POCT I-STAT 7, (LYTES, BLD GAS, ICA,H+H)
Acid-Base Excess: 10 mmol/L — ABNORMAL HIGH (ref 0.0–2.0)
Bicarbonate: 37.6 mmol/L — ABNORMAL HIGH (ref 20.0–28.0)
Calcium, Ion: 1.1 mmol/L — ABNORMAL LOW (ref 1.15–1.40)
Calcium, Ion: 1.14 mmol/L — ABNORMAL LOW (ref 1.15–1.40)
Calcium, Ion: 1.1 mmol/L — ABNORMAL LOW (ref 1.15–1.40)
HCT: 38 % — ABNORMAL LOW (ref 39.0–52.0)
HCT: 43 % (ref 39.0–52.0)
HCT: 37 % — ABNORMAL LOW (ref 39.0–52.0)
Hemoglobin: 12.9 g/dL — ABNORMAL LOW (ref 13.0–17.0)
Hemoglobin: 14.6 g/dL (ref 13.0–17.0)
Hemoglobin: 12.6 g/dL — ABNORMAL LOW (ref 13.0–17.0)
O2 Saturation: 92 %
Patient temperature: 100.3
Patient temperature: 101
Patient temperature: 37.2
Potassium: 5.5 mmol/L — ABNORMAL HIGH (ref 3.5–5.1)
Potassium: 5.7 mmol/L — ABNORMAL HIGH (ref 3.5–5.1)
Potassium: 5 mmol/L (ref 3.5–5.1)
Sodium: 136 mmol/L (ref 135–145)
Sodium: 137 mmol/L (ref 135–145)
Sodium: 136 mmol/L (ref 135–145)
TCO2: 40 mmol/L — ABNORMAL HIGH (ref 22–32)
pCO2 arterial: >97 mmHg (ref 32.0–48.0)
pCO2 arterial: >97 mmHg (ref 32.0–48.0)
pCO2 arterial: 67.1 mmHg (ref 32.0–48.0)
pH, Arterial: 7.04 — CL (ref 7.350–7.450)
pH, Arterial: 7.211 — ABNORMAL LOW (ref 7.350–7.450)
pH, Arterial: 7.358 (ref 7.350–7.450)
pO2, Arterial: 72 mmHg — ABNORMAL LOW (ref 83.0–108.0)
pO2, Arterial: 91 mmHg (ref 83.0–108.0)
pO2, Arterial: 72 mmHg — ABNORMAL LOW (ref 83.0–108.0)

## 2019-09-29 LAB — CBC
HCT: 40.5 % (ref 39.0–52.0)
Hemoglobin: 12.5 g/dL — ABNORMAL LOW (ref 13.0–17.0)
MCH: 30.8 pg (ref 26.0–34.0)
MCHC: 30.9 g/dL (ref 30.0–36.0)
MCV: 99.8 fL (ref 80.0–100.0)
Platelets: 162 10*3/uL (ref 150–400)
RBC: 4.06 MIL/uL — ABNORMAL LOW (ref 4.22–5.81)
RDW: 15.6 % — ABNORMAL HIGH (ref 11.5–15.5)
WBC: 10.5 10*3/uL (ref 4.0–10.5)
nRBC: 0 % (ref 0.0–0.2)

## 2019-09-29 LAB — URINE CULTURE: Culture: NO GROWTH

## 2019-09-29 LAB — GLUCOSE, CAPILLARY
Glucose-Capillary: 195 mg/dL — ABNORMAL HIGH (ref 70–99)
Glucose-Capillary: 153 mg/dL — ABNORMAL HIGH (ref 70–99)
Glucose-Capillary: 203 mg/dL — ABNORMAL HIGH (ref 70–99)
Glucose-Capillary: 207 mg/dL — ABNORMAL HIGH (ref 70–99)
Glucose-Capillary: 213 mg/dL — ABNORMAL HIGH (ref 70–99)

## 2019-09-29 LAB — PROCALCITONIN: Procalcitonin: 2.21 ng/mL

## 2019-09-29 MED ORDER — SODIUM ZIRCONIUM CYCLOSILICATE 10 G PO PACK
10.0000 g | PACK | Freq: Once | ORAL | Status: AC
  Administered 2019-09-29: 12:00:00 10 g via ORAL
  Filled 2019-09-29: qty 1

## 2019-09-29 MED ORDER — HEPARIN (PORCINE) 25000 UT/250ML-% IV SOLN
1800.0000 [IU]/h | INTRAVENOUS | Status: DC
  Administered 2019-09-29: 22:00:00 1500 [IU]/h via INTRAVENOUS
  Administered 2019-09-30: 1650 [IU]/h via INTRAVENOUS
  Administered 2019-10-01: 05:00:00 1800 [IU]/h via INTRAVENOUS
  Filled 2019-09-29 (×3): qty 250

## 2019-09-29 MED ORDER — HYDROCORTISONE NA SUCCINATE PF 100 MG IJ SOLR
100.0000 mg | Freq: Three times a day (TID) | INTRAMUSCULAR | Status: DC
  Administered 2019-09-29 – 2019-10-03 (×13): 100 mg via INTRAVENOUS
  Filled 2019-09-29 (×13): qty 2

## 2019-09-29 NOTE — Progress Notes (Signed)
ANTICOAGULATION CONSULT NOTE - Initial Consult  Pharmacy Consult for heparin (apixaban PTA)  Indication: H/o PE   No Known Allergies  Patient Measurements: Height: 5\' 11"  (180.3 cm) Weight: 268 lb 4.8 oz (121.7 kg) IBW/kg (Calculated) : 75.3 Heparin Dosing Weight: 101 kg   Vital Signs: Temp: 101.7 F (38.7 C) (02/19 1015) Temp Source: Esophageal (02/19 0730) BP: 129/69 (02/19 1015) Pulse Rate: 93 (02/19 1015)  Labs: Recent Labs    09/27/19 0500 09/27/19 1110 09/28/19 0445 09/28/19 0445 09/28/19 1253 09/29/19 0801  HGB 13.0   < > 12.4*   < > 11.9* 12.5*  HCT 39.4   < > 40.6  --  35.0* 40.5  PLT 108*  --  123*  --   --  162  CREATININE 0.79  --  1.58*  --   --  1.12   < > = values in this interval not displayed.    Estimated Creatinine Clearance: 92 mL/min (by C-G formula based on SCr of 1.12 mg/dL).   Medical History: Past Medical History:  Diagnosis Date  . Arthritis   . Hypertension   . Obesity     Medications:  Medications Prior to Admission  Medication Sig Dispense Refill Last Dose  . apixaban (ELIQUIS) 5 MG TABS tablet Please take 10 mg oral twice daily for next 5 days, then transition to 5 mg oral twice daily on 09/14/2019 (Patient taking differently: Take 5 mg by mouth 2 (two) times daily. ) 70 tablet 0 Sep 26, 2019 at 830am  . dextromethorphan-guaiFENesin (MUCINEX DM) 30-600 MG 12hr tablet Take 1 tablet by mouth 2 (two) times daily as needed for cough.   Sep 26, 2019 at Unknown time  . furosemide (LASIX) 20 MG tablet Take 1 tablet (20 mg total) by mouth daily. (Patient taking differently: Take 40 mg by mouth daily. ) 30 tablet 1 09/26/19 at Unknown time  . ibuprofen (ADVIL) 200 MG tablet Take 600-800 mg by mouth every 6 (six) hours as needed for fever, headache or moderate pain.   Past Month at Unknown time  . latanoprost (XALATAN) 0.005 % ophthalmic solution Place 1 drop into both eyes daily.   Sep 26, 2019 at Unknown time  . lisinopril (ZESTRIL) 40 MG tablet Take  40 mg by mouth daily.   09-26-2019 at Unknown time  . Multiple Vitamin (MULTIVITAMIN WITH MINERALS) TABS tablet Take 1 tablet by mouth daily.   2019-09-26 at Unknown time  . vitamin C (ASCORBIC ACID) 500 MG tablet Take 500 mg by mouth daily.   September 26, 2019 at Unknown time  . acetaminophen (TYLENOL) 325 MG tablet Take 2 tablets (650 mg total) by mouth every 6 (six) hours as needed for mild pain or headache (fever >/= 101). (Patient not taking: Reported on 26-Sep-2019)   Not Taking at Unknown time  . dexamethasone (DECADRON) 4 MG tablet Take 2 tablets (8 mg total) by mouth daily. (Patient not taking: Reported on 09-26-2019) 10 tablet 0 Not Taking at Unknown time  . pantoprazole (PROTONIX) 40 MG tablet Take 1 tablet (40 mg total) by mouth daily. (Patient not taking: Reported on Sep 26, 2019) 30 tablet 0 Not Taking at Unknown time    Assessment: 76 YOM with COVID continued on home apixaban for a h/o PE. Pharmacy consulted to transition to IV heparin. Of note, patient received a dose of apixaban this AM.   H/H low stable. Plt normalized. No s/s of overt bleeding   Goal of Therapy:  Heparin level 0.3-0.7 units/ml aPTT 66-102 seconds Monitor platelets by anticoagulation protocol: Yes  Plan:  -D/c apixaban  -Start IV heparin at 1500 units/hr. No bolus  -F/u 6 hr aPTT -Monitor daily aPTT, HL, CBC and s/s of bleeding   Vinnie Level, PharmD., BCPS Clinical Pharmacist Clinical phone for 09/29/19 until 5pm: [phone]

## 2019-09-29 NOTE — Progress Notes (Signed)
Patient transferred to prone patient. Patient remained hemodynamically stable during turn.

## 2019-09-29 NOTE — Progress Notes (Signed)
Pt prone at this time with no complications by 2 RTs and  3 RNs. Foam pads already in place. ETT secured at 25 at the lips with cloth tape.

## 2019-09-29 NOTE — Progress Notes (Signed)
Patient placed in supine position.  ETT secured.  No breakdown noted.  Patient tolerated well with no complications. 

## 2019-09-29 NOTE — Progress Notes (Signed)
Pt's daughter updated via telephone call at this time.

## 2019-09-29 NOTE — Progress Notes (Signed)
Pt's head turned and arms rotated with no complications. ETT secured °

## 2019-09-29 NOTE — Progress Notes (Signed)
NAME:  Roy Lee, MRN:  [id number], DOB:  October 15, 1955, LOS: 7 ADMISSION DATE:  10/07/2019, CONSULTATION DATE:  2/16 REFERRING MD:  Aileen Fass, CHIEF COMPLAINT:  Dyspnea   Brief History   64y/o male treated for COVID 19 pneumonia and PE in January, treated with actemra, steroids, antivirals and dc'd home on 3-5 L O2 and anticoagulation.  Returned to the ED 2/12 with worsening dyspnea and hypoxemia.  Moved to the ICU on 2/16 in the setting of worsening hypoxemia and atrial fibrillation with RVR.  History of present illness   64y/o male treated for COVID 19 on January 1/24, treated with actemra, steroids, antivirals and dc'd home 1/30 on 3-5 L O2 and anticoagualtion.  Returned to the ED 2/12 with worsening dyspnea and hypoxemia.  He as been treated with ceftriaxon-azithromycin. Moved to the ICU on 2/16 in the setting of worsening hypoxemia and atrial fibrillation with RVR.  He was unable to provide more history on my exam because of severe respiratory distress.  Past Medical History  Obesity Hypertension arthitis  Significant Hospital Events   1/24 original admission, treated with actemra, steroids, antivirals 1/30 d/c home on 3-5 L O2 2/12 return to ED, admission, treated with ceftriaxone/azithro 2/16 move to ICU, intubation, bronchoscopy prone position initiated, placed on neuromuscular blockade 2/17 sedated, paralyzed. Requiring total pressures 45-46 to achieve 80m/kg. CVP up. aPCV decreased to total pressure of 34. Getting echo.  Consults:  PCCM  Procedures:  2/16 ETT >  2/16 L IJ CVL >   Significant Diagnostic Tests:  2/12 CT angiogram chest > no PE, bilateral infiltrates, nodular, progressed compared to 2/6 film 2/16 CT chest> significant progression of bilateral ground glass and consolidation compared to 2/12 film 2/17 echo: limited study, LVEF 55-60%, RV overload with mod elevated PAP  Micro Data:  2/12 blood > negative 2/14 resp culture > GPC in pair, cluster, GPR,  GNR 2/16 BAL >  2/16 BAL fungal >  2/16 BAL AFB >   Antimicrobials:  2/12  Ceftriaxone > 2/16 2/12  Azithro > 2/16  Interim history/subjective:  2/19: afib with rvr overnight on dilt again. tmax 102.6. remains on proning protocol. Labs still pending this am. Some oral bleeding noted when turned  2/18: tmax 103.5 over past 24h. Elevated pct will pan cx and starrt empiric abx.  2/17:Sedated and paralyzed   Objective   Blood pressure 111/81, pulse 95, temperature (!) 101.8 F (38.8 C), resp. rate (!) 35, height _0  (1.803 m), weight 121.7 kg, SpO2 95 %. CVP:  [7 mmHg-42 mmHg] 12 mmHg  Vent Mode: PCV FiO2 (%):  [60 %-100 %] 60 % Set Rate:  [35 bmp] 35 bmp PEEP:  [12 cmH20] 12 cmH20 Plateau Pressure:  [36 cmH20-38 cmH20] 36 cmH20   Intake/Output Summary (Last 24 hours) at 09/29/2019 0850 Last data filed at 09/29/2019 0700 Gross per 24 hour  Intake 5961.39 ml  Output 1055 ml  Net 4906.39 ml   Filed Weights   09/18/2019 1155 09/28/19 0500  Weight: 117.9 kg 121.7 kg    Examination: General this is a 64year old male sedated on vent  Currently in the Prone position  HENT NCAT no JVD MMM orally intubated Pulm decreased t/o equal bilateral chest rise  Vent PCV 22/peep 12 fio2 50%  Card currently sinus tach on tele abd soft not tender prone position Neuro sedated and paralyzed  gu clear yellow Ext brisk CR warm and dry   Resolved Hospital Problem list  Assessment & Plan:   Evolving ARDS nearly 20 days after original admission> based on progression in CT scan, this is presumably ongoing effect from COVID 19.  ddx includes acute pulmonary edema or secondary infection (influenza, bacterial pneumonia) FiO2 requirements coming down, remains on 14 of PEEP Plan Cont low tidal Vol/lung protective ventilation (currently on PCV); decreased total driving pressure to 34 from 45. -->currently ~ 41m/kg PBW VT Would accept some degree of permissive hypercarbia in trade for better lung  mechanics.  Goal pao2 55-65  Goal sats > 88%  If p/f remains < 150 will cont proning protocol every 8 hrs for 16 hrs.  -Echo c/w rh overload, LV appears good (technically limited) -hold lasix right now -remains on esclated sedation and paralytic -repeat cx pending  -Completed 5 days of  CTX and azith on 2/16, restarted broad abx.  -tmax overnight 102.6, wbc remains wnl. downtrending pct. 3.8->2.2  -add stress steroids -cxr with progressive b/l infiltrates, personally reviewed by me - he would not be an good candidate for ECMO with age, duration of disease process and arf Sepsis with shock:  2/2 above -pancx at this time -resume abx but broaden to hcap -pct elevated from 2 days ago, it is decreased today -increase vasopressin to 0.04 to neo (at 1959m) for map >65 -adding stress steroids  Atrial fibrillation with RVR  Plan -dilt was added overnight but escalating pressos -will change to amio gtt if needed Cont tele  Ensure K > 4 and Mg > 2 Arf: -am labs still pending  -cr rising on last check -follow indices.  -uop marginal considering lasix dosing, but still ~1.5L -hold further lasix at this time but consider if able to wean pressors.   H/o PE Plan Was on eliquis but with oral bleeding (now improved) will change to heparin infusion incase reoccurs.  -elevated ddimer but 2ctpa negative during this admission  Prediabetes with Hyperglycemia Plan ssi Adjust basal coverage a1c 6.3  Thrombocytopenia:  -improving -remains on full dose a/c -follow, no overt bleeding at this time. Oral bleeding as resolved  Best practice:  Diet: tube feeding Pain/Anxiety/Delirium protocol (if indicated): yes, RASS target -5, as above VAP protocol (if indicated): yes DVT prophylaxis: eliquis GI prophylaxis: Pantoprazole for stress ulcer prophylaxis Glucose control: SSI Mobility: bed rest Code Status: full Family Communication:via phone daughters 2/19 Disposition: remain in  ICU  Critical care time: The patient is critically ill with multiple organ systems failure and requires high complexity decision making for assessment and support, frequent evaluation and titration of therapies, application of advanced monitoring technologies and extensive interpretation of multiple databases.  Critical care time 47 mins. This represents my time independent of the NPs time taking care of the pt. This is excluding procedures.    [facility] 09/29/2019, 8:50 AM

## 2019-09-29 NOTE — Progress Notes (Signed)
Patient's head repositioned and arms rotated.  Patient tolerated well.  No skin breakdown noted at this time.    

## 2019-09-29 NOTE — Progress Notes (Signed)
Pt's head turned to the right without event. ETT secured throughout

## 2019-09-30 ENCOUNTER — Inpatient Hospital Stay (HOSPITAL_COMMUNITY): Payer: 59

## 2019-09-30 DIAGNOSIS — I10 Essential (primary) hypertension: Secondary | ICD-10-CM

## 2019-09-30 DIAGNOSIS — J9601 Acute respiratory failure with hypoxia: Secondary | ICD-10-CM

## 2019-09-30 LAB — GLUCOSE, CAPILLARY
Glucose-Capillary: 232 mg/dL — ABNORMAL HIGH (ref 70–99)
Glucose-Capillary: 233 mg/dL — ABNORMAL HIGH (ref 70–99)
Glucose-Capillary: 245 mg/dL — ABNORMAL HIGH (ref 70–99)
Glucose-Capillary: 245 mg/dL — ABNORMAL HIGH (ref 70–99)
Glucose-Capillary: 252 mg/dL — ABNORMAL HIGH (ref 70–99)
Glucose-Capillary: 263 mg/dL — ABNORMAL HIGH (ref 70–99)
Glucose-Capillary: 266 mg/dL — ABNORMAL HIGH (ref 70–99)

## 2019-09-30 LAB — CBC
HCT: 42.2 % (ref 39.0–52.0)
Hemoglobin: 13.1 g/dL (ref 13.0–17.0)
MCH: 29.8 pg (ref 26.0–34.0)
MCHC: 31 g/dL (ref 30.0–36.0)
MCV: 95.9 fL (ref 80.0–100.0)
Platelets: 199 10*3/uL (ref 150–400)
RBC: 4.4 MIL/uL (ref 4.22–5.81)
RDW: 15.3 % (ref 11.5–15.5)
WBC: 13.1 10*3/uL — ABNORMAL HIGH (ref 4.0–10.5)
nRBC: 0.2 % (ref 0.0–0.2)

## 2019-09-30 LAB — CULTURE, RESPIRATORY W GRAM STAIN: Culture: NO GROWTH

## 2019-09-30 LAB — VANCOMYCIN, PEAK: Vancomycin Pk: 20 ug/mL — ABNORMAL LOW (ref 30–40)

## 2019-09-30 LAB — APTT
aPTT: 56 seconds — ABNORMAL HIGH (ref 24–36)
aPTT: 70 seconds — ABNORMAL HIGH (ref 24–36)
aPTT: 67 seconds — ABNORMAL HIGH (ref 24–36)

## 2019-09-30 LAB — COMPREHENSIVE METABOLIC PANEL
ALT: 313 U/L — ABNORMAL HIGH (ref 0–44)
AST: 194 U/L — ABNORMAL HIGH (ref 15–41)
Albumin: 2.3 g/dL — ABNORMAL LOW (ref 3.5–5.0)
Alkaline Phosphatase: 115 U/L (ref 38–126)
Anion gap: 8 (ref 5–15)
BUN: 56 mg/dL — ABNORMAL HIGH (ref 8–23)
CO2: 36 mmol/L — ABNORMAL HIGH (ref 22–32)
Calcium: 7.8 mg/dL — ABNORMAL LOW (ref 8.9–10.3)
Chloride: 93 mmol/L — ABNORMAL LOW (ref 98–111)
Creatinine, Ser: 0.94 mg/dL (ref 0.61–1.24)
GFR calc Af Amer: >60 mL/min (ref >60)
GFR calc non Af Amer: >60 mL/min (ref >60)
Glucose, Bld: 233 mg/dL — ABNORMAL HIGH (ref 70–99)
Potassium: 5.5 mmol/L — ABNORMAL HIGH (ref 3.5–5.1)
Sodium: 137 mmol/L (ref 135–145)
Total Bilirubin: 0.8 mg/dL (ref 0.3–1.2)
Total Protein: 5.8 g/dL — ABNORMAL LOW (ref 6.5–8.1)

## 2019-09-30 LAB — HEPATIC FUNCTION PANEL
ALT: 245 U/L — ABNORMAL HIGH (ref 0–44)
AST: 103 U/L — ABNORMAL HIGH (ref 15–41)
Albumin: 2.2 g/dL — ABNORMAL LOW (ref 3.5–5.0)
Alkaline Phosphatase: 103 U/L (ref 38–126)
Bilirubin, Direct: 0.1 mg/dL (ref 0.0–0.2)
Indirect Bilirubin: 0.5 mg/dL (ref 0.3–0.9)
Total Bilirubin: 0.6 mg/dL (ref 0.3–1.2)
Total Protein: 5.1 g/dL — ABNORMAL LOW (ref 6.5–8.1)

## 2019-09-30 LAB — POCT I-STAT 7, (LYTES, BLD GAS, ICA,H+H)
Acid-Base Excess: 12 mmol/L — ABNORMAL HIGH (ref 0.0–2.0)
Bicarbonate: 37.9 mmol/L — ABNORMAL HIGH (ref 20.0–28.0)
Calcium, Ion: 1.14 mmol/L — ABNORMAL LOW (ref 1.15–1.40)
HCT: 32 % — ABNORMAL LOW (ref 39.0–52.0)
Hemoglobin: 10.9 g/dL — ABNORMAL LOW (ref 13.0–17.0)
O2 Saturation: 92 %
Patient temperature: 36.6
Potassium: 4.9 mmol/L (ref 3.5–5.1)
Sodium: 136 mmol/L (ref 135–145)
TCO2: 40 mmol/L — ABNORMAL HIGH (ref 22–32)
pCO2 arterial: 55.4 mmHg — ABNORMAL HIGH (ref 32.0–48.0)
pH, Arterial: 7.441 (ref 7.350–7.450)
pO2, Arterial: 63 mmHg — ABNORMAL LOW (ref 83.0–108.0)

## 2019-09-30 LAB — PROCALCITONIN: Procalcitonin: 1.34 ng/mL

## 2019-09-30 LAB — VANCOMYCIN, TROUGH: Vancomycin Tr: 9 ug/mL — ABNORMAL LOW (ref 15–20)

## 2019-09-30 MED ORDER — NOREPINEPHRINE 4 MG/250ML-% IV SOLN
0.0000 ug/min | INTRAVENOUS | Status: DC
  Administered 2019-10-01: 2 ug/min via INTRAVENOUS
  Administered 2019-10-02: 1 ug/min via INTRAVENOUS
  Administered 2019-10-04: 2 ug/min via INTRAVENOUS
  Administered 2019-10-05 – 2019-10-07 (×2): 8 ug/min via INTRAVENOUS
  Administered 2019-10-07: 6 ug/min via INTRAVENOUS
  Administered 2019-10-08: 5 ug/min via INTRAVENOUS
  Administered 2019-10-08: 7 ug/min via INTRAVENOUS
  Administered 2019-10-09: 4 ug/min via INTRAVENOUS
  Administered 2019-10-09 – 2019-10-10 (×2): 7 ug/min via INTRAVENOUS
  Filled 2019-09-30 (×7): qty 250
  Filled 2019-09-30: qty 500
  Filled 2019-09-30 (×4): qty 250

## 2019-09-30 MED ORDER — INSULIN ASPART 100 UNIT/ML ~~LOC~~ SOLN
0.0000 [IU] | SUBCUTANEOUS | Status: DC
  Administered 2019-09-30 (×3): 11 [IU] via SUBCUTANEOUS
  Administered 2019-09-30 – 2019-10-01 (×2): 7 [IU] via SUBCUTANEOUS
  Administered 2019-10-01: 20:00:00 3 [IU] via SUBCUTANEOUS
  Administered 2019-10-01: 15:00:00 7 [IU] via SUBCUTANEOUS
  Administered 2019-10-01: 4 [IU] via SUBCUTANEOUS
  Administered 2019-10-01 (×2): 11 [IU] via SUBCUTANEOUS
  Administered 2019-10-02 (×4): 4 [IU] via SUBCUTANEOUS
  Administered 2019-10-02 – 2019-10-03 (×2): 3 [IU] via SUBCUTANEOUS
  Administered 2019-10-03: 04:00:00 4 [IU] via SUBCUTANEOUS
  Administered 2019-10-03: 3 [IU] via SUBCUTANEOUS
  Administered 2019-10-03 (×2): 4 [IU] via SUBCUTANEOUS
  Administered 2019-10-04 (×2): 3 [IU] via SUBCUTANEOUS
  Administered 2019-10-05: 4 [IU] via SUBCUTANEOUS
  Administered 2019-10-05 (×3): 3 [IU] via SUBCUTANEOUS
  Administered 2019-10-05 – 2019-10-06 (×3): 4 [IU] via SUBCUTANEOUS
  Administered 2019-10-06: 3 [IU] via SUBCUTANEOUS
  Administered 2019-10-06 (×3): 4 [IU] via SUBCUTANEOUS
  Administered 2019-10-07: 21:00:00 3 [IU] via SUBCUTANEOUS
  Administered 2019-10-07: 4 [IU] via SUBCUTANEOUS
  Administered 2019-10-07: 17:00:00 3 [IU] via SUBCUTANEOUS
  Administered 2019-10-07 – 2019-10-08 (×3): 4 [IU] via SUBCUTANEOUS
  Administered 2019-10-08 (×3): 3 [IU] via SUBCUTANEOUS
  Administered 2019-10-08: 4 [IU] via SUBCUTANEOUS
  Administered 2019-10-09 (×2): 3 [IU] via SUBCUTANEOUS
  Administered 2019-10-09: 4 [IU] via SUBCUTANEOUS
  Administered 2019-10-09: 3 [IU] via SUBCUTANEOUS
  Administered 2019-10-09 – 2019-10-11 (×7): 4 [IU] via SUBCUTANEOUS
  Administered 2019-10-11: 3 [IU] via SUBCUTANEOUS
  Administered 2019-10-11: 4 [IU] via SUBCUTANEOUS
  Administered 2019-10-11 – 2019-10-12 (×7): 3 [IU] via SUBCUTANEOUS
  Administered 2019-10-13: 7 [IU] via SUBCUTANEOUS
  Administered 2019-10-13: 11 [IU] via SUBCUTANEOUS
  Administered 2019-10-13: 7 [IU] via SUBCUTANEOUS
  Administered 2019-10-13: 4 [IU] via SUBCUTANEOUS

## 2019-09-30 MED ORDER — SODIUM ZIRCONIUM CYCLOSILICATE 10 G PO PACK
10.0000 g | PACK | Freq: Two times a day (BID) | ORAL | Status: AC
  Administered 2019-09-30 – 2019-10-01 (×2): 10 g via ORAL
  Filled 2019-09-30 (×2): qty 1

## 2019-09-30 MED ORDER — INSULIN ASPART 100 UNIT/ML ~~LOC~~ SOLN
0.0000 [IU] | SUBCUTANEOUS | Status: DC
  Administered 2019-09-30: 08:00:00 5 [IU] via SUBCUTANEOUS
  Administered 2019-09-30: 05:00:00 3 [IU] via SUBCUTANEOUS

## 2019-09-30 MED ORDER — VANCOMYCIN HCL IN DEXTROSE 1-5 GM/200ML-% IV SOLN
1000.0000 mg | Freq: Two times a day (BID) | INTRAVENOUS | Status: DC
  Administered 2019-10-01 – 2019-10-03 (×5): 1000 mg via INTRAVENOUS
  Filled 2019-09-30 (×6): qty 200

## 2019-09-30 NOTE — Progress Notes (Signed)
ANTICOAGULATION CONSULT NOTE - Follow Up Consult  Pharmacy Consult for heparin (apixaban PTA)  Indication: H/o PE   No Known Allergies  Patient Measurements: Height: 5\' 11"  (180.3 cm) Weight: 281 lb 4.9 oz (127.6 kg) IBW/kg (Calculated) : 75.3 Heparin Dosing Weight: 101 kg   Vital Signs: Temp: 98.4 F (36.9 C) (02/20 1219) Temp Source: Esophageal (02/20 0745) BP: 111/82 (02/20 1219) Pulse Rate: 80 (02/20 1219)  Labs: Recent Labs    09/28/19 0445 09/28/19 1253 09/29/19 0801 09/29/19 0801 09/29/19 1535 09/30/19 0400 09/30/19 1330  HGB 12.4*   < > 12.5*   < > 12.6* 13.1  --   HCT 40.6   < > 40.5  --  37.0* 42.2  --   PLT 123*  --  162  --   --  199  --   APTT  --   --   --   --   --  56* 70*  CREATININE 1.58*  --  1.12  --   --  0.94  --    < > = values in this interval not displayed.    Estimated Creatinine Clearance: 112.3 mL/min (by C-G formula based on SCr of 0.94 mg/dL).   Medical History: Past Medical History:  Diagnosis Date  . Arthritis   . Hypertension   . Obesity     Medications:  Medications Prior to Admission  Medication Sig Dispense Refill Last Dose  . apixaban (ELIQUIS) 5 MG TABS tablet Please take 10 mg oral twice daily for next 5 days, then transition to 5 mg oral twice daily on 09/14/2019 (Patient taking differently: Take 5 mg by mouth 2 (two) times daily. ) 70 tablet 0 09/21/2019 at 830am  . dextromethorphan-guaiFENesin (MUCINEX DM) 30-600 MG 12hr tablet Take 1 tablet by mouth 2 (two) times daily as needed for cough.   10/06/2019 at Unknown time  . furosemide (LASIX) 20 MG tablet Take 1 tablet (20 mg total) by mouth daily. (Patient taking differently: Take 40 mg by mouth daily. ) 30 tablet 1 09/11/2019 at Unknown time  . ibuprofen (ADVIL) 200 MG tablet Take 600-800 mg by mouth every 6 (six) hours as needed for fever, headache or moderate pain.   Past Month at Unknown time  . latanoprost (XALATAN) 0.005 % ophthalmic solution Place 1 drop into both eyes  daily.   09/26/2019 at Unknown time  . lisinopril (ZESTRIL) 40 MG tablet Take 40 mg by mouth daily.   09/15/2019 at Unknown time  . Multiple Vitamin (MULTIVITAMIN WITH MINERALS) TABS tablet Take 1 tablet by mouth daily.   10/05/2019 at Unknown time  . vitamin C (ASCORBIC ACID) 500 MG tablet Take 500 mg by mouth daily.   09/13/2019 at Unknown time  . acetaminophen (TYLENOL) 325 MG tablet Take 2 tablets (650 mg total) by mouth every 6 (six) hours as needed for mild pain or headache (fever >/= 101). (Patient not taking: Reported on 10/06/2019)   Not Taking at Unknown time  . dexamethasone (DECADRON) 4 MG tablet Take 2 tablets (8 mg total) by mouth daily. (Patient not taking: Reported on 10/04/2019) 10 tablet 0 Not Taking at Unknown time  . pantoprazole (PROTONIX) 40 MG tablet Take 1 tablet (40 mg total) by mouth daily. (Patient not taking: Reported on 09/16/2019) 30 tablet 0 Not Taking at Unknown time    Assessment: 68 YOM with COVID continued on home apixaban for a h/o PE. Pharmacy was consulted to transition to IV heparin.   Currently on IV heparin  at 1650 units/hr. Heparin level is now low therapeutic at 70 seconds.    Goal of Therapy:  Heparin level 0.3-0.7 units/ml aPTT 66-102 seconds Monitor platelets by anticoagulation protocol: Yes   Plan:  -Increase IV heparin slightly to 1700 units/hr. No bolus  -F/u confirmatory 6 hr aPTT -Monitor daily aPTT, HL, CBC and s/s of bleeding   Vinnie Level, PharmD., BCPS Clinical Pharmacist Clinical phone for 09/30/19 until 5pm: [phone]

## 2019-09-30 NOTE — Progress Notes (Signed)
Pt's head turned and arms rotated with no complications.  ETT secured. VS stable throughout

## 2019-09-30 NOTE — Progress Notes (Signed)
ANTICOAGULATION CONSULT NOTE - Follow Up Consult  Pharmacy Consult for heparin (apixaban PTA)  Indication: H/o PE   No Known Allergies  Patient Measurements: Height: 5\' 11"  (180.3 cm) Weight: 281 lb 4.9 oz (127.6 kg) IBW/kg (Calculated) : 75.3 Heparin Dosing Weight: 101 kg   Vital Signs: Temp: 98.1 F (36.7 C) (02/20 0700) BP: 122/86 (02/20 0700) Pulse Rate: 82 (02/20 0700)  Labs: Recent Labs    09/28/19 0445 09/28/19 1253 09/29/19 0801 09/29/19 0801 09/29/19 1535 09/30/19 0400  HGB 12.4*   < > 12.5*   < > 12.6* 13.1  HCT 40.6   < > 40.5  --  37.0* 42.2  PLT 123*  --  162  --   --  199  APTT  --   --   --   --   --  56*  CREATININE 1.58*  --  1.12  --   --  0.94   < > = values in this interval not displayed.    Estimated Creatinine Clearance: 112.3 mL/min (by C-G formula based on SCr of 0.94 mg/dL).   Medical History: Past Medical History:  Diagnosis Date  . Arthritis   . Hypertension   . Obesity     Medications:  Medications Prior to Admission  Medication Sig Dispense Refill Last Dose  . apixaban (ELIQUIS) 5 MG TABS tablet Please take 10 mg oral twice daily for next 5 days, then transition to 5 mg oral twice daily on 09/14/2019 (Patient taking differently: Take 5 mg by mouth 2 (two) times daily. ) 70 tablet 0 10-02-19 at 830am  . dextromethorphan-guaiFENesin (MUCINEX DM) 30-600 MG 12hr tablet Take 1 tablet by mouth 2 (two) times daily as needed for cough.   10/02/19 at Unknown time  . furosemide (LASIX) 20 MG tablet Take 1 tablet (20 mg total) by mouth daily. (Patient taking differently: Take 40 mg by mouth daily. ) 30 tablet 1 October 02, 2019 at Unknown time  . ibuprofen (ADVIL) 200 MG tablet Take 600-800 mg by mouth every 6 (six) hours as needed for fever, headache or moderate pain.   Past Month at Unknown time  . latanoprost (XALATAN) 0.005 % ophthalmic solution Place 1 drop into both eyes daily.   10/02/2019 at Unknown time  . lisinopril (ZESTRIL) 40 MG tablet  Take 40 mg by mouth daily.   10-02-2019 at Unknown time  . Multiple Vitamin (MULTIVITAMIN WITH MINERALS) TABS tablet Take 1 tablet by mouth daily.   10-02-19 at Unknown time  . vitamin C (ASCORBIC ACID) 500 MG tablet Take 500 mg by mouth daily.   10-02-2019 at Unknown time  . acetaminophen (TYLENOL) 325 MG tablet Take 2 tablets (650 mg total) by mouth every 6 (six) hours as needed for mild pain or headache (fever >/= 101). (Patient not taking: Reported on 10/02/2019)   Not Taking at Unknown time  . dexamethasone (DECADRON) 4 MG tablet Take 2 tablets (8 mg total) by mouth daily. (Patient not taking: Reported on 2019-10-02) 10 tablet 0 Not Taking at Unknown time  . pantoprazole (PROTONIX) 40 MG tablet Take 1 tablet (40 mg total) by mouth daily. (Patient not taking: Reported on October 02, 2019) 30 tablet 0 Not Taking at Unknown time    Assessment: 36 YOM with COVID continued on home apixaban for a h/o PE. Pharmacy was consulted to transition to IV heparin. Initial aPTT this AM on 1500 units/hr of IV Heparin is subtherapeutic at 56.   CBC normalized this AM.  No overt s/s of bleeding  noted   Goal of Therapy:  Heparin level 0.3-0.7 units/ml aPTT 66-102 seconds Monitor platelets by anticoagulation protocol: Yes   Plan:  -Increase IV heparin to 1650 units/hr. No bolus  -F/u 6 hr aPTT -Monitor daily aPTT, HL, CBC and s/s of bleeding   Vinnie Level, PharmD., BCPS Clinical Pharmacist Clinical phone for 09/30/19 until 5pm: [phone]

## 2019-09-30 NOTE — Progress Notes (Signed)
Pt placed in prone position with no complications by 2 RTs and 3 RNs.  Foam pads added to cheek, upper lip.  ETT secured at 25cm at the lips with cloth tape.

## 2019-09-30 NOTE — Progress Notes (Signed)
NAME:  Roy Lee, MRN:  [id number], DOB:  [birth year], LOS: 8 ADMISSION DATE:  09/18/2019, CONSULTATION DATE:  2/16 REFERRING MD:  Aileen Fass, CHIEF COMPLAINT:  Dyspnea   Brief History   64y/o male treated for COVID 19 pneumonia and PE in January, treated with actemra, steroids, antivirals and dc'd home on 3-5 L O2 and anticoagulation.  Returned to the ED 2/12 with worsening dyspnea and hypoxemia.  Moved to the ICU on 2/16 in the setting of worsening hypoxemia and atrial fibrillation with RVR.  Past Medical History  Obesity Hypertension arthitis  Significant Hospital Events   1/24 original admission, treated with actemra, steroids, antivirals 1/30 d/c home on 3-5 L O2 2/12 return to ED, admission, treated with ceftriaxone/azithro 2/16 move to ICU, intubation, bronchoscopy, prone, afib with rvr started dilt 2/18 fever, started antibiotics   Consults:  PCCM  Procedures:  2/16 ETT >  2/16 L IJ CVL >   Significant Diagnostic Tests:  2/12 CT angiogram chest > no PE, bilateral infiltrates, nodular, progressed compared to 2/6 film 2/16 CT chest> significant progression of bilateral ground glass and consolidation compared to 2/12 film 2/17 Echo> RV dilation and RV pressure overload, LVEF normal   Micro Data:  2/12 blood > negative 2/14 resp culture > GPC in pair, cluster, GPR, GNR 2/16 BAL > ngtd 2/16 BAL fungal > ngtd 2/16 BAL AFB > ngtd  2/18 blood >  2/18 resp culture >   Antimicrobials:  2/12  Ceftriaxone > 2/16  2/12  Azithro > 2/16   2/18 cefepime >  2/18 vanc >   Interim history/subjective:   K 5.6 Urine out put is adequate Remains on pressure control 28, rate 25, plateau 32 ABG OK Supine at 12:30 today Fever on 2/19, started broad spectrum antibioics  Objective   Blood pressure 122/86, pulse 82, temperature 98.1 F (36.7 C), resp. rate (!) 35, height _0  (1.803 m), weight 127.6 kg, SpO2 95 %. CVP:  [5 mmHg-71 mmHg] 71 mmHg  Vent Mode: PCV FiO2  (%):  [60 %-70 %] 70 % Set Rate:  [35 bmp] 35 bmp PEEP:  [12 cmH20] 12 cmH20 Plateau Pressure:  [34 cmH20-38 cmH20] 38 cmH20   Intake/Output Summary (Last 24 hours) at 09/30/2019 0569 Last data filed at 09/30/2019 0700 Gross per 24 hour  Intake 5733.29 ml  Output 1935 ml  Net 3798.29 ml   Filed Weights   09/25/2019 1155 09/28/19 0500 09/30/19 0436  Weight: 117.9 kg 121.7 kg 127.6 kg    Examination:  General:  In bed on vent HENT: NCAT ETT in place PULM: CTA B, vent supported breathing CV: RRR, no mgr GI: BS+, soft, nontender MSK: normal bulk and tone Neuro: sedated on vent  2/20 personally reviewed, severe bilateral airspace [facility] Problem list     Assessment & Plan:  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, late phase disease Plateau 34 on 2/20, acceptable Continue mechanical ventilation per ARDS protocol Target TVol 6-8cc/kgIBW Target Plateau Pressure < 30cm H20 Target driving pressure less than 15 cm of water Target PaO2 55-65: titrate PEEP/FiO2 per protocol As long as PaO2 to FiO2 ratio is less than 1:150 position in prone position for 16 hours a day Check CVP daily if CVL in place Target CVP less than 4, diurese as necessary Ventilator associated pneumonia prevention protocol 2/20 continue pressure control ventialtion as this helps [place] minimize absolute and driving pressures, check supine abg, attempt to stop neuromuscular blockade, continue full mechanical  vent support  Atrial fibrillation with RVR > well controlled, cadizem off 2/20 Tele Monitor off diltiazem  PE Heparin infusion Monitor for bleeding Transfuse PRBC for Hgb < 7 gm/dL  Epistaxis 2/19 > none since  Continue heparin for now Monitor for bleeding Likely back to Eliquis on 2/21  Hyperkalemia, urine out put and renal function OK lokelma Monitor BMET and UOP Replace electrolytes as needed  Circulatory shock/Septic shock:  On neosynephrine and vasopressin Wean off  neosynephrine for MAP> 65 Stress dose steroids  LFT abnormalities today> shock liver Repeat in AM  Hyperglycemia with prediabetes increase to resistant scale Continue basal insulin longacting coverage   Need for sedation for mechanical ventilation, severe ventilator dyssynchrony Neuromuscular blockade protocol > attempt to hold today Fentanyl/versed RASS target -5  Prognosis remains guarded.  Agree he is not a good candidate for ECMO   Best practice:  Diet: tube feeding Pain/Anxiety/Delirium protocol (if indicated): yes, RASS target -5, as above VAP protocol (if indicated): yes DVT prophylaxis: eliquis GI prophylaxis: Pantoprazole for stress ulcer prophylaxis Glucose control: SSI Mobility: bed rest Code Status: full Family Communication: I updated his daughters by phone on 2/20 Disposition: remain in ICU  Labs   CBC: Recent Labs  Lab 09/24/19 0140 09/24/19 0140 09/25/19 0341 09/25/19 0341 09/26/19 0318 09/26/19 1136 09/27/19 0500 09/27/19 1110 09/28/19 0445 09/28/19 1253 09/29/19 0801 09/29/19 1535 09/30/19 0400  WBC 12.8*   < > 9.9   < > 9.0  --  7.5  --  7.2  --  10.5  --  13.1*  NEUTROABS 11.4*  --  9.0*  --  7.0  --  5.9  --   --   --   --   --   --   HGB 13.4   < > 14.1   < > 15.6   < > 13.0   < > 12.4* 11.9* 12.5* 12.6* 13.1  HCT 39.2   < > 41.9   < > 46.4   < > 39.4   < > 40.6 35.0* 40.5 37.0* 42.2  MCV 88.1   < > 89.1   < > 89.6  --  91.2  --  97.4  --  99.8  --  95.9  PLT 138*   < > 139*   < > 128*  --  108*  --  123*  --  162  --  199   < > = values in this interval not displayed.    Basic Metabolic Panel: Recent Labs  Lab 09/25/19 0341 09/25/19 0341 09/26/19 0318 09/26/19 1136 09/26/19 1730 09/27/19 0103 09/27/19 0500 09/27/19 1110 09/27/19 1750 09/27/19 1940 09/28/19 0445 09/28/19 1253 09/29/19 0801 09/29/19 1535 09/30/19 0400  NA 139   < > 138   < >  --    < > 138   < >  --    < > 140 136 137 136 137  K 4.9   < > 4.0   < >  --     < > 4.2   < >  --    < > 5.1 4.6 5.6* 5.0 5.5*  CL 105   < > 100  --   --   --  94*  --   --   --  89*  --  92*  --  93*  CO2 26   < > 27  --   --   --  36*  --   --   --  42*  --  38*  --  36*  GLUCOSE 138*   < > 138*  --   --   --  216*  --   --   --  158*  --  231*  --  233*  BUN 24*   < > 22  --   --   --  31*  --   --   --  48*  --  57*  --  56*  CREATININE 0.74   < > 0.78  --   --   --  0.79  --   --   --  1.58*  --  1.12  --  0.94  CALCIUM 8.4*   < > 8.4*  --   --   --  7.5*  --   --   --  7.8*  --  7.5*  --  7.8*  MG 2.1  --  2.0  --  2.1  --  2.1  --  2.5*  --   --   --   --   --   --   PHOS 2.9  --  2.6  --  5.2*  --  3.0  --  5.0*  --   --   --   --   --   --    < > = values in this interval not displayed.   GFR: Estimated Creatinine Clearance: 112.3 mL/min (by C-G formula based on SCr of 0.94 mg/dL). Recent Labs  Lab 09/24/19 0140 09/25/19 0341 09/27/19 0500 09/28/19 0445 09/29/19 0320 09/29/19 0801 09/30/19 0400  PROCALCITON 0.15  --   --  3.80 2.21  --  1.34  WBC 12.8*   < > 7.5 7.2  --  10.5 13.1*   < > = values in this interval not displayed.    Liver Function Tests: Recent Labs  Lab 09/26/19 0318 09/27/19 0500 09/28/19 0445 09/29/19 0801 09/30/19 0400  AST _0 69* 194*  ALT 22 20 33 92* 313*  ALKPHOS 80 70 72 96 115  BILITOT 0.7 0.7 0.8 0.4 0.8  PROT 6.1* 5.1* 5.3* 4.8* 5.8*  ALBUMIN 3.0* 2.4* 2.4* 2.2* 2.3*   No results for input(s): LIPASE, AMYLASE in the last 168 hours. No results for input(s): AMMONIA in the last 168 hours.  ABG    Component Value Date/Time   PHART 7.358 09/29/2019 1535   PCO2ART 67.1 (HH) 09/29/2019 1535   PO2ART 72.0 (L) 09/29/2019 1535   HCO3 37.6 (H) 09/29/2019 1535   TCO2 40 (H) 09/29/2019 1535   ACIDBASEDEF 7.0 (H) 09/26/2019 1136   O2SAT 92.0 09/29/2019 1535     Coagulation Profile: No results for input(s): INR, PROTIME in the last 168 hours.  Cardiac Enzymes: No results for input(s): CKTOTAL, CKMB,  CKMBINDEX, TROPONINI in the last 168 hours.  HbA1C: Hgb A1c MFr Bld  Date/Time Value Ref Range Status  09/06/2019 11:40 AM 6.3 (H) 4.8 - 5.6 % Final    Comment:    (NOTE) Pre diabetes:          5.7%-6.4% Diabetes:              >6.4% Glycemic control for   <7.0% adults with diabetes     CBG: Recent Labs  Lab 09/29/19 1727 09/29/19 1937 09/29/19 2346 09/30/19 0407 09/30/19 0734  GLUCAP 207* 213* 232* 233* 263*     Critical care time: 40 minutes     Roselie Awkward, MD [place] [facility] Pager: [phone] Cell: [phone] If no  response, call [phone]

## 2019-09-30 NOTE — Progress Notes (Signed)
Pharmacy Antibiotic Note  Roy Lee is a 64 y.o. male admitted on 09/12/2019 with COVID-19 pneumonia now with concern for sepsis from bacterial infection.  Pharmacy has been consulted for Cefepime and vancomycin dosing. Of note, patient has been spiking significant fevers with increasing procalcitonin.   Pt is now on D3 of vanc/cefepime. His PCT has trended down as well as fevers. Cultures remained neg to date. His MRSA PCR came back neg. His renal function has improved as well. Vanc levels were drawn today. Consider dc vanc at this time since his PCR is neg. We will adjust dose tonight before discussing on rounds in AM.   Vanc peak = 20 Vanc trough = 9 AUC = 353 (goal 400-550) Scr down 1.34 WBC 13.1 but on steroids  Plan: Continue cefepime 2g IV q8 Change vanc to 1g IV q12>AUC 470, scr 0.94 Consider dc vanc   Height: _0  (180.3 cm) Weight: 281 lb 4.9 oz (127.6 kg) IBW/kg (Calculated) : 75.3  Temp (24hrs), Avg:98.2 F (36.8 C), Min:97.2 F (36.2 C), Max:99.7 F (37.6 C)  Recent Labs  Lab 09/26/19 0318 09/27/19 0500 09/28/19 0445 09/29/19 0801 09/30/19 0400 09/30/19 1330 09/30/19 1850  WBC 9.0 7.5 7.2 10.5 13.1*  --   --   CREATININE 0.78 0.79 1.58* 1.12 0.94  --   --   VANCOTROUGH  --   --   --   --   --  9*  --   VANCOPEAK  --   --   --   --   --   --  20*    Estimated Creatinine Clearance: 112.3 mL/min (by C-G formula based on SCr of 0.94 mg/dL).    No Known Allergies  Antimicrobials this admission: Cefepime 2/18 >>  Vanc 2/18 >>   Dose adjustments this admission: 2/18 vanc 770m IV q12>> VP 20, VT 9>AUC 353 2.20 vanc 1g IV q12  Microbiology results: 2/12 BCx: neg  2/16 BAL: negF    2/18 urine>>neg 2/16 MRSA PCR: neg  MOnnie Boer PharmD, BCIDP, AAHIVP, CPP Infectious Disease Pharmacist 09/30/2019 8:01 PM

## 2019-09-30 NOTE — Progress Notes (Addendum)
Phenylephrine paused (stopped) at this time.

## 2019-09-30 NOTE — Progress Notes (Signed)
Pt's daughter updated at this time. 

## 2019-09-30 NOTE — Plan of Care (Signed)
Discussed in front of patient with daughter over the phone plan of care for the evening, lab results and answered all questions at this time.

## 2019-09-30 NOTE — Progress Notes (Signed)
Placed pt in supine position with no complications by 1RT and 4RNs. Cloth taped changed to tube holder ETAD secured at 25cm at the lip

## 2019-09-30 NOTE — Progress Notes (Addendum)
ANTICOAGULATION CONSULT NOTE - Follow Up Consult  Pharmacy Consult for heparin (apixaban PTA)  Indication: H/o PE   No Known Allergies  Patient Measurements: Height: 5\' 11"  (180.3 cm) Weight: 281 lb 4.9 oz (127.6 kg) IBW/kg (Calculated) : 75.3 Heparin Dosing Weight: 101 kg   Vital Signs: Temp: 97.7 F (36.5 C) (02/20 2215) Temp Source: Esophageal (02/20 1945) BP: 95/75 (02/20 2215) Pulse Rate: 69 (02/20 2335)  Labs: Recent Labs    09/28/19 0445 09/28/19 1253 09/29/19 0801 09/29/19 0801 09/29/19 1535 09/29/19 1535 09/30/19 0400 09/30/19 1330 09/30/19 1621 09/30/19 2140  HGB 12.4*   < > 12.5*   < > 12.6*   < > 13.1  --  10.9*  --   HCT 40.6   < > 40.5   < > 37.0*  --  42.2  --  32.0*  --   PLT 123*  --  162  --   --   --  199  --   --   --   APTT  --   --   --   --   --   --  56* 70*  --  67*  CREATININE 1.58*  --  1.12  --   --   --  0.94  --   --   --    < > = values in this interval not displayed.    Estimated Creatinine Clearance: 112.3 mL/min (by C-G formula based on SCr of 0.94 mg/dL).   Assessment: 35 YOM with COVID continued on home apixaban for a h/o PE. Pharmacy was consulted to transition to IV heparin.   Currently on IV heparin at 1650 units/hr. PTT remains low therapeutic at 67 seconds.   Goal of Therapy:  Heparin level 0.3-0.7 units/ml aPTT 66-102 seconds Monitor platelets by anticoagulation protocol: Yes   Plan:  -Increase IV heparin slightly to 1800 units/hr. -Monitor daily aPTT, HL, CBC and s/s of bleeding   77, PharmD, BCPS Please see amion for complete clinical pharmacist phone list 09/30/2019 11:41 PM   Addendum (0600): PTT drawn ~3 hours post gtt rate change and up to 96 sec (therapeutic). Heparin level elevated 1.72 (due to apixaban).  Plan: Will f/u 6 hr PTT to confirm that level doesn't trend to supratherapeutic.  10/02/2019, PharmD, BCPS Please see amion for complete clinical pharmacist phone list 10/01/2019 6:09  AM

## 2019-09-30 NOTE — Progress Notes (Signed)
Confirmed with Tracie, RN, to use Levophed if needed, not phenylephrine.

## 2019-09-30 NOTE — Plan of Care (Signed)
  Problem: Education: Goal: Knowledge of risk factors and measures for prevention of condition will improve Outcome: Progressing   Problem: Coping: Goal: Psychosocial and spiritual needs will be supported Outcome: Progressing   Problem: Respiratory: Goal: Will maintain a patent airway Outcome: Progressing Goal: Complications related to the disease process, condition or treatment will be avoided or minimized Outcome: Progressing   Problem: Education: Goal: Knowledge of General Education information will improve Description: Including pain rating scale, medication(s)/side effects and non-pharmacologic comfort measures Outcome: Progressing   Problem: Health Behavior/Discharge Planning: Goal: Ability to manage health-related needs will improve Outcome: Progressing   Problem: Clinical Measurements: Goal: Ability to maintain clinical measurements within normal limits will improve Outcome: Progressing Goal: Will remain free from infection Outcome: Progressing Goal: Diagnostic test results will improve Outcome: Progressing Goal: Respiratory complications will improve Outcome: Progressing Goal: Cardiovascular complication will be avoided Outcome: Progressing   Problem: Activity: Goal: Risk for activity intolerance will decrease Outcome: Progressing   Problem: Nutrition: Goal: Adequate nutrition will be maintained Outcome: Progressing   Problem: Coping: Goal: Level of anxiety will decrease Outcome: Progressing   Problem: Elimination: Goal: Will not experience complications related to bowel motility Outcome: Progressing Goal: Will not experience complications related to urinary retention Outcome: Progressing   Problem: Pain Managment: Goal: General experience of comfort will improve Outcome: Progressing   Problem: Safety: Goal: Ability to remain free from injury will improve Outcome: Progressing   Problem: Skin Integrity: Goal: Risk for impaired skin integrity will  decrease Outcome: Progressing   Problem: Education: Goal: Knowledge of disease or condition will improve Outcome: Progressing Goal: Understanding of medication regimen will improve Outcome: Progressing Goal: Individualized Educational Video(s) Outcome: Progressing   Problem: Activity: Goal: Ability to tolerate increased activity will improve Outcome: Progressing   Problem: Cardiac: Goal: Ability to achieve and maintain adequate cardiopulmonary perfusion will improve Outcome: Progressing   

## 2019-09-30 NOTE — Progress Notes (Signed)
Pt's hear turned and arms rotated with no complications.  ETT secured  

## 2019-09-30 NOTE — Progress Notes (Signed)
eLink Physician-Brief Progress Note Patient Name: Roy Lee DOB: 1955-09-19 MRN: [id number]   Date of Service  09/30/2019  HPI/Events of Note  Pt is intubated and on enteral nutrition, Insulin coverage scale needs to be changed from [facility] and HS to Q 4 hours.  eICU Interventions  Order entered to change scale to Q 4 hours.        Thomasene Lot Kaliann Coryell 09/30/2019, 3:55 AM

## 2019-09-30 NOTE — Plan of Care (Addendum)
Discussed over the phone with daughter Lanora Manis after calling Luther Parody with no answer in front of the patient plan of care for the evening, paralytic and coping mechanisms with some teach back displayed.  Answered all questions at this time and updated daughter (she state he likes [employer] and alternative rock music).

## 2019-10-01 ENCOUNTER — Other Ambulatory Visit: Payer: Self-pay

## 2019-10-01 ENCOUNTER — Inpatient Hospital Stay (HOSPITAL_COMMUNITY): Payer: 59

## 2019-10-01 LAB — GLUCOSE, CAPILLARY
Glucose-Capillary: 148 mg/dL — ABNORMAL HIGH (ref 70–99)
Glucose-Capillary: 169 mg/dL — ABNORMAL HIGH (ref 70–99)
Glucose-Capillary: 214 mg/dL — ABNORMAL HIGH (ref 70–99)
Glucose-Capillary: 242 mg/dL — ABNORMAL HIGH (ref 70–99)
Glucose-Capillary: 266 mg/dL — ABNORMAL HIGH (ref 70–99)

## 2019-10-01 LAB — POCT I-STAT 7, (LYTES, BLD GAS, ICA,H+H)
Acid-Base Excess: 10 mmol/L — ABNORMAL HIGH (ref 0.0–2.0)
Bicarbonate: 37.6 mmol/L — ABNORMAL HIGH (ref 20.0–28.0)
Calcium, Ion: 1.14 mmol/L — ABNORMAL LOW (ref 1.15–1.40)
HCT: 31 % — ABNORMAL LOW (ref 39.0–52.0)
Hemoglobin: 10.5 g/dL — ABNORMAL LOW (ref 13.0–17.0)
O2 Saturation: 99 %
Patient temperature: 36.6
Potassium: 4 mmol/L (ref 3.5–5.1)
Sodium: 136 mmol/L (ref 135–145)
TCO2: 40 mmol/L — ABNORMAL HIGH (ref 22–32)
pCO2 arterial: 63.4 mmHg — ABNORMAL HIGH (ref 32.0–48.0)
pH, Arterial: 7.379 (ref 7.350–7.450)
pO2, Arterial: 149 mmHg — ABNORMAL HIGH (ref 83.0–108.0)

## 2019-10-01 LAB — CBC WITH DIFFERENTIAL/PLATELET
Abs Immature Granulocytes: 3.32 10*3/uL — ABNORMAL HIGH (ref 0.00–0.07)
Basophils Absolute: 0.1 10*3/uL (ref 0.0–0.1)
Basophils Relative: 0 %
Eosinophils Absolute: 0.1 10*3/uL (ref 0.0–0.5)
Eosinophils Relative: 0 %
HCT: 43.2 % (ref 39.0–52.0)
Hemoglobin: 13 g/dL (ref 13.0–17.0)
Immature Granulocytes: 12 %
Lymphocytes Relative: 13 %
Lymphs Abs: 3.5 10*3/uL (ref 0.7–4.0)
MCH: 30 pg (ref 26.0–34.0)
MCHC: 30.1 g/dL (ref 30.0–36.0)
MCV: 99.5 fL (ref 80.0–100.0)
Monocytes Absolute: 2.1 10*3/uL — ABNORMAL HIGH (ref 0.1–1.0)
Monocytes Relative: 8 %
Neutro Abs: 18 10*3/uL — ABNORMAL HIGH (ref 1.7–7.7)
Neutrophils Relative %: 67 %
Platelets: 240 10*3/uL (ref 150–400)
RBC: 4.34 MIL/uL (ref 4.22–5.81)
RDW: 15.4 % (ref 11.5–15.5)
WBC: 27 10*3/uL — ABNORMAL HIGH (ref 4.0–10.5)
nRBC: 1.1 % — ABNORMAL HIGH (ref 0.0–0.2)

## 2019-10-01 LAB — CBC
HCT: 34.3 % — ABNORMAL LOW (ref 39.0–52.0)
Hemoglobin: 10.7 g/dL — ABNORMAL LOW (ref 13.0–17.0)
MCH: 29.9 pg (ref 26.0–34.0)
MCHC: 31.2 g/dL (ref 30.0–36.0)
MCV: 95.8 fL (ref 80.0–100.0)
Platelets: 162 10*3/uL (ref 150–400)
RBC: 3.58 MIL/uL — ABNORMAL LOW (ref 4.22–5.81)
RDW: 15.4 % (ref 11.5–15.5)
WBC: 14.1 10*3/uL — ABNORMAL HIGH (ref 4.0–10.5)
nRBC: 0.8 % — ABNORMAL HIGH (ref 0.0–0.2)

## 2019-10-01 LAB — COMPREHENSIVE METABOLIC PANEL
ALT: 277 U/L — ABNORMAL HIGH (ref 0–44)
AST: 102 U/L — ABNORMAL HIGH (ref 15–41)
Albumin: 2.8 g/dL — ABNORMAL LOW (ref 3.5–5.0)
Alkaline Phosphatase: 126 U/L (ref 38–126)
Anion gap: 9 (ref 5–15)
BUN: 72 mg/dL — ABNORMAL HIGH (ref 8–23)
CO2: 39 mmol/L — ABNORMAL HIGH (ref 22–32)
Calcium: 8.1 mg/dL — ABNORMAL LOW (ref 8.9–10.3)
Chloride: 95 mmol/L — ABNORMAL LOW (ref 98–111)
Creatinine, Ser: 1.28 mg/dL — ABNORMAL HIGH (ref 0.61–1.24)
GFR calc Af Amer: >60 mL/min (ref >60)
GFR calc non Af Amer: >60 mL/min (ref >60)
Glucose, Bld: 315 mg/dL — ABNORMAL HIGH (ref 70–99)
Potassium: 5.5 mmol/L — ABNORMAL HIGH (ref 3.5–5.1)
Sodium: 143 mmol/L (ref 135–145)
Total Bilirubin: 0.8 mg/dL (ref 0.3–1.2)
Total Protein: 6.3 g/dL — ABNORMAL LOW (ref 6.5–8.1)

## 2019-10-01 LAB — BASIC METABOLIC PANEL
Anion gap: 8 (ref 5–15)
BUN: 77 mg/dL — ABNORMAL HIGH (ref 8–23)
CO2: 35 mmol/L — ABNORMAL HIGH (ref 22–32)
Calcium: 7.7 mg/dL — ABNORMAL LOW (ref 8.9–10.3)
Chloride: 96 mmol/L — ABNORMAL LOW (ref 98–111)
Creatinine, Ser: 1.37 mg/dL — ABNORMAL HIGH (ref 0.61–1.24)
GFR calc Af Amer: >60 mL/min (ref >60)
GFR calc non Af Amer: 55 mL/min — ABNORMAL LOW (ref >60)
Glucose, Bld: 214 mg/dL — ABNORMAL HIGH (ref 70–99)
Potassium: 4.6 mmol/L (ref 3.5–5.1)
Sodium: 139 mmol/L (ref 135–145)

## 2019-10-01 LAB — APTT: aPTT: 96 seconds — ABNORMAL HIGH (ref 24–36)

## 2019-10-01 LAB — HEPARIN LEVEL (UNFRACTIONATED): Heparin Unfractionated: 1.72 IU/mL — ABNORMAL HIGH (ref 0.30–0.70)

## 2019-10-01 MED ORDER — AMIODARONE HCL IN DEXTROSE 360-4.14 MG/200ML-% IV SOLN
30.0000 mg/h | INTRAVENOUS | Status: AC
  Administered 2019-10-01 – 2019-10-04 (×6): 30 mg/h via INTRAVENOUS
  Filled 2019-10-01 (×5): qty 200

## 2019-10-01 MED ORDER — INSULIN ASPART 100 UNIT/ML ~~LOC~~ SOLN
4.0000 [IU] | SUBCUTANEOUS | Status: DC
  Administered 2019-10-01 – 2019-10-13 (×68): 4 [IU] via SUBCUTANEOUS

## 2019-10-01 MED ORDER — FENTANYL 2500MCG IN NS 250ML (10MCG/ML) PREMIX INFUSION
50.0000 ug/h | INTRAVENOUS | Status: DC
  Administered 2019-10-01 (×2): 200 ug/h via INTRAVENOUS
  Administered 2019-10-02: 150 ug/h via INTRAVENOUS
  Administered 2019-10-02 – 2019-10-03 (×2): 200 ug/h via INTRAVENOUS
  Filled 2019-10-01 (×4): qty 250

## 2019-10-01 MED ORDER — CALCIUM GLUCONATE-NACL 1-0.675 GM/50ML-% IV SOLN
1.0000 g | Freq: Once | INTRAVENOUS | Status: AC
  Administered 2019-10-01: 1000 mg via INTRAVENOUS
  Filled 2019-10-01: qty 50

## 2019-10-01 MED ORDER — AMIODARONE LOAD VIA INFUSION
150.0000 mg | Freq: Once | INTRAVENOUS | Status: AC
  Administered 2019-10-01: 02:00:00 150 mg via INTRAVENOUS
  Filled 2019-10-01: qty 83.34

## 2019-10-01 MED ORDER — HEPARIN (PORCINE) 25000 UT/250ML-% IV SOLN
1900.0000 [IU]/h | INTRAVENOUS | Status: DC
  Administered 2019-10-01 – 2019-10-02 (×2): 1800 [IU]/h via INTRAVENOUS
  Filled 2019-10-01 (×2): qty 250

## 2019-10-01 MED ORDER — FENTANYL BOLUS VIA INFUSION
50.0000 ug | INTRAVENOUS | Status: DC | PRN
  Administered 2019-10-02 – 2019-10-03 (×4): 50 ug via INTRAVENOUS
  Filled 2019-10-01: qty 50

## 2019-10-01 MED ORDER — FUROSEMIDE 10 MG/ML IJ SOLN
60.0000 mg | Freq: Once | INTRAMUSCULAR | Status: AC
  Administered 2019-10-01: 60 mg via INTRAVENOUS
  Filled 2019-10-01: qty 6

## 2019-10-01 MED ORDER — MIDAZOLAM BOLUS VIA INFUSION
1.0000 mg | INTRAVENOUS | Status: DC | PRN
  Administered 2019-10-03 – 2019-10-06 (×4): 2 mg via INTRAVENOUS
  Filled 2019-10-01: qty 2

## 2019-10-01 MED ORDER — MIDAZOLAM 50MG/50ML (1MG/ML) PREMIX INFUSION
0.0000 mg/h | INTRAVENOUS | Status: DC
  Administered 2019-10-01 (×2): 5 mg/h via INTRAVENOUS
  Administered 2019-10-02: 6 mg/h via INTRAVENOUS
  Administered 2019-10-02 (×2): 4 mg/h via INTRAVENOUS
  Administered 2019-10-02: 6 mg/h via INTRAVENOUS
  Administered 2019-10-03: 8 mg/h via INTRAVENOUS
  Administered 2019-10-03 – 2019-10-04 (×3): 7 mg/h via INTRAVENOUS
  Administered 2019-10-04: 6 mg/h via INTRAVENOUS
  Administered 2019-10-05: 12:00:00 4 mg/h via INTRAVENOUS
  Administered 2019-10-05: 02:00:00 6 mg/h via INTRAVENOUS
  Administered 2019-10-06: 03:00:00 3 mg/h via INTRAVENOUS
  Filled 2019-10-01 (×14): qty 50

## 2019-10-01 MED ORDER — AMIODARONE HCL IN DEXTROSE 360-4.14 MG/200ML-% IV SOLN
60.0000 mg/h | INTRAVENOUS | Status: AC
  Administered 2019-10-01 (×3): 60 mg/h via INTRAVENOUS
  Filled 2019-10-01 (×2): qty 200

## 2019-10-01 MED ORDER — INSULIN GLARGINE 100 UNIT/ML ~~LOC~~ SOLN
12.0000 [IU] | Freq: Two times a day (BID) | SUBCUTANEOUS | Status: DC
  Administered 2019-10-01 – 2019-10-13 (×25): 12 [IU] via SUBCUTANEOUS
  Filled 2019-10-01 (×29): qty 0.12

## 2019-10-01 NOTE — Progress Notes (Signed)
Pt's head turned and arms rotated with no complications. ETT secured °

## 2019-10-01 NOTE — Plan of Care (Signed)
Discussed in front of then patient plan of care for the evening, lab values and medications with some teach back displayed by the daughters.  They were update through video call with their father

## 2019-10-01 NOTE — Progress Notes (Signed)
Shift Note:  Patient's is still not synchronism with the ventilator at times with a high respiratory rate in the 40's.  E-link RN and MD aware and that Vasopressin has been on delay most of the night with his episode of a-fib H\with a heart rate in the 160's. Patient may need to be placed back on his Nimbex.

## 2019-10-01 NOTE — Progress Notes (Signed)
Pt;s head turned and arms rotated wiath no complications. ETT secured.  No breakdown note.

## 2019-10-01 NOTE — Progress Notes (Signed)
Assisted tele visit to patient with daughter.  Laquasha Groome M, RN  

## 2019-10-01 NOTE — Progress Notes (Signed)
RN updated pt's daughters on POC. Daughters were updated this morning by Dr. Kendrick Fries.  RN will continue to monitor/assess pt

## 2019-10-01 NOTE — Progress Notes (Signed)
Pt returned to the supine position with RTx1 and RN x4. Pt ETT remains secure with cloth tape at 25cm at the lip. RT will continue to monitor.

## 2019-10-01 NOTE — Progress Notes (Signed)
Pts head and arms repositioned.ETT remains secure with cloth tape at 25cm at the lip. RT will continue to monitor.

## 2019-10-01 NOTE — Progress Notes (Signed)
eLink Physician-Brief Progress Note Patient Name: Roy Lee DOB: Jun 10, 1956 MRN: [id number]   Date of Service  10/01/2019  HPI/Events of Note  Afib with RVR up to 150s. Has history of Afib and is already on heparin drip for this. Is on vasopressin currently and has somewhat tenuous hemodynamics, especially in setting of proning/unproning.   eICU Interventions  Load with amio IV and follow with amiodarone infusion for control of Afib with RVR.     Intervention Category Major Interventions: Arrhythmia - evaluation and management  [name] 10/01/2019, 1:45 AM

## 2019-10-01 NOTE — Progress Notes (Signed)
ABG results given to Dr. Kendrick Fries. Verbal order received to not prone pt. PF ratio is 149. RT will continue to monitor.

## 2019-10-01 NOTE — Progress Notes (Signed)
eLink Physician-Brief Progress Note Patient Name: Roy Lee DOB: Aug 01, 1956 MRN: [id number]   Date of Service  10/01/2019  HPI/Events of Note  Notified of iCa level 1.14. Had episode of afib earlier currently sinus on amiodarone  eICU Interventions  Ordered calcium gluconate 1 g. Added Mg and Phos to am labs     Intervention Category Major Interventions: Electrolyte abnormality - evaluation and management Intermediate Interventions: Arrhythmia - evaluation and management  Rosalie Gums Synia Douglass 10/01/2019, 8:18 PM

## 2019-10-01 NOTE — Progress Notes (Signed)
ANTICOAGULATION CONSULT NOTE - Follow Up Consult  Pharmacy Consult for heparin (apixaban PTA)  Indication: H/o PE   No Known Allergies  Patient Measurements: Height: 5\' 11"  (180.3 cm) Weight: 282 lb 6.6 oz (128.1 kg) IBW/kg (Calculated) : 75.3 Heparin Dosing Weight: 101 kg   Vital Signs: Temp: 97.5 F (36.4 C) (02/21 1624) Temp Source: Esophageal (02/21 1345) BP: 101/76 (02/21 1624) Pulse Rate: 59 (02/21 1624)  Labs: Recent Labs    09/30/19 0400 09/30/19 0400 09/30/19 1330 09/30/19 1621 09/30/19 1621 09/30/19 2140 10/01/19 0300 10/01/19 1510  HGB 13.1   < >  --  10.9*   < >  --  13.0 10.7*  HCT 42.2   < >  --  32.0*  --   --  43.2 34.3*  PLT 199  --   --   --   --   --  240 162  APTT 56*   < > 70*  --   --  67* 96*  --   HEPARINUNFRC  --   --   --   --   --   --  1.72*  --   CREATININE 0.94  --   --   --   --   --  1.28* 1.37*   < > = values in this interval not displayed.    Estimated Creatinine Clearance: 77.2 mL/min (A) (by C-G formula based on SCr of 1.37 mg/dL (H)).   Assessment: 61 YOM with COVID continued on home apixaban for a h/o PE. Pharmacy was consulted to transition to IV heparin.   Hep was held this AM due to some rectal bleeding. Rn stated that bleeding has resolved. D/w Dr. 77 and we will resume IV heparin without bolus. Level was therapeutic this AM so we will use the same rate.    Goal of Therapy:  Heparin level 0.3-0.7 units/ml aPTT 66-102 seconds Monitor platelets by anticoagulation protocol: Yes   Plan:  Resume heparin at 1800 units/hr F/u with 6 hr PTT F/u with bleeding Daily PTT and HL  Kendrick Fries, PharmD, [place], AAHIVP, CPP Infectious Disease Pharmacist 10/01/2019 4:35 PM

## 2019-10-01 NOTE — Progress Notes (Signed)
NAME:  Roy Lee, MRN:  [id number], DOB:  24-Dec-1955, LOS: 9 ADMISSION DATE:  10/05/2019, CONSULTATION DATE:  2/16 REFERRING MD:  Aileen Fass, CHIEF COMPLAINT:  Dyspnea   Brief History   64y/o male treated for COVID 19 pneumonia and PE in January, treated with actemra, steroids, antivirals and dc'd home on 3-5 L O2 and anticoagulation.  Returned to the ED 2/12 with worsening dyspnea and hypoxemia.  Moved to the ICU on 2/16 in the setting of worsening hypoxemia and atrial fibrillation with RVR.  Past Medical History  Obesity Hypertension arthitis  Significant Hospital Events   1/24 original admission, treated with actemra, steroids, antivirals 1/30 d/c home on 3-5 L O2 2/12 return to ED, admission, treated with ceftriaxone/azithro 2/16 move to ICU, intubation, bronchoscopy, prone, afib with rvr started dilt 2/18 fever, started antibiotics 2/21 early AM bright red blood per rectum, vent dyssynchrony Plateau pressure 34, afib with rvr started on amiodarone, nearly off vasopressors  Consults:  PCCM  Procedures:  2/16 ETT >  2/16 L IJ CVL >   Significant Diagnostic Tests:  2/12 CT angiogram chest > no PE, bilateral infiltrates, nodular, progressed compared to 2/6 film 2/16 CT chest> significant progression of bilateral ground glass and consolidation compared to 2/12 film 2/17 Echo> RV dilation and RV pressure overload, LVEF normal   Micro Data:  2/12 blood > negative 2/14 resp culture > GPC in pair, cluster, GPR, GNR 2/16 BAL > ngtd 2/16 BAL fungal > ngtd 2/16 BAL AFB > ngtd  2/18 blood >  2/18 resp culture >   Antimicrobials:  2/12  Ceftriaxone > 2/16  2/12  Azithro > 2/16   2/18 cefepime >  2/18 vanc >   Interim history/subjective:   2/21 early AM bright red blood per rectum, vent dyssynchrony Plateau pressure 34, afib with rvr started on amiodarone, nearly off vasopressor BUN up, Cr up CVP 14 Heparin on hold  Objective   Blood pressure (!) 86/65, pulse  79, temperature 99.1 F (37.3 C), resp. rate (!) 23, height _0  (1.803 m), weight 128.1 kg, SpO2 93 %. CVP:  [14 mmHg-15 mmHg] 14 mmHg  Vent Mode: PCV FiO2 (%):  [70 %-100 %] 100 % Set Rate:  [35 bmp] 35 bmp PEEP:  [12 cmH20] 12 cmH20 Plateau Pressure:  [33 cmH20-38 cmH20] 33 cmH20   Intake/Output Summary (Last 24 hours) at 10/01/2019 0844 Last data filed at 10/01/2019 0600 Gross per 24 hour  Intake 3856.72 ml  Output 1235 ml  Net 2621.72 ml   Filed Weights   09/28/19 0500 09/30/19 0436 10/01/19 0227  Weight: 121.7 kg 127.6 kg 128.1 kg    Examination:  General:  In bed on vent, prone HENT: NCAT ETT in place PULM: Wheezing, crackles bilaterally B, vent supported breathing CV: prone GI: prone MSK: normal bulk and tone Neuro: sedated on vent   2/21 CXR personally reviewed, severe bilateral airspace disase, worse on R today?  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, late phase disease Plateau 34 on 2/21, acceptable Worsening vent dyssynchrony off nimbex Continue mechanical ventilation per ARDS protocol Target TVol 6-8cc/kgIBW Target Plateau Pressure < 30cm H20 Target driving pressure less than 15 cm of water Target PaO2 55-65: titrate PEEP/FiO2 per protocol As long as PaO2 to FiO2 ratio is less than 1:150 position in prone position for 16 hours a day Check CVP daily if CVL in place Target CVP less than 4,  diurese as necessary Ventilator associated pneumonia prevention protocol 2/21 tolerating dyssynchrony fairly well, plateau unchanged, needs higher FiO2 but likely a reasonable trade off compared to needing neuromuscular blockade, supine ABG today  Atrial fibrillation with RVR > required amiodarone, doing well on that Tele D/c dilt order Continue admiodaonre  PE Hold heparin with bleeding on 2/21 Monitor for bleeding Transfuse PRBC for Hgb < 7 gm/dL  Epistaxis 2/19 > none since  Lower GI bleed on 2/21, Hgb  stable, hemodynamically stable, hemorrhoid? Hold heparin for now If no rebleeding then restart heparin in PM Repeat CBC this afternoon D/c afrin  Hyperkalemia, Oliguric renal failure, AKI Azotemia Volume overloaded Lasix x1 dose Repeat BMET later today Monitor BMET and UOP Replace electrolytes as needed  Circulatory shock/Septic shock: improved, nearly resolved RV overload due to severe ARDS Stop vasopressin If MAP < 65 or SBP < 90 then use norepinephrine given V failure Continue stress dose steroids  LFT abnormalities today> AST better, ALT down slightly compared to yesterday morning, picture most consistent with shock liver Repeat in AM  Hyperglycemia with prediabetes Increase long acting and add tube feeding coverage Continue resistant scale SSI  Need for sedation for mechanical ventilation Some vent dyssynchrony Change to PAD protocol, RASS target -3 Fentanyl/versed  Prognosis guarded  Best practice:  Diet: tube feeding Pain/Anxiety/Delirium protocol (if indicated): yes, RASS target -5, as above VAP protocol (if indicated): yes DVT prophylaxis: eliquis GI prophylaxis: Pantoprazole for stress ulcer prophylaxis Glucose control: SSI Mobility: bed rest Code Status: full Family Communication: updated daughters by phone on 2/21 Disposition: remain in ICU  Labs   CBC: Recent Labs  Lab 09/25/19 0341 09/25/19 0341 09/26/19 0318 09/26/19 1136 09/27/19 0500 09/27/19 1110 09/28/19 0445 09/28/19 1253 09/29/19 0801 09/29/19 1535 09/30/19 0400 09/30/19 1621 10/01/19 0300  WBC 9.9   < > 9.0  --  7.5  --  7.2  --  10.5  --  13.1*  --  27.0*  NEUTROABS 9.0*  --  7.0  --  5.9  --   --   --   --   --   --   --  18.0*  HGB 14.1   < > 15.6   < > 13.0   < > 12.4*   < > 12.5* 12.6* 13.1 10.9* 13.0  HCT 41.9   < > 46.4   < > 39.4   < > 40.6   < > 40.5 37.0* 42.2 32.0* 43.2  MCV 89.1   < > 89.6  --  91.2  --  97.4  --  99.8  --  95.9  --  99.5  PLT 139*   < > 128*  --   108*  --  123*  --  162  --  199  --  240   < > = values in this interval not displayed.    Basic Metabolic Panel: Recent Labs  Lab 09/25/19 0341 09/25/19 0341 09/26/19 0318 09/26/19 1136 09/26/19 1730 09/27/19 0103 09/27/19 0500 09/27/19 1110 09/27/19 1750 09/27/19 1940 09/28/19 0445 09/28/19 1253 09/29/19 0801 09/29/19 1535 09/30/19 0400 09/30/19 1621 10/01/19 0300  NA 139   < > 138   < >  --    < > 138   < >  --    < > 140   < > 137 136 137 136 143  K 4.9   < > 4.0   < >  --    < > 4.2   < >  --    < >  5.1   < > 5.6* 5.0 5.5* 4.9 5.5*  CL 105   < > 100  --   --   --  94*  --   --   --  89*  --  92*  --  93*  --  95*  CO2 26   < > 27  --   --   --  36*  --   --   --  42*  --  38*  --  36*  --  39*  GLUCOSE 138*   < > 138*  --   --   --  216*  --   --   --  158*  --  231*  --  233*  --  315*  BUN 24*   < > 22  --   --   --  31*  --   --   --  48*  --  57*  --  56*  --  72*  CREATININE 0.74   < > 0.78  --   --   --  0.79  --   --   --  1.58*  --  1.12  --  0.94  --  1.28*  CALCIUM 8.4*   < > 8.4*  --   --   --  7.5*  --   --   --  7.8*  --  7.5*  --  7.8*  --  8.1*  MG 2.1  --  2.0  --  2.1  --  2.1  --  2.5*  --   --   --   --   --   --   --   --   PHOS 2.9  --  2.6  --  5.2*  --  3.0  --  5.0*  --   --   --   --   --   --   --   --    < > = values in this interval not displayed.   GFR: Estimated Creatinine Clearance: 82.6 mL/min (A) (by C-G formula based on SCr of 1.28 mg/dL (H)). Recent Labs  Lab 09/28/19 0445 09/29/19 0320 09/29/19 0801 09/30/19 0400 10/01/19 0300  PROCALCITON 3.80 2.21  --  1.34  --   WBC 7.2  --  10.5 13.1* 27.0*    Liver Function Tests: Recent Labs  Lab 09/28/19 0445 09/29/19 0801 09/30/19 0400 09/30/19 1645 10/01/19 0300  AST 21 69* 194* 103* 102*  ALT 33 92* 313* 245* 277*  ALKPHOS 72 96 115 103 126  BILITOT 0.8 0.4 0.8 0.6 0.8  PROT 5.3* 4.8* 5.8* 5.1* 6.3*  ALBUMIN 2.4* 2.2* 2.3* 2.2* 2.8*   No results for input(s): LIPASE,  AMYLASE in the last 168 hours. No results for input(s): AMMONIA in the last 168 hours.  ABG    Component Value Date/Time   PHART 7.441 09/30/2019 1621   PCO2ART 55.4 (H) 09/30/2019 1621   PO2ART 63.0 (L) 09/30/2019 1621   HCO3 37.9 (H) 09/30/2019 1621   TCO2 40 (H) 09/30/2019 1621   ACIDBASEDEF 7.0 (H) 09/26/2019 1136   O2SAT 92.0 09/30/2019 1621     Coagulation Profile: No results for input(s): INR, PROTIME in the last 168 hours.  Cardiac Enzymes: No results for input(s): CKTOTAL, CKMB, CKMBINDEX, TROPONINI in the last 168 hours.  HbA1C: Hgb A1c MFr Bld  Date/Time Value Ref Range Status  09/06/2019 11:40 AM 6.3 (H) 4.8 - 5.6 % Final  Comment:    (NOTE) Pre diabetes:          5.7%-6.4% Diabetes:              >6.4% Glycemic control for   <7.0% adults with diabetes     CBG: Recent Labs  Lab 09/30/19 1104 09/30/19 1615 09/30/19 1949 09/30/19 2310 10/01/19 0812  GLUCAP 252* 266* 245* 245* 266*     Critical care time: 35 minutes     Roselie Awkward, MD [facility] Pager: [phone] Cell: [phone] If no response, call [phone]

## 2019-10-02 ENCOUNTER — Inpatient Hospital Stay (HOSPITAL_COMMUNITY): Payer: 59

## 2019-10-02 DIAGNOSIS — R0602 Shortness of breath: Secondary | ICD-10-CM

## 2019-10-02 LAB — COMPREHENSIVE METABOLIC PANEL
ALT: 255 U/L — ABNORMAL HIGH (ref 0–44)
AST: 89 U/L — ABNORMAL HIGH (ref 15–41)
Albumin: 2.3 g/dL — ABNORMAL LOW (ref 3.5–5.0)
Alkaline Phosphatase: 91 U/L (ref 38–126)
Anion gap: 8 (ref 5–15)
BUN: 76 mg/dL — ABNORMAL HIGH (ref 8–23)
CO2: 35 mmol/L — ABNORMAL HIGH (ref 22–32)
Calcium: 8.1 mg/dL — ABNORMAL LOW (ref 8.9–10.3)
Chloride: 96 mmol/L — ABNORMAL LOW (ref 98–111)
Creatinine, Ser: 1.23 mg/dL (ref 0.61–1.24)
GFR calc Af Amer: >60 mL/min (ref >60)
GFR calc non Af Amer: >60 mL/min (ref >60)
Glucose, Bld: 191 mg/dL — ABNORMAL HIGH (ref 70–99)
Potassium: 4.3 mmol/L (ref 3.5–5.1)
Sodium: 139 mmol/L (ref 135–145)
Total Bilirubin: 0.6 mg/dL (ref 0.3–1.2)
Total Protein: 5.5 g/dL — ABNORMAL LOW (ref 6.5–8.1)

## 2019-10-02 LAB — CBC WITH DIFFERENTIAL/PLATELET
Abs Immature Granulocytes: 0.76 10*3/uL — ABNORMAL HIGH (ref 0.00–0.07)
Basophils Absolute: 0.1 10*3/uL (ref 0.0–0.1)
Basophils Relative: 1 %
Eosinophils Absolute: 0 10*3/uL (ref 0.0–0.5)
Eosinophils Relative: 0 %
HCT: 33.3 % — ABNORMAL LOW (ref 39.0–52.0)
Hemoglobin: 10.6 g/dL — ABNORMAL LOW (ref 13.0–17.0)
Immature Granulocytes: 6 %
Lymphocytes Relative: 8 %
Lymphs Abs: 1 10*3/uL (ref 0.7–4.0)
MCH: 30.2 pg (ref 26.0–34.0)
MCHC: 31.8 g/dL (ref 30.0–36.0)
MCV: 94.9 fL (ref 80.0–100.0)
Monocytes Absolute: 0.7 10*3/uL (ref 0.1–1.0)
Monocytes Relative: 6 %
Neutro Abs: 10.1 10*3/uL — ABNORMAL HIGH (ref 1.7–7.7)
Neutrophils Relative %: 79 %
Platelets: 154 10*3/uL (ref 150–400)
RBC: 3.51 MIL/uL — ABNORMAL LOW (ref 4.22–5.81)
RDW: 16 % — ABNORMAL HIGH (ref 11.5–15.5)
WBC: 12.7 10*3/uL — ABNORMAL HIGH (ref 4.0–10.5)
nRBC: 0.6 % — ABNORMAL HIGH (ref 0.0–0.2)

## 2019-10-02 LAB — GLUCOSE, CAPILLARY
Glucose-Capillary: 293 mg/dL — ABNORMAL HIGH (ref 70–99)
Glucose-Capillary: 197 mg/dL — ABNORMAL HIGH (ref 70–99)
Glucose-Capillary: 186 mg/dL — ABNORMAL HIGH (ref 70–99)
Glucose-Capillary: 167 mg/dL — ABNORMAL HIGH (ref 70–99)
Glucose-Capillary: 112 mg/dL — ABNORMAL HIGH (ref 70–99)
Glucose-Capillary: 149 mg/dL — ABNORMAL HIGH (ref 70–99)

## 2019-10-02 LAB — PROTIME-INR
INR: 1.2 (ref 0.8–1.2)
Prothrombin Time: 15.4 seconds — ABNORMAL HIGH (ref 11.4–15.2)

## 2019-10-02 LAB — PHOSPHORUS: Phosphorus: 2.3 mg/dL — ABNORMAL LOW (ref 2.5–4.6)

## 2019-10-02 LAB — APTT
aPTT: 30 seconds (ref 24–36)
aPTT: 31 seconds (ref 24–36)
aPTT: 31 seconds (ref 24–36)
aPTT: 76 seconds — ABNORMAL HIGH (ref 24–36)

## 2019-10-02 LAB — MAGNESIUM: Magnesium: 2.8 mg/dL — ABNORMAL HIGH (ref 1.7–2.4)

## 2019-10-02 LAB — HEPARIN LEVEL (UNFRACTIONATED)
Heparin Unfractionated: 0.61 IU/mL (ref 0.30–0.70)
Heparin Unfractionated: 0.41 IU/mL (ref 0.30–0.70)
Heparin Unfractionated: 0.32 IU/mL (ref 0.30–0.70)

## 2019-10-02 MED ORDER — FUROSEMIDE 10 MG/ML IJ SOLN
60.0000 mg | Freq: Four times a day (QID) | INTRAMUSCULAR | Status: AC
  Administered 2019-10-02 (×2): 60 mg via INTRAVENOUS
  Filled 2019-10-02 (×2): qty 6

## 2019-10-02 MED ORDER — HEPARIN (PORCINE) 25000 UT/250ML-% IV SOLN
2400.0000 [IU]/h | INTRAVENOUS | Status: DC
  Administered 2019-10-02: 16:00:00 2100 [IU]/h via INTRAVENOUS
  Administered 2019-10-03: 2400 [IU]/h via INTRAVENOUS
  Filled 2019-10-02: qty 250

## 2019-10-02 NOTE — Progress Notes (Signed)
NAME:  Roy Lee, MRN:  [id number], DOB:  Jan 10, 1956, LOS: 70 ADMISSION DATE:  10/05/2019, CONSULTATION DATE:  2/16 REFERRING MD:  Aileen Fass, CHIEF COMPLAINT:  Dyspnea   Brief History   64y/o male treated for COVID 19 pneumonia and PE in January, treated with actemra, steroids, antivirals and dc'd home on 3-5 L O2 and anticoagulation.  Returned to the ED 2/12 with worsening dyspnea and hypoxemia.  Moved to the ICU on 2/16 in the setting of worsening hypoxemia and atrial fibrillation with RVR.  Past Medical History  Obesity Hypertension arthitis  Significant Hospital Events   1/24 original admission, treated with actemra, steroids, antivirals 1/30 d/c home on 3-5 L O2 2/12 return to ED, admission, treated with ceftriaxone/azithro 2/16 move to ICU, intubation, bronchoscopy, prone, afib with rvr started dilt 2/18 fever, started antibiotics 2/21 early AM bright red blood per rectum, vent dyssynchrony Plateau pressure 34, afib with rvr started on amiodarone, nearly off vasopressors, held prone because oxygeantion better  Consults:  PCCM  Procedures:  2/16 ETT >  2/16 L IJ CVL >   Significant Diagnostic Tests:  2/12 CT angiogram chest > no PE, bilateral infiltrates, nodular, progressed compared to 2/6 film 2/16 CT chest> significant progression of bilateral ground glass and consolidation compared to 2/12 film 2/17 Echo> RV dilation and RV pressure overload, LVEF normal   Micro Data:  2/12 blood > negative 2/14 resp culture > GPC in pair, cluster, GPR, GNR 2/16 BAL > ngtd 2/16 BAL fungal > ngtd 2/16 BAL AFB > ngtd  2/18 blood >  2/18 resp culture >   Antimicrobials:  2/12  Ceftriaxone > 2/16  2/12  Azithro > 2/16   2/18 cefepime >  2/18 vanc >   Interim history/subjective:   Oxygenation improved Held off on proning  Objective   Blood pressure 105/74, pulse (!) 57, temperature 98.6 F (37 C), temperature source Esophageal, resp. rate (!) 29, height _0   (1.803 m), weight (!) 139.8 kg, SpO2 96 %. CVP:  [17 mmHg-24 mmHg] 19 mmHg  Vent Mode: PCV FiO2 (%):  [70 %-100 %] 70 % Set Rate:  [35 bmp] 35 bmp PEEP:  [12 cmH20] 12 cmH20 Plateau Pressure:  [36 cmH20-46 cmH20] 40 cmH20   Intake/Output Summary (Last 24 hours) at 10/02/2019 0853 Last data filed at 10/02/2019 0813 Gross per 24 hour  Intake 5009.06 ml  Output 2350 ml  Net 2659.06 ml   Filed Weights   09/30/19 0436 10/01/19 0227 10/02/19 0205  Weight: (!) 141.5 kg (!) 141.7 kg (!) 139.8 kg    Examination:  General:  In bed on vent HENT: NCAT ETT in place PULM: Crackles bases B, vent supported breathing CV: RRR, no mgr GI: BS+, soft, nontender MSK: normal bulk and tone Neuro: sedated on vent  2/22 CXR personally reviewed, severe bilateral airspace diease, ett in place  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, late phase disease Plateau 34 on 2/21, acceptable Worsening vent dyssynchrony off nimbex Continue mechanical ventilation per ARDS protocol Target TVol 6-8cc/kgIBW Target Plateau Pressure < 30cm H20 Target driving pressure less than 15 cm of water Target PaO2 55-65: titrate PEEP/FiO2 per protocol As long as PaO2 to FiO2 ratio is less than 1:150 position in prone position for 16 hours a day Check CVP daily if CVL in place Target CVP less than 4, diurese as necessary Ventilator associated pneumonia prevention protocol 2/21 hold prone positioning further  as oxygenation improved, wean per protocol  Atrial fibrillation with RVR > required amiodarone, doing well on that Tele Continue amiodarone  PE Heparin in fusion Monitor for bleeding Transfuse PRBC for Hgb < 7 gm/dL  Epistaxis 2/19 > none since  Lower GI bleed on 2/21,resolved, hemorrhoid? Monitor for bleeding  Oliguric renal failure, AKI > improving Azotemia Volume overloaded Lasix again today > 2 doses, increase dose to 48m Monitor BMET and  UOP Replace electrolytes as needed  Circulatory shock/Septic shock: resolved RV overload due to severe ARDS Tele Continue stress dose steroids for now Monitor hemodynamics  LFT abnormalities today> AST better, ALT down slightly compared to yesterday morning, picture most consistent with shock liver Repeat in AM  Hyperglycemia with prediabetes Continue long acting, tube feeding coverage and SSI  Need for sedation for mechanical ventilation Some vent dyssynchrony PAD protocol, RASS target -3 Fentanyl/versed infusions   Best practice:  Diet: tube feeding Pain/Anxiety/Delirium protocol (if indicated): yes, RASS target -5, as above VAP protocol (if indicated): yes DVT prophylaxis: eliquis GI prophylaxis: Pantoprazole for stress ulcer prophylaxis Glucose control: SSI Mobility: bed rest Code Status: full Family Communication: updated daughters by phone on 2/21 Disposition: remain in ICU  Labs   CBC: Recent Labs  Lab 09/26/19 0318 09/26/19 1136 09/27/19 0500 09/27/19 1110 09/29/19 0801 09/29/19 1535 09/30/19 0400 09/30/19 0400 09/30/19 1621 10/01/19 0300 10/01/19 1510 10/01/19 1748 10/02/19 0430  WBC 9.0  --  7.5   < > 10.5  --  13.1*  --   --  27.0* 14.1*  --  12.7*  NEUTROABS 7.0  --  5.9  --   --   --   --   --   --  18.0*  --   --  10.1*  HGB 15.6   < > 13.0   < > 12.5*   < > 13.1   < > 10.9* 13.0 10.7* 10.5* 10.6*  HCT 46.4   < > 39.4   < > 40.5   < > 42.2   < > 32.0* 43.2 34.3* 31.0* 33.3*  MCV 89.6  --  91.2   < > 99.8  --  95.9  --   --  99.5 95.8  --  94.9  PLT 128*  --  108*   < > 162  --  199  --   --  240 162  --  154   < > = values in this interval not displayed.    Basic Metabolic Panel: Recent Labs  Lab 09/26/19 0318 09/26/19 1136 09/26/19 1730 09/27/19 0103 09/27/19 0500 09/27/19 1110 09/27/19 1750 09/27/19 1940 09/29/19 0801 09/29/19 1535 09/30/19 0400 09/30/19 0400 09/30/19 1621 10/01/19 0300 10/01/19 1510 10/01/19 1748  10/02/19 0430  NA 138   < >  --    < > 138   < >  --    < > 137   < > 137   < > 136 143 139 136 139  K 4.0   < >  --    < > 4.2   < >  --    < > 5.6*   < > 5.5*   < > 4.9 5.5* 4.6 4.0 4.3  CL 100  --   --   --  94*  --   --    < > 92*  --  93*  --   --  95* 96*  --  96*  CO2 27  --   --   --  36*  --   --    < > 38*  --  36*  --   --  39* 35*  --  35*  GLUCOSE 138*  --   --   --  216*  --   --    < > 231*  --  233*  --   --  315* 214*  --  191*  BUN 22  --   --   --  31*  --   --    < > 57*  --  56*  --   --  72* 77*  --  76*  CREATININE 0.78  --   --   --  0.79  --   --    < > 1.12  --  0.94  --   --  1.28* 1.37*  --  1.23  CALCIUM 8.4*  --   --   --  7.5*  --   --    < > 7.5*  --  7.8*  --   --  8.1* 7.7*  --  8.1*  MG 2.0  --  2.1  --  2.1  --  2.5*  --   --   --   --   --   --   --   --   --  2.8*  PHOS 2.6  --  5.2*  --  3.0  --  5.0*  --   --   --   --   --   --   --   --   --  2.3*   < > = values in this interval not displayed.   GFR: Estimated Creatinine Clearance: 90.2 mL/min (by C-G formula based on SCr of 1.23 mg/dL). Recent Labs  Lab 09/28/19 0445 09/29/19 0320 09/29/19 0801 09/30/19 0400 10/01/19 0300 10/01/19 1510 10/02/19 0430  PROCALCITON 3.80 2.21  --  1.34  --   --   --   WBC 7.2  --    < > 13.1* 27.0* 14.1* 12.7*   < > = values in this interval not displayed.    Liver Function Tests: Recent Labs  Lab 09/29/19 0801 09/30/19 0400 09/30/19 1645 10/01/19 0300 10/02/19 0430  AST 69* 194* 103* 102* 89*  ALT 92* 313* 245* 277* 255*  ALKPHOS 96 115 103 126 91  BILITOT 0.4 0.8 0.6 0.8 0.6  PROT 4.8* 5.8* 5.1* 6.3* 5.5*  ALBUMIN 2.2* 2.3* 2.2* 2.8* 2.3*   No results for input(s): LIPASE, AMYLASE in the last 168 hours. No results for input(s): AMMONIA in the last 168 hours.  ABG    Component Value Date/Time   PHART 7.379 10/01/2019 1748   PCO2ART 63.4 (H) 10/01/2019 1748   PO2ART 149.0 (H) 10/01/2019 1748   HCO3 37.6 (H) 10/01/2019 1748   TCO2 40 (H)  10/01/2019 1748   ACIDBASEDEF 7.0 (H) 09/26/2019 1136   O2SAT 99.0 10/01/2019 1748     Coagulation Profile: Recent Labs  Lab 10/02/19 0430  INR 1.2    Cardiac Enzymes: No results for input(s): CKTOTAL, CKMB, CKMBINDEX, TROPONINI in the last 168 hours.  HbA1C: Hgb A1c MFr Bld  Date/Time Value Ref Range Status  09/06/2019 11:40 AM 6.3 (H) 4.8 - 5.6 % Final    Comment:    (NOTE) Pre diabetes:          5.7%-6.4% Diabetes:              >6.4% Glycemic control for   <7.0%  adults with diabetes     CBG: Recent Labs  Lab 10/01/19 1524 10/01/19 1945 10/01/19 2335 10/02/19 0330 10/02/19 0758  GLUCAP 242* 148* 169* 197* 186*     Critical care time: 35 minutes     Roselie Awkward, MD [facility] Pager: [phone] Cell: [phone] If no response, call [phone]

## 2019-10-02 NOTE — Progress Notes (Addendum)
ANTICOAGULATION CONSULT NOTE - Follow Up Consult  Pharmacy Consult for heparin (apixaban PTA)  Indication: H/o PE   Assessment: 59 YOM with COVID continued on home apixaban for a h/o PE. Pharmacy was consulted to transition to IV heparin.   Hep was held this AM due to some rectal bleeding. Heparin resumed later in the day.  APTT 76 sec   Goal of Therapy:  Heparin level 0.3-0.7 units/ml aPTT 66-102 seconds Monitor platelets by anticoagulation protocol: Yes   Plan:  Continue heparin at 1800 units/hr F/u with bleeding Daily PTT and HL  Thanks for allowing pharmacy to be a part of this patient's care.  Talbert Cage, PharmD Clinical Pharmacist 10/02/2019 1:00 AM  Addum:  APTT this am 31 sec.  Heparin level still falsely elevated.  Will cautiously increase heparin to 1900 units/hr. Check heparin level ~ 6 hours after rate change

## 2019-10-02 NOTE — Progress Notes (Signed)
[facility]  Attempted to call daughter, had to leave message  Heber Seaside, MD [facility] Pager: [phone] Cell: [phone] If no response, call [phone]

## 2019-10-02 NOTE — Progress Notes (Signed)
ANTICOAGULATION CONSULT NOTE - Follow Up Consult  Pharmacy Consult for Heparin IV Indication: Hx PE (apixaban PTA), Afib  No Known Allergies  Patient Measurements: Height: 5\' 11"  (180.3 cm) Weight: (!) 308 lb 3.3 oz (139.8 kg)(corrected) IBW/kg (Calculated) : 75.3 Heparin Dosing Weight: 108 kg  Vital Signs: Temp: 98.2 F (36.8 C) (02/22 1400) Temp Source: Esophageal (02/22 0800) BP: 103/67 (02/22 1400) Pulse Rate: 65 (02/22 1400)  Labs: Recent Labs    10/01/19 0300 10/01/19 0300 10/01/19 1510 10/01/19 1510 10/01/19 1748 10/01/19 2340 10/02/19 0430  HGB 13.0   < > 10.7*   < > 10.5*  --  10.6*  HCT 43.2   < > 34.3*  --  31.0*  --  33.3*  PLT 240  --  162  --   --   --  154  APTT 96*  --   --   --   --  76* 31  LABPROT  --   --   --   --   --   --  15.4*  INR  --   --   --   --   --   --  1.2  HEPARINUNFRC 1.72*  --   --   --   --   --  0.61  CREATININE 1.28*  --  1.37*  --   --   --  1.23   < > = values in this interval not displayed.    Estimated Creatinine Clearance: 90.2 mL/min (by C-G formula based on SCr of 1.23 mg/dL).   Medications: Infusions:  . amiodarone 30 mg/hr (10/02/19 1400)  . ceFEPime (MAXIPIME) IV 2 g (10/02/19 1411)  . feeding supplement (VITAL AF 1.2 CAL) 65 mL/hr at 10/02/19 0600  . fentaNYL infusion INTRAVENOUS 200 mcg/hr (10/02/19 1400)  . heparin 1,900 Units/hr (10/02/19 1400)  . midazolam 6 mg/hr (10/02/19 1400)  . norepinephrine (LEVOPHED) Adult infusion Stopped (10/02/19 1326)  . phenylephrine (NEO-SYNEPHRINE) Adult infusion Stopped (09/30/19 1803)  . vancomycin Stopped (10/02/19 0300)  . vasopressin (PITRESSIN) infusion - *FOR SHOCK* Stopped (10/01/19 10/03/19)    Assessment: 7 YOM with COVID initially continued on home apixaban for a h/o PE. Pharmacy was consulted to transition to IV heparin.  Heparin was held on 2/21 for rectal bleeding, but resolved on 2/21 and heparin restarted.  Today, 10/02/2019: APTT 30 remains low despite  previous rate increase to heparin at 1900 units/hr. Heparin level 0.41, decreased to therapeutic range, but may be falsely elevated d/t apixaban (last dose on 2/19 at 8am) CBC:  Hgb decreased from 12-13 range down to 10's on 2/21, but now remains low/stable today.  Plt decreased to 154k. No further bleeding or complications reported.  Goal of Therapy:  Heparin level 0.3-0.7 units/ml aPTT 66-102 seconds Monitor platelets by anticoagulation protocol: Yes   Plan:  Increase to heparin IV infusion at 2100 units/hr aPTT,Heparin level 6 hours after rate change Daily heparin level and CBC   3/21 PharmD, BCPS Clinical pharmacist phone 7am- 5pm: [phone] 10/02/2019 3:26 PM

## 2019-10-02 NOTE — Progress Notes (Signed)
eLink Physician-Brief Progress Note Patient Name: WLLIAM GROSSO DOB: 1955-11-13 MRN: [id number]   Date of Service  10/02/2019  HPI/Events of Note  Notified of foley being in place and patient being diuresed  eICU Interventions  Ordered to continue foley for now     Intervention Category Intermediate Interventions: Best-practice therapies (e.g. DVT, beta blocker, etc.)  Irving Burton T Cayce Paschal 10/02/2019, 6:30 AM

## 2019-10-02 NOTE — Progress Notes (Signed)
PT Cancellation Note  Patient Details Name: Roy Lee MRN: [id number] DOB: May 22, 1956   Cancelled Treatment:    Reason Eval/Treat Not Completed: Medical issues which prohibited therapy;Patient not medically ready;Other (comment)(Pt continues to be intubated (PEEP 12) and sedated. Vent set)  Pt continues to be intubated (PEEP 12) and FiO2 70%. Vent settings to high with pt not appropriate for therapy. PT will sign off at this time. Please re-order therapy when pt is medically stable and able to participate.   Roy Lee PT, DPT Acute Re[facility] P: [phone]   Roy Lee 10/02/2019, 8:56 AM

## 2019-10-02 NOTE — Progress Notes (Signed)
ANTICOAGULATION CONSULT NOTE - Follow Up Consult  Pharmacy Consult for heparin (apixaban PTA)  Indication: H/o PE   Assessment: 47 YOM with COVID continued on home apixaban for a h/o PE. Pharmacy was consulted to transition to IV heparin.   APTT remains low despite rate increase.  No issues with bleeding, no issues with infusion  Goal of Therapy:  Heparin level 0.3-0.7 units/ml aPTT 66-102 seconds Monitor platelets by anticoagulation protocol: Yes   Plan:  Increase heparin to 2400 units/hr Daily PTT and HL Monitor for bleeding complications  Thanks for allowing pharmacy to be a part of this patient's care.  Talbert Cage, PharmD Clinical Pharmacist 10/02/2019 11:51 PM

## 2019-10-02 NOTE — Plan of Care (Signed)
Discussed with family iver the phone in front of patient plan of care for the evening, ventilator settings and current medications with some teach back displayed and all questions answered at this time.  Patient had video conference call with his good friend Caryn Bee

## 2019-10-03 DIAGNOSIS — R0603 Acute respiratory distress: Secondary | ICD-10-CM

## 2019-10-03 LAB — BASIC METABOLIC PANEL
Anion gap: 8 (ref 5–15)
BUN: 69 mg/dL — ABNORMAL HIGH (ref 8–23)
CO2: 36 mmol/L — ABNORMAL HIGH (ref 22–32)
Calcium: 7.9 mg/dL — ABNORMAL LOW (ref 8.9–10.3)
Chloride: 95 mmol/L — ABNORMAL LOW (ref 98–111)
Creatinine, Ser: 1.02 mg/dL (ref 0.61–1.24)
GFR calc Af Amer: >60 mL/min (ref >60)
GFR calc non Af Amer: >60 mL/min (ref >60)
Glucose, Bld: 179 mg/dL — ABNORMAL HIGH (ref 70–99)
Potassium: 4.1 mmol/L (ref 3.5–5.1)
Sodium: 139 mmol/L (ref 135–145)

## 2019-10-03 LAB — CBC WITH DIFFERENTIAL/PLATELET
Abs Immature Granulocytes: 1.01 10*3/uL — ABNORMAL HIGH (ref 0.00–0.07)
Basophils Absolute: 0.1 10*3/uL (ref 0.0–0.1)
Basophils Relative: 1 %
Eosinophils Absolute: 0 10*3/uL (ref 0.0–0.5)
Eosinophils Relative: 0 %
HCT: 32.9 % — ABNORMAL LOW (ref 39.0–52.0)
Hemoglobin: 10.4 g/dL — ABNORMAL LOW (ref 13.0–17.0)
Immature Granulocytes: 8 %
Lymphocytes Relative: 8 %
Lymphs Abs: 1 10*3/uL (ref 0.7–4.0)
MCH: 30.1 pg (ref 26.0–34.0)
MCHC: 31.6 g/dL (ref 30.0–36.0)
MCV: 95.1 fL (ref 80.0–100.0)
Monocytes Absolute: 0.6 10*3/uL (ref 0.1–1.0)
Monocytes Relative: 5 %
Neutro Abs: 10 10*3/uL — ABNORMAL HIGH (ref 1.7–7.7)
Neutrophils Relative %: 78 %
Platelets: 164 10*3/uL (ref 150–400)
RBC: 3.46 MIL/uL — ABNORMAL LOW (ref 4.22–5.81)
RDW: 17 % — ABNORMAL HIGH (ref 11.5–15.5)
WBC: 12.7 10*3/uL — ABNORMAL HIGH (ref 4.0–10.5)
nRBC: 0.6 % — ABNORMAL HIGH (ref 0.0–0.2)

## 2019-10-03 LAB — CULTURE, BLOOD (ROUTINE X 2)
Culture: NO GROWTH
Culture: NO GROWTH
Special Requests: ADEQUATE
Special Requests: ADEQUATE

## 2019-10-03 LAB — HEPARIN LEVEL (UNFRACTIONATED)
Heparin Unfractionated: 0.87 IU/mL — ABNORMAL HIGH (ref 0.30–0.70)
Heparin Unfractionated: 1 IU/mL — ABNORMAL HIGH (ref 0.30–0.70)

## 2019-10-03 LAB — GLUCOSE, CAPILLARY
Glucose-Capillary: 126 mg/dL — ABNORMAL HIGH (ref 70–99)
Glucose-Capillary: 131 mg/dL — ABNORMAL HIGH (ref 70–99)
Glucose-Capillary: 144 mg/dL — ABNORMAL HIGH (ref 70–99)
Glucose-Capillary: 153 mg/dL — ABNORMAL HIGH (ref 70–99)
Glucose-Capillary: 176 mg/dL — ABNORMAL HIGH (ref 70–99)
Glucose-Capillary: 187 mg/dL — ABNORMAL HIGH (ref 70–99)
Glucose-Capillary: 196 mg/dL — ABNORMAL HIGH (ref 70–99)

## 2019-10-03 LAB — APTT: aPTT: 97 seconds — ABNORMAL HIGH (ref 24–36)

## 2019-10-03 MED ORDER — CLONAZEPAM 1 MG PO TABS
1.0000 mg | ORAL_TABLET | Freq: Two times a day (BID) | ORAL | Status: DC
  Administered 2019-10-03 – 2019-10-04 (×3): 1 mg
  Filled 2019-10-03 (×3): qty 1

## 2019-10-03 MED ORDER — HYDROMORPHONE BOLUS VIA INFUSION
1.0000 mg | INTRAVENOUS | Status: DC | PRN
  Administered 2019-10-03 – 2019-10-13 (×12): 1 mg via INTRAVENOUS
  Filled 2019-10-03: qty 1

## 2019-10-03 MED ORDER — VITAL AF 1.2 CAL PO LIQD
1000.0000 mL | ORAL | Status: DC
  Administered 2019-10-03 – 2019-10-13 (×10): 1000 mL
  Filled 2019-10-03 (×5): qty 1000

## 2019-10-03 MED ORDER — SODIUM CHLORIDE 0.9 % IV SOLN
1.0000 mg/h | INTRAVENOUS | Status: DC
  Administered 2019-10-03: 10:00:00 1 mg/h via INTRAVENOUS
  Administered 2019-10-04: 4 mg/h via INTRAVENOUS
  Administered 2019-10-04 – 2019-10-06 (×3): 2 mg/h via INTRAVENOUS
  Administered 2019-10-07 – 2019-10-13 (×11): 4 mg/h via INTRAVENOUS
  Administered 2019-10-13: 8 mg/h via INTRAVENOUS
  Filled 2019-10-03 (×24): qty 5

## 2019-10-03 MED ORDER — FUROSEMIDE 10 MG/ML IJ SOLN
60.0000 mg | Freq: Four times a day (QID) | INTRAMUSCULAR | Status: AC
  Administered 2019-10-03 (×2): 60 mg via INTRAVENOUS
  Filled 2019-10-03 (×2): qty 6

## 2019-10-03 MED ORDER — HEPARIN (PORCINE) 25000 UT/250ML-% IV SOLN
2100.0000 [IU]/h | INTRAVENOUS | Status: DC
  Administered 2019-10-03: 08:00:00 2100 [IU]/h via INTRAVENOUS

## 2019-10-03 MED ORDER — OXYCODONE HCL 5 MG PO TABS
5.0000 mg | ORAL_TABLET | Freq: Four times a day (QID) | ORAL | Status: DC
  Administered 2019-10-03 – 2019-10-04 (×4): 5 mg
  Filled 2019-10-03 (×4): qty 1

## 2019-10-03 MED ORDER — HEPARIN (PORCINE) 25000 UT/250ML-% IV SOLN
1750.0000 [IU]/h | INTRAVENOUS | Status: DC
  Administered 2019-10-03: 1750 [IU]/h via INTRAVENOUS
  Filled 2019-10-03: qty 250

## 2019-10-03 NOTE — Progress Notes (Signed)
Debbora Presto RN wasted Fentanyl down sink with Hardie Pulley.

## 2019-10-03 NOTE — Progress Notes (Signed)
Nutrition Follow-up  DOCUMENTATION CODES:   Obesity unspecified  INTERVENTION:    Vital AF 1.2 at 90 ml/h (2160 ml per day)   D/C Pro-stat   Provides 2592 kcal, 162 gm protein, 1752 ml free water daily   Free water flushes 150 ml every 4 hours for 2652 ml total free water intake per day.  NUTRITION DIAGNOSIS:   Increased nutrient needs related to acute illness, catabolic illness(COVID 19) as evidenced by estimated needs.  Ongoing  GOAL:   Patient will meet greater than or equal to 90% of their needs   Met with TF  MONITOR:   TF tolerance, Vent status, Labs  REASON FOR ASSESSMENT:   Ventilator, Consult Enteral/tube feeding initiation and management  ASSESSMENT:   64 yo male admitted 2/12 with worsening dyspnea and hypoxemia. Required intubation on 2/16. PMH includes COVID-19 PNA & PE in January 2021, obesity, HTN, arthritis.   Cortrak placed 2/17, tip in stomach.  Receiving Vital AF 1.2 at 65 with Pro-stat 30 ml TID. Free water flushes 150 ml every 4 hours.  Patient remains intubated on ventilator support MV: 14.8 L/min Temp (24hrs), Avg:98.2 F (36.8 C), Min:97.3 F (36.3 C), Max:99.1 F (37.3 C)   Labs reviewed.  CBG's: [phone]  Medications reviewed and include Novolog, vitamin C, lantus, novolog, zinc.   I/O +20.5 L since admission Weight 139.2 kg today; 117.9 kg on admission  Diet Order:   Diet Order            Diet NPO time specified  Diet effective now              EDUCATION NEEDS:   Not appropriate for education at this time  Skin:  Skin Assessment: Skin Integrity Issues: Skin Integrity Issues:: DTI DTI: toe  Last BM:  2/20  Height:   Ht Readings from Last 1 Encounters:  09/30/19 '5\' 11"'  (1.803 m)    Weight:   Wt Readings from Last 1 Encounters:  10/03/19 (!) 139.2 kg   Admission weight 117.9 kg  Ideal Body Weight:  78.2 kg  Estimated Nutritional Needs:   Kcal:  2500-3000  Protein:  >/= 156  gm  Fluid:  >/= 2.5 L    Molli Barrows, RD, LDN, CNSC Please refer to Amion for contact information.

## 2019-10-03 NOTE — Plan of Care (Signed)
  Problem: Education: Goal: Knowledge of risk factors and measures for prevention of condition will improve Outcome: Progressing   Problem: Coping: Goal: Psychosocial and spiritual needs will be supported Outcome: Progressing   Problem: Respiratory: Goal: Will maintain a patent airway Outcome: Progressing Goal: Complications related to the disease process, condition or treatment will be avoided or minimized Outcome: Progressing   Problem: Education: Goal: Knowledge of General Education information will improve Description: Including pain rating scale, medication(s)/side effects and non-pharmacologic comfort measures Outcome: Progressing   Problem: Health Behavior/Discharge Planning: Goal: Ability to manage health-related needs will improve Outcome: Progressing   Problem: Clinical Measurements: Goal: Ability to maintain clinical measurements within normal limits will improve Outcome: Progressing Goal: Will remain free from infection Outcome: Progressing Goal: Diagnostic test results will improve Outcome: Progressing Goal: Respiratory complications will improve Outcome: Progressing Goal: Cardiovascular complication will be avoided Outcome: Progressing   Problem: Activity: Goal: Risk for activity intolerance will decrease Outcome: Progressing   Problem: Nutrition: Goal: Adequate nutrition will be maintained Outcome: Progressing   Problem: Coping: Goal: Level of anxiety will decrease Outcome: Progressing   Problem: Elimination: Goal: Will not experience complications related to bowel motility Outcome: Progressing Goal: Will not experience complications related to urinary retention Outcome: Progressing   Problem: Pain Managment: Goal: General experience of comfort will improve Outcome: Progressing   Problem: Safety: Goal: Ability to remain free from injury will improve Outcome: Progressing   Problem: Skin Integrity: Goal: Risk for impaired skin integrity will  decrease Outcome: Progressing   Problem: Education: Goal: Knowledge of disease or condition will improve Outcome: Progressing Goal: Understanding of medication regimen will improve Outcome: Progressing Goal: Individualized Educational Video(s) Outcome: Progressing   Problem: Activity: Goal: Ability to tolerate increased activity will improve Outcome: Progressing   Problem: Cardiac: Goal: Ability to achieve and maintain adequate cardiopulmonary perfusion will improve Outcome: Progressing

## 2019-10-03 NOTE — Progress Notes (Signed)
ANTICOAGULATION CONSULT NOTE - Follow Up Consult  Pharmacy Consult for Heparin IV Indication: Hx PE (apixaban PTA), Afib  No Known Allergies  Patient Measurements: Height: 5\' 11"  (180.3 cm) Weight: (!) 306 lb 14.1 oz (139.2 kg) IBW/kg (Calculated) : 75.3 Heparin Dosing Weight: 108 kg  Vital Signs: Temp: 99.1 F (37.3 C) (02/23 1630) Temp Source: Esophageal (02/23 1600) BP: 99/66 (02/23 1630) Pulse Rate: 63 (02/23 1630)  Labs: Recent Labs    10/01/19 1510 10/01/19 1510 10/01/19 1748 10/01/19 2340 10/02/19 0430 10/02/19 0430 10/02/19 1427 10/02/19 1427 10/02/19 2240 10/03/19 0613 10/03/19 1438  HGB 10.7*   < > 10.5*  --  10.6*  --   --   --   --  10.4*  --   HCT 34.3*   < > 31.0*  --  33.3*  --   --   --   --  32.9*  --   PLT 162  --   --   --  154  --   --   --   --  164  --   APTT  --   --   --    < > 31   < > 30  --  31 97*  --   LABPROT  --   --   --   --  15.4*  --   --   --   --   --   --   INR  --   --   --   --  1.2  --   --   --   --   --   --   HEPARINUNFRC  --   --   --   --  0.61   < > 0.41   < > 0.32 0.87* 1.00*  CREATININE 1.37*  --   --   --  1.23  --   --   --   --  1.02  --    < > = values in this interval not displayed.    Estimated Creatinine Clearance: 108.5 mL/min (by C-G formula based on SCr of 1.02 mg/dL).   Medications: Infusions:  . amiodarone 30 mg/hr (10/03/19 1600)  . feeding supplement (VITAL AF 1.2 CAL)    . heparin 2,100 Units/hr (10/03/19 1600)  . HYDROmorphone 3 mg/hr (10/03/19 1600)  . midazolam 8 mg/hr (10/03/19 1600)  . norepinephrine (LEVOPHED) Adult infusion Stopped (10/03/19 0013)    Assessment: 64 YOM with COVID initially continued on home apixaban for a h/o PE. Pharmacy was consulted to transition to IV heparin.  Heparin was held on 2/21 for rectal bleeding, but resolved on 2/21 and heparin restarted.  Today, 10/03/2019: Heparin level increased to 0.87, supratherapeutic, after IV line "kink" was removed and line was  changed and heparin rate was increased to 2400 units/hr.  Suspect that previous low levels were due to problems with infusion. APTT 97 is increased to therapeutic, correlating with increase in heparin level.  Will use only heparin levels for dosing changes going forward. CBC:  Hgb decreased from 12-13 range down to 10's on 2/21, but now remains low/stable today at 10.4.  Plt stable at 164k No bleeding or complications reported by RN Roderic Ovens.  PM update:  - HL is 1.00 elevated  - per RN no line interruptions or bleeding issues   Goal of Therapy:  Heparin level 0.3-0.7 units/ml aPTT 66-102 seconds Monitor platelets by anticoagulation protocol: Yes   Plan:  Decrease to heparin IV infusion at  1750 units/hr Heparin level 6 hours after rate change Daily heparin level and CBC    Adalberto Cole, PharmD, BCPS 10/03/2019 5:16 PM

## 2019-10-03 NOTE — Progress Notes (Signed)
eLink Physician-Brief Progress Note Patient Name: Roy Lee DOB: 01/25/1956 MRN: [id number]   Date of Service  10/03/2019  HPI/Events of Note  Request for BMP and CBC in AM.   eICU Interventions  Will order: 1. CBC with platelets and BMP at 5 AM.     Intervention Category Major Interventions: Other:  Myrl Bynum Dennard Nip 10/03/2019, 2:09 AM

## 2019-10-03 NOTE — Progress Notes (Signed)
NAME:  Roy Lee, MRN:  [id number], DOB:  1956-05-08, LOS: 27 ADMISSION DATE:  09/17/2019, CONSULTATION DATE:  2/16 REFERRING MD:  Aileen Fass, CHIEF COMPLAINT:  Dyspnea   Brief History   64y/o male treated for COVID 19 pneumonia and PE in January, treated with actemra, steroids, antivirals and dc'd home on 3-5 L O2 and anticoagulation.  Returned to the ED 2/12 with worsening dyspnea and hypoxemia.  Moved to the ICU on 2/16 in the setting of worsening hypoxemia and atrial fibrillation with RVR.  Past Medical History  Obesity Hypertension arthitis  Significant Hospital Events   1/24 original admission, treated with actemra, steroids, antivirals 1/30 d/c home on 3-5 L O2 2/12 return to ED, admission, treated with ceftriaxone/azithro 2/16 move to ICU, intubation, bronchoscopy, prone, afib with rvr started dilt 2/18 fever, started antibiotics 2/21 early AM bright red blood per rectum, vent dyssynchrony Plateau pressure 34, afib with rvr started on amiodarone, nearly off vasopressors, held prone because oxygeantion better  Consults:  PCCM  Procedures:  2/16 ETT >  2/16 L IJ CVL >   Significant Diagnostic Tests:  2/12 CT angiogram chest > no PE, bilateral infiltrates, nodular, progressed compared to 2/6 film 2/16 CT chest> significant progression of bilateral ground glass and consolidation compared to 2/12 film 2/17 Echo> RV dilation and RV pressure overload, LVEF normal   Micro Data:  2/12 blood > negative 2/14 resp culture > GPC in pair, cluster, GPR, GNR 2/16 BAL > ngtd 2/16 BAL fungal > ngtd 2/16 BAL AFB > ngtd  2/18 blood >  2/18 resp culture >   Antimicrobials:  2/12  Ceftriaxone > 2/16  2/12  Azithro > 2/16   2/18 cefepime >  2/18 vanc >   Interim history/subjective:    More dyssynchrony yesterday Some hypoxemia Moving around some Kidney function  Change from fent to dilaudid   Objective   Blood pressure 99/65, pulse 60, temperature (!) 97.5 F  (36.4 C), resp. rate (!) 32, height _0  (1.803 m), weight (!) 139.2 kg, SpO2 90 %. CVP:  [14 mmHg-21 mmHg] 18 mmHg  Vent Mode: PCV FiO2 (%):  [60 %-70 %] 60 % Set Rate:  [28 bmp-35 bmp] 28 bmp PEEP:  [12 cmH20] 12 cmH20 Plateau Pressure:  [34 cmH20-40 cmH20] 38 cmH20   Intake/Output Summary (Last 24 hours) at 10/03/2019 0748 Last data filed at 10/03/2019 0700 Gross per 24 hour  Intake 5081.73 ml  Output 4370 ml  Net 711.73 ml   Filed Weights   10/01/19 0227 10/02/19 0205 10/03/19 0323  Weight: (!) 141.7 kg (!) 139.8 kg (!) 139.2 kg    Examination:  General:  In bed on vent HENT: NCAT ETT in place PULM: CTA B, vent supported breathing CV: RRR, no mgr GI: BS+, soft, nontender MSK: normal bulk and tone Neuro: sedated on vent   2/23 CXR personally reviewed, severe bilateral airspace disease, ett in place  Resolved Hospital Problem list   Epistaxis 2/19 > none since  Lower GI bleed on 2/21, resolved, hemorrhoid Circulatory shock/Septic shock: resolved  Assessment & Plan:  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, late phase disease Plateau 34 on 2/21, acceptable Worsening vent dyssynchrony again on 2/23 Continue mechanical ventilation per ARDS protocol Target TVol 6-8cc/kgIBW Target Plateau Pressure < 30cm H20 Target driving pressure less than 15 cm of water Target PaO2 55-65: titrate PEEP/FiO2 per protocol As long as PaO2 to FiO2 ratio is less than 1:150 position in prone position for 16 hours  a day Check CVP daily if CVL in place Target CVP less than 4, diurese as necessary Ventilator associated pneumonia prevention protocol 2/23 due to ventilator dyssynchrony will change sedation from fentanyl to dilaudid, consider tracheostomy   Atrial fibrillation with RVR > required amiodarone, doing well on that Tele Consider converting amiodarone to oral   PE Heparin infusion to continue  Monitor for bleeding Transfuse PRBC for Hgb < 7 gm/dL  Oliguric renal failure, AKI >  improving Azotemia Volume overloaded Monitor BMET and UOP Replace electrolytes as needed Lasix again today  RV overload due to severe ARDS Tele stop stress dose steroids  LFT abnormalities today> AST better, ALT down slightly compared to yesterday morning, picture most consistent with shock liver Repeat LFT in AM  Hyperglycemia with prediabetes Continue long acting, tube feeding coverage and SSI  Need for sedation for mechanical ventilation Some vent dyssynchrony PAD protocol RASS target -3 Change fentanyl to dilaudid Continue versed infusion   Best practice:  Diet: tube feeding Pain/Anxiety/Delirium protocol (if indicated): yes, RASS target -5, as above VAP protocol (if indicated): yes DVT prophylaxis: eliquis GI prophylaxis: Pantoprazole for stress ulcer prophylaxis Glucose control: SSI Mobility: bed rest Code Status: full Family Communication: daughters updated by phone on 2/23 Disposition: remain in ICU  Labs   CBC: Recent Labs  Lab 09/27/19 0500 09/27/19 1110 09/30/19 0400 09/30/19 1621 10/01/19 0300 10/01/19 1510 10/01/19 1748 10/02/19 0430 10/03/19 0613  WBC 7.5   < > 13.1*  --  27.0* 14.1*  --  12.7* 12.7*  NEUTROABS 5.9  --   --   --  18.0*  --   --  10.1* 10.0*  HGB 13.0   < > 13.1   < > 13.0 10.7* 10.5* 10.6* 10.4*  HCT 39.4   < > 42.2   < > 43.2 34.3* 31.0* 33.3* 32.9*  MCV 91.2   < > 95.9  --  99.5 95.8  --  94.9 95.1  PLT 108*   < > 199  --  240 162  --  154 164   < > = values in this interval not displayed.    Basic Metabolic Panel: Recent Labs  Lab 09/26/19 1730 09/27/19 0103 09/27/19 0500 09/27/19 1110 09/27/19 1750 09/27/19 1940 09/30/19 0400 09/30/19 1621 10/01/19 0300 10/01/19 1510 10/01/19 1748 10/02/19 0430 10/03/19 0613  NA  --    < > 138   < >  --    < > 137   < > 143 139 136 139 139  K  --    < > 4.2   < >  --    < > 5.5*   < > 5.5* 4.6 4.0 4.3 4.1  CL  --   --  94*  --   --    < > 93*  --  95* 96*  --  96* 95*  CO2   --   --  36*  --   --    < > 36*  --  39* 35*  --  35* 36*  GLUCOSE  --   --  216*  --   --    < > 233*  --  315* 214*  --  191* 179*  BUN  --   --  31*  --   --    < > 56*  --  72* 77*  --  76* 69*  CREATININE  --   --  0.79  --   --    < >  0.94  --  1.28* 1.37*  --  1.23 1.02  CALCIUM  --   --  7.5*  --   --    < > 7.8*  --  8.1* 7.7*  --  8.1* 7.9*  MG 2.1  --  2.1  --  2.5*  --   --   --   --   --   --  2.8*  --   PHOS 5.2*  --  3.0  --  5.0*  --   --   --   --   --   --  2.3*  --    < > = values in this interval not displayed.   GFR: Estimated Creatinine Clearance: 108.5 mL/min (by C-G formula based on SCr of 1.02 mg/dL). Recent Labs  Lab 09/28/19 0445 09/29/19 0320 09/29/19 0801 09/30/19 0400 09/30/19 0400 10/01/19 0300 10/01/19 1510 10/02/19 0430 10/03/19 0613  PROCALCITON 3.80 2.21  --  1.34  --   --   --   --   --   WBC 7.2  --    < > 13.1*   < > 27.0* 14.1* 12.7* 12.7*   < > = values in this interval not displayed.    Liver Function Tests: Recent Labs  Lab 09/29/19 0801 09/30/19 0400 09/30/19 1645 10/01/19 0300 10/02/19 0430  AST 69* 194* 103* 102* 89*  ALT 92* 313* 245* 277* 255*  ALKPHOS 96 115 103 126 91  BILITOT 0.4 0.8 0.6 0.8 0.6  PROT 4.8* 5.8* 5.1* 6.3* 5.5*  ALBUMIN 2.2* 2.3* 2.2* 2.8* 2.3*   No results for input(s): LIPASE, AMYLASE in the last 168 hours. No results for input(s): AMMONIA in the last 168 hours.  ABG    Component Value Date/Time   PHART 7.379 10/01/2019 1748   PCO2ART 63.4 (H) 10/01/2019 1748   PO2ART 149.0 (H) 10/01/2019 1748   HCO3 37.6 (H) 10/01/2019 1748   TCO2 40 (H) 10/01/2019 1748   ACIDBASEDEF 7.0 (H) 09/26/2019 1136   O2SAT 99.0 10/01/2019 1748     Coagulation Profile: Recent Labs  Lab 10/02/19 0430  INR 1.2    Cardiac Enzymes: No results for input(s): CKTOTAL, CKMB, CKMBINDEX, TROPONINI in the last 168 hours.  HbA1C: Hgb A1c MFr Bld  Date/Time Value Ref Range Status  09/06/2019 11:40 AM 6.3 (H) 4.8 -  5.6 % Final    Comment:    (NOTE) Pre diabetes:          5.7%-6.4% Diabetes:              >6.4% Glycemic control for   <7.0% adults with diabetes     CBG: Recent Labs  Lab 10/02/19 1230 10/02/19 1617 10/02/19 1951 10/02/19 2349 10/03/19 0326  GLUCAP 167* 112* 149* 176* 196*     Critical care time: 35 minutes     Roselie Awkward, MD [facility] Pager: [phone] Cell: [phone] If no response, call [phone]

## 2019-10-03 NOTE — Progress Notes (Signed)
ANTICOAGULATION CONSULT NOTE - Follow Up Consult  Pharmacy Consult for Heparin IV Indication: Hx PE (apixaban PTA), Afib  No Known Allergies  Patient Measurements: Height: 5\' 11"  (180.3 cm) Weight: (!) 306 lb 14.1 oz (139.2 kg) IBW/kg (Calculated) : 75.3 Heparin Dosing Weight: 108 kg  Vital Signs: Temp: 97.5 F (36.4 C) (02/23 0700) BP: 101/68 (02/23 0700) Pulse Rate: 60 (02/23 0700)  Labs: Recent Labs    10/01/19 0300 10/01/19 0300 10/01/19 1510 10/01/19 1510 10/01/19 1748 10/01/19 2340 10/02/19 0430 10/02/19 1427 10/02/19 2240 10/03/19 0613  HGB 13.0   < > 10.7*   < > 10.5*  --  10.6*  --   --  10.4*  HCT 43.2   < > 34.3*   < > 31.0*  --  33.3*  --   --  32.9*  PLT 240   < > 162  --   --   --  154  --   --  164  APTT 96*  --   --   --   --    < > 31 30 31   --   LABPROT  --   --   --   --   --   --  15.4*  --   --   --   INR  --   --   --   --   --   --  1.2  --   --   --   HEPARINUNFRC 1.72*   < >  --   --   --   --  0.61 0.41 0.32  --   CREATININE 1.28*  --  1.37*  --   --   --  1.23  --   --   --    < > = values in this interval not displayed.    Estimated Creatinine Clearance: 90 mL/min (by C-G formula based on SCr of 1.23 mg/dL).   Medications: Infusions:  . amiodarone 30 mg/hr (10/03/19 0700)  . ceFEPime (MAXIPIME) IV Stopped (10/03/19 0545)  . feeding supplement (VITAL AF 1.2 CAL) 1,000 mL (10/03/19 0420)  . fentaNYL infusion INTRAVENOUS 200 mcg/hr (10/03/19 0700)  . heparin 2,400 Units/hr (10/03/19 0700)  . midazolam 7 mg/hr (10/03/19 0700)  . norepinephrine (LEVOPHED) Adult infusion Stopped (10/03/19 0013)  . phenylephrine (NEO-SYNEPHRINE) Adult infusion Stopped (09/30/19 1803)  . vancomycin Stopped (10/03/19 0257)  . vasopressin (PITRESSIN) infusion - *FOR SHOCK* Stopped (10/01/19 10/05/19)    Assessment: 36 YOM with COVID initially continued on home apixaban for a h/o PE. Pharmacy was consulted to transition to IV heparin.  Heparin was held on 2/21  for rectal bleeding, but resolved on 2/21 and heparin restarted.  Today, 10/03/2019: Heparin level increased to 0.87, supratherapeutic, after IV line "kink" was removed and line was changed and heparin rate was increased to 2400 units/hr.  Suspect that previous low levels were due to problems with infusion. APTT 97 is increased to therapeutic, correlating with increase in heparin level.  Will use only heparin levels for dosing changes going forward. CBC:  Hgb decreased from 12-13 range down to 10's on 2/21, but now remains low/stable today at 10.4.  Plt stable at 164k No bleeding or complications reported by RN 10/05/2019.  Goal of Therapy:  Heparin level 0.3-0.7 units/ml aPTT 66-102 seconds Monitor platelets by anticoagulation protocol: Yes   Plan:  Decrease to heparin IV infusion at 2100 units/hr Heparin level 6 hours after rate change Daily heparin level and CBC  Gretta Arab PharmD, BCPS Clinical pharmacist phone 7am- 5pm: [phone] 10/03/2019 7:10 AM

## 2019-10-04 ENCOUNTER — Inpatient Hospital Stay (HOSPITAL_COMMUNITY): Payer: 59

## 2019-10-04 ENCOUNTER — Inpatient Hospital Stay: Payer: Self-pay

## 2019-10-04 LAB — POCT I-STAT 7, (LYTES, BLD GAS, ICA,H+H)
Acid-Base Excess: 14 mmol/L — ABNORMAL HIGH (ref 0.0–2.0)
Bicarbonate: 43 mmol/L — ABNORMAL HIGH (ref 20.0–28.0)
Calcium, Ion: 1.15 mmol/L (ref 1.15–1.40)
HCT: 35 % — ABNORMAL LOW (ref 39.0–52.0)
Hemoglobin: 11.9 g/dL — ABNORMAL LOW (ref 13.0–17.0)
O2 Saturation: 89 %
Patient temperature: 37.2
Potassium: 4.2 mmol/L (ref 3.5–5.1)
Sodium: 140 mmol/L (ref 135–145)
TCO2: 45 mmol/L — ABNORMAL HIGH (ref 22–32)
pCO2 arterial: 80.8 mmHg (ref 32.0–48.0)
pH, Arterial: 7.335 — ABNORMAL LOW (ref 7.350–7.450)
pO2, Arterial: 65 mmHg — ABNORMAL LOW (ref 83.0–108.0)

## 2019-10-04 LAB — GLUCOSE, CAPILLARY
Glucose-Capillary: 100 mg/dL — ABNORMAL HIGH (ref 70–99)
Glucose-Capillary: 113 mg/dL — ABNORMAL HIGH (ref 70–99)
Glucose-Capillary: 119 mg/dL — ABNORMAL HIGH (ref 70–99)
Glucose-Capillary: 128 mg/dL — ABNORMAL HIGH (ref 70–99)
Glucose-Capillary: 94 mg/dL (ref 70–99)
Glucose-Capillary: 96 mg/dL (ref 70–99)

## 2019-10-04 LAB — HEPATIC FUNCTION PANEL
ALT: 147 U/L — ABNORMAL HIGH (ref 0–44)
AST: 40 U/L (ref 15–41)
Albumin: 2.3 g/dL — ABNORMAL LOW (ref 3.5–5.0)
Alkaline Phosphatase: 78 U/L (ref 38–126)
Bilirubin, Direct: 0.1 mg/dL (ref 0.0–0.2)
Indirect Bilirubin: 0.6 mg/dL (ref 0.3–0.9)
Total Bilirubin: 0.7 mg/dL (ref 0.3–1.2)
Total Protein: 5.4 g/dL — ABNORMAL LOW (ref 6.5–8.1)

## 2019-10-04 LAB — CBC WITH DIFFERENTIAL/PLATELET
Abs Immature Granulocytes: 0.69 10*3/uL — ABNORMAL HIGH (ref 0.00–0.07)
Basophils Absolute: 0.1 10*3/uL (ref 0.0–0.1)
Basophils Relative: 0 %
Eosinophils Absolute: 0.1 10*3/uL (ref 0.0–0.5)
Eosinophils Relative: 1 %
HCT: 32.4 % — ABNORMAL LOW (ref 39.0–52.0)
Hemoglobin: 10.1 g/dL — ABNORMAL LOW (ref 13.0–17.0)
Immature Granulocytes: 5 %
Lymphocytes Relative: 12 %
Lymphs Abs: 1.7 10*3/uL (ref 0.7–4.0)
MCH: 30.3 pg (ref 26.0–34.0)
MCHC: 31.2 g/dL (ref 30.0–36.0)
MCV: 97.3 fL (ref 80.0–100.0)
Monocytes Absolute: 0.6 10*3/uL (ref 0.1–1.0)
Monocytes Relative: 4 %
Neutro Abs: 10.5 10*3/uL — ABNORMAL HIGH (ref 1.7–7.7)
Neutrophils Relative %: 78 %
Platelets: 151 10*3/uL (ref 150–400)
RBC: 3.33 MIL/uL — ABNORMAL LOW (ref 4.22–5.81)
RDW: 17.2 % — ABNORMAL HIGH (ref 11.5–15.5)
WBC: 13.6 10*3/uL — ABNORMAL HIGH (ref 4.0–10.5)
nRBC: 0.7 % — ABNORMAL HIGH (ref 0.0–0.2)

## 2019-10-04 LAB — BASIC METABOLIC PANEL
Anion gap: 8 (ref 5–15)
BUN: 65 mg/dL — ABNORMAL HIGH (ref 8–23)
CO2: 39 mmol/L — ABNORMAL HIGH (ref 22–32)
Calcium: 7.8 mg/dL — ABNORMAL LOW (ref 8.9–10.3)
Chloride: 95 mmol/L — ABNORMAL LOW (ref 98–111)
Creatinine, Ser: 1.01 mg/dL (ref 0.61–1.24)
GFR calc Af Amer: >60 mL/min (ref >60)
GFR calc non Af Amer: >60 mL/min (ref >60)
Glucose, Bld: 130 mg/dL — ABNORMAL HIGH (ref 70–99)
Potassium: 3.4 mmol/L — ABNORMAL LOW (ref 3.5–5.1)
Sodium: 142 mmol/L (ref 135–145)

## 2019-10-04 LAB — APTT: aPTT: 136 seconds — ABNORMAL HIGH (ref 24–36)

## 2019-10-04 LAB — HEPARIN LEVEL (UNFRACTIONATED)
Heparin Unfractionated: 1.02 IU/mL — ABNORMAL HIGH (ref 0.30–0.70)
Heparin Unfractionated: 0.65 IU/mL (ref 0.30–0.70)
Heparin Unfractionated: >2.2 IU/mL — ABNORMAL HIGH (ref 0.30–0.70)
Heparin Unfractionated: 0.47 IU/mL (ref 0.30–0.70)

## 2019-10-04 MED ORDER — SODIUM CHLORIDE 0.9% FLUSH: 10.0000 mL | INTRAVENOUS | Status: DC | PRN

## 2019-10-04 MED ORDER — POTASSIUM CHLORIDE 20 MEQ/15ML (10%) PO SOLN
40.0000 meq | Freq: Once | ORAL | Status: AC
  Administered 2019-10-04: 40 meq via ORAL
  Filled 2019-10-04: qty 30

## 2019-10-04 MED ORDER — SODIUM CHLORIDE 0.9% FLUSH
10.0000 mL | Freq: Two times a day (BID) | INTRAVENOUS | Status: DC
  Administered 2019-10-04 – 2019-10-10 (×12): 10 mL
  Administered 2019-10-10: 20 mL
  Administered 2019-10-11 – 2019-10-12 (×3): 10 mL

## 2019-10-04 MED ORDER — CLONAZEPAM 0.5 MG PO TABS
0.5000 mg | ORAL_TABLET | Freq: Two times a day (BID) | ORAL | Status: DC
  Administered 2019-10-04 – 2019-10-05 (×2): 0.5 mg
  Filled 2019-10-04 (×2): qty 1

## 2019-10-04 MED ORDER — AMIODARONE HCL 200 MG PO TABS: 200.0000 mg | ORAL_TABLET | Freq: Every day | ORAL | Status: DC

## 2019-10-04 MED ORDER — AMIODARONE HCL 200 MG PO TABS
400.0000 mg | ORAL_TABLET | Freq: Every day | ORAL | Status: DC
  Administered 2019-10-04 – 2019-10-05 (×2): 400 mg via ORAL
  Filled 2019-10-04 (×2): qty 2

## 2019-10-04 MED ORDER — OXYCODONE HCL 5 MG PO TABS
2.5000 mg | ORAL_TABLET | Freq: Four times a day (QID) | ORAL | Status: DC
  Administered 2019-10-04 – 2019-10-05 (×4): 2.5 mg
  Filled 2019-10-04 (×4): qty 1

## 2019-10-04 MED ORDER — HEPARIN (PORCINE) 25000 UT/250ML-% IV SOLN
1500.0000 [IU]/h | INTRAVENOUS | Status: DC
  Administered 2019-10-04 – 2019-10-05 (×3): 1500 [IU]/h via INTRAVENOUS
  Filled 2019-10-04 (×2): qty 250

## 2019-10-04 MED ORDER — FUROSEMIDE 10 MG/ML IJ SOLN
60.0000 mg | Freq: Four times a day (QID) | INTRAMUSCULAR | Status: AC
  Administered 2019-10-04 (×2): 60 mg via INTRAVENOUS
  Filled 2019-10-04 (×2): qty 6

## 2019-10-04 MED ORDER — POLYETHYLENE GLYCOL 3350 17 G PO PACK
17.0000 g | PACK | Freq: Every day | ORAL | Status: DC
  Administered 2019-10-04 – 2019-10-05 (×2): 17 g via ORAL
  Filled 2019-10-04: qty 1

## 2019-10-04 NOTE — Progress Notes (Signed)
Peripherally Inserted Central Catheter/Midline Placement  The IV Nurse has discussed with the patient and/or persons authorized to consent for the patient, the purpose of this procedure and the potential benefits and risks involved with this procedure.  The benefits include less needle sticks, lab draws from the catheter, and the patient may be discharged home with the catheter. Risks include, but not limited to, infection, bleeding, blood clot (thrombus formation), and puncture of an artery; nerve damage and irregular heartbeat and possibility to perform a PICC exchange if needed/ordered by physician.  Alternatives to this procedure were also discussed.  Bard Power PICC patient education guide, fact sheet on infection prevention and patient information card has been provided to patient /or left at bedside.    PICC/Midline Placement Documentation  PICC Double Lumen 10/04/19 PICC Right Cephalic 46 cm 1 cm (Active)  Indication for Insertion or Continuance of Line Vasoactive infusions 10/04/19 1426  Exposed Catheter (cm) 1 cm 10/04/19 1426  Site Assessment Clean;Dry;Intact 10/04/19 1426  Lumen #1 Status Flushed;Blood return noted 10/04/19 1426  Lumen #2 Status Flushed;Blood return noted 10/04/19 1426  Dressing Type Transparent 10/04/19 1426  Dressing Status Clean;Dry;Intact;Antimicrobial disc in place;Other (Comment) 10/04/19 1426  Dressing Intervention New dressing 10/04/19 1426  Dressing Change Due 10/11/19 10/04/19 1426    Telepone consen signed by wife as POA   Maximino Greenland 10/04/2019, 2:27 PM

## 2019-10-04 NOTE — Progress Notes (Signed)
ANTICOAGULATION CONSULT NOTE - Follow Up Consult  Pharmacy Consult for Heparin IV Indication: Hx PE (apixaban PTA), Afib  No Known Allergies  Patient Measurements: Height: 5\' 11"  (180.3 cm) Weight: (!) 315 lb 7.7 oz (143.1 kg) IBW/kg (Calculated) : 75.3 Heparin Dosing Weight: 108 kg  Vital Signs: Temp: 98.1 F (36.7 C) (02/24 0900) Temp Source: Esophageal (02/24 0400) BP: 98/70 (02/24 0938) Pulse Rate: 57 (02/24 0938)  Labs: Recent Labs    10/02/19 0430 10/02/19 1427 10/02/19 2240 10/02/19 2240 10/03/19 0613 10/03/19 1438 10/04/19 0200  HGB 10.6*  --   --   --  10.4*  --  10.1*  HCT 33.3*  --   --   --  32.9*  --  32.4*  PLT 154  --   --   --  164  --  151  APTT 31   < > 31  --  97*  --  136*  LABPROT 15.4*  --   --   --   --   --   --   INR 1.2  --   --   --   --   --   --   HEPARINUNFRC 0.61   < > 0.32   < > 0.87* 1.00* 1.02*  CREATININE 1.23  --   --   --  1.02  --  1.01   < > = values in this interval not displayed.    Estimated Creatinine Clearance: 111.2 mL/min (by C-G formula based on SCr of 1.01 mg/dL).   Medications: Infusions:  . amiodarone 30 mg/hr (10/04/19 0102)  . feeding supplement (VITAL AF 1.2 CAL) 90 mL/hr at 10/03/19 2117  . heparin 1,500 Units/hr (10/04/19 0411)  . HYDROmorphone 3 mg/hr (10/04/19 0200)  . midazolam 7 mg/hr (10/04/19 0253)  . norepinephrine (LEVOPHED) Adult infusion 2 mcg/min (10/04/19 0916)    Assessment: 70 YOM with COVID initially continued on home apixaban for a h/o PE. Pharmacy was consulted to transition to IV heparin.  Heparin was held on 2/21 for rectal bleeding, but resolved on 2/21 and heparin restarted.  Today, 10/04/2019: Heparin level decreased to 0.65, therapeutic on heparin at 1500 unit/hr. - Previous supratherapeutic levels obtained after IV line "kink" was removed and line was changed.  Suspect that previous low levels were due to problems with infusion. CBC:  Hgb decreased from 12-13 range down to 10's on  2/21, but now remains low/stable today at 10.1.  Plt stable at 151k No bleeding or complications reported by 3/21 RN.  Goal of Therapy:  Heparin level 0.3-0.7 units/ml aPTT 66-102 seconds Monitor platelets by anticoagulation protocol: Yes   Plan:  Continue heparin IV infusion at 1500 units/hr Confirmatory heparin level in 6 hours  Daily heparin level and CBC   Arbutus Ped PharmD, BCPS Clinical pharmacist phone 7am- 5pm: [phone] 10/04/2019 11:00 AM

## 2019-10-04 NOTE — Progress Notes (Signed)
NAME:  Roy Lee, MRN:  [id number], DOB:  11/23/1955, LOS: 12 ADMISSION DATE:  10/02/2019, CONSULTATION DATE:  2/16 REFERRING MD:  Aileen Fass, CHIEF COMPLAINT:  Dyspnea   Brief History   64y/o male treated for COVID 19 pneumonia and PE in January, treated with actemra, steroids, antivirals and dc'd home on 3-5 L O2 and anticoagulation.  Returned to the ED 2/12 with worsening dyspnea and hypoxemia.  Moved to the ICU on 2/16 in the setting of worsening hypoxemia and atrial fibrillation with RVR.  Past Medical History  Obesity Hypertension arthitis  Significant Hospital Events   1/24 original admission, treated with actemra, steroids, antivirals 1/30 d/c home on 3-5 L O2 2/12 return to ED, admission, treated with ceftriaxone/azithro 2/16 move to ICU, intubation, bronchoscopy, prone, afib with rvr started dilt 2/18 fever, started antibiotics 2/21 early AM bright red blood per rectum, vent dyssynchrony Plateau pressure 34, afib with rvr started on amiodarone, nearly off vasopressors, held prone because oxygeantion better 2/23 oxygenation better  Consults:  PCCM  Procedures:  2/16 ETT >  2/16 L IJ CVL >   Significant Diagnostic Tests:  2/12 CT angiogram chest > no PE, bilateral infiltrates, nodular, progressed compared to 2/6 film 2/16 CT chest> significant progression of bilateral ground glass and consolidation compared to 2/12 film 2/17 Echo> RV dilation and RV pressure overload, LVEF normal   Micro Data:  2/12 blood > negative 2/14 resp culture > GPC in pair, cluster, GPR, GNR 2/16 BAL > ngtd 2/16 BAL fungal > ngtd 2/16 BAL AFB > ngtd  2/18 blood > neg 2/18 resp culture > neg  Antimicrobials:  2/12  Ceftriaxone > 2/16  2/12  Azithro > 2/16   2/18 cefepime >  2/18 vanc >   Interim history/subjective:   Kidney and liver function improving  Objective   Blood pressure 100/67, pulse 60, temperature 98.2 F (36.8 C), resp. rate (!) 24, height _0  (1.803 m),  weight (!) 143.1 kg, SpO2 90 %. CVP:  [7 mmHg-27 mmHg] 7 mmHg  Vent Mode: PCV FiO2 (%):  [60 %-70 %] 60 % Set Rate:  [28 bmp] 28 bmp PEEP:  [12 cmH20] 12 cmH20 Plateau Pressure:  [22 cmH20-40 cmH20] 34 cmH20   Intake/Output Summary (Last 24 hours) at 10/04/2019 1104 Last data filed at 10/04/2019 0600 Gross per 24 hour  Intake 2303.12 ml  Output 3625 ml  Net -1321.88 ml   Filed Weights   10/02/19 0205 10/03/19 0323 10/04/19 0500  Weight: (!) 139.8 kg (!) 139.2 kg (!) 143.1 kg    Examination:  General:  In bed on vent HENT: NCAT ETT in place PULM: wheezing R > L, vent supported breathing CV: RRR, no mgr GI: BS+, soft, nontender MSK: normal bulk and tone Neuro: sedated on vent  2/24 CXR personally reviewed> severe bilateral airspace disease, ETT in place  Resolved Hospital Problem list   Epistaxis 2/19 > none since  Lower GI bleed on 2/21, resolved, hemorrhoid Circulatory shock/Septic shock: resolved  Assessment & Plan:  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, late phase disease Plateau 34 on 2/21, acceptable Worsening vent dyssynchrony again on 2/23 Continue mechanical ventilation per ARDS protocol Target TVol 6-8cc/kgIBW Target Plateau Pressure < 30cm H20 Target driving pressure less than 15 cm of water Target PaO2 55-65: titrate PEEP/FiO2 per protocol As long as PaO2 to FiO2 ratio is less than 1:150 position in prone position for 16 hours a day Check CVP daily if CVL in place Target CVP  less than 4, diurese as necessary Ventilator associated pneumonia prevention protocol 2/24 continue to wean PEEP and FiO2 per protocol, lighten up sedation  Atrial fibrillation with RVR > required amiodarone, doing well on that Tele Change amiodarone to oral  PE Continue heparin, consider eliquis on 2/24 Monitor BMET and UOP Replace electrolytes as needed  Oliguric renal failure, AKI > improving Azotemia Volume overloaded Monitor BMET and UOP Replace electrolytes as  needed Lasix again today  RV overload due to severe ARDS Tele  LFT abnormalities today>continued improvement LFT PRN  Hyperglycemia with prediabetes Continue long acting, tube feeding coverage and SSI  Need for sedation for mechanical ventilation Some vent dyssynchrony PAD protocol RASS target -2 Dilaudid and versed Decrease oral sedatives by 50%  General 9/24 CXR shows L IJ CVL pulled back, will order PICC   Goals of care: family is interested in [place] ongoing aggressive management with intent for full recovery, this would include a tracheostomy. However they don't want him to go through CPR in the event of a cardiac arrest or go on dialysis.     Best practice:  Diet: tube feeding Pain/Anxiety/Delirium protocol (if indicated): yes, RASS target -5, as above VAP protocol (if indicated): yes DVT prophylaxis: eliquis GI prophylaxis: Pantoprazole for stress ulcer prophylaxis Glucose control: SSI Mobility: bed rest Code Status: full Family Communication: daughters updated by phone on 2/24, see note above Disposition: remain in ICU  Labs   CBC: Recent Labs  Lab 10/01/19 0300 10/01/19 0300 10/01/19 1510 10/01/19 1748 10/02/19 0430 10/03/19 0613 10/04/19 0200  WBC 27.0*  --  14.1*  --  12.7* 12.7* 13.6*  NEUTROABS 18.0*  --   --   --  10.1* 10.0* 10.5*  HGB 13.0   < > 10.7* 10.5* 10.6* 10.4* 10.1*  HCT 43.2   < > 34.3* 31.0* 33.3* 32.9* 32.4*  MCV 99.5  --  95.8  --  94.9 95.1 97.3  PLT 240  --  162  --  154 164 151   < > = values in this interval not displayed.    Basic Metabolic Panel: Recent Labs  Lab 09/27/19 1750 09/27/19 1940 10/01/19 0300 10/01/19 0300 10/01/19 1510 10/01/19 1748 10/02/19 0430 10/03/19 0613 10/04/19 0200  NA  --    < > 143   < > 139 136 139 139 142  K  --    < > 5.5*   < > 4.6 4.0 4.3 4.1 3.4*  CL  --    < > 95*  --  96*  --  96* 95* 95*  CO2  --    < > 39*  --  35*  --  35* 36* 39*  GLUCOSE  --    < > 315*  --  214*  --  191*  179* 130*  BUN  --    < > 72*  --  77*  --  76* 69* 65*  CREATININE  --    < > 1.28*  --  1.37*  --  1.23 1.02 1.01  CALCIUM  --    < > 8.1*  --  7.7*  --  8.1* 7.9* 7.8*  MG 2.5*  --   --   --   --   --  2.8*  --   --   PHOS 5.0*  --   --   --   --   --  2.3*  --   --    < > = values in this  interval not displayed.   GFR: Estimated Creatinine Clearance: 111.2 mL/min (by C-G formula based on SCr of 1.01 mg/dL). Recent Labs  Lab 09/28/19 0445 09/29/19 0320 09/29/19 0801 09/30/19 0400 10/01/19 0300 10/01/19 1510 10/02/19 0430 10/03/19 0613 10/04/19 0200  PROCALCITON 3.80 2.21  --  1.34  --   --   --   --   --   WBC 7.2  --    < > 13.1*   < > 14.1* 12.7* 12.7* 13.6*   < > = values in this interval not displayed.    Liver Function Tests: Recent Labs  Lab 09/30/19 0400 09/30/19 1645 10/01/19 0300 10/02/19 0430 10/04/19 0200  AST 194* 103* 102* 89* 40  ALT 313* 245* 277* 255* 147*  ALKPHOS 115 103 126 91 78  BILITOT 0.8 0.6 0.8 0.6 0.7  PROT 5.8* 5.1* 6.3* 5.5* 5.4*  ALBUMIN 2.3* 2.2* 2.8* 2.3* 2.3*   No results for input(s): LIPASE, AMYLASE in the last 168 hours. No results for input(s): AMMONIA in the last 168 hours.  ABG    Component Value Date/Time   PHART 7.379 10/01/2019 1748   PCO2ART 63.4 (H) 10/01/2019 1748   PO2ART 149.0 (H) 10/01/2019 1748   HCO3 37.6 (H) 10/01/2019 1748   TCO2 40 (H) 10/01/2019 1748   ACIDBASEDEF 7.0 (H) 09/26/2019 1136   O2SAT 99.0 10/01/2019 1748     Coagulation Profile: Recent Labs  Lab 10/02/19 0430  INR 1.2    Cardiac Enzymes: No results for input(s): CKTOTAL, CKMB, CKMBINDEX, TROPONINI in the last 168 hours.  HbA1C: Hgb A1c MFr Bld  Date/Time Value Ref Range Status  09/06/2019 11:40 AM 6.3 (H) 4.8 - 5.6 % Final    Comment:    (NOTE) Pre diabetes:          5.7%-6.4% Diabetes:              >6.4% Glycemic control for   <7.0% adults with diabetes     CBG: Recent Labs  Lab 10/03/19 1617 10/03/19 2000  10/03/19 2340 10/04/19 0449 10/04/19 0737  GLUCAP 153* 126* 131* 100* 113*     Critical care time: 45 minutes     Roselie Awkward, MD [place] [facility] Pager: [phone] Cell: [phone] If no response, call [phone]

## 2019-10-04 NOTE — Progress Notes (Addendum)
ANTICOAGULATION CONSULT NOTE - Follow Up Consult  Pharmacy Consult for Heparin IV Indication: Hx PE (apixaban PTA), Afib  No Known Allergies  Patient Measurements: Height: 5\' 11"  (180.3 cm) Weight: (!) 315 lb 7.7 oz (143.1 kg) IBW/kg (Calculated) : 75.3 Heparin Dosing Weight: 108 kg  Vital Signs: Temp: 98.4 F (36.9 C) (02/24 1800) BP: 89/61 (02/24 2044) Pulse Rate: 75 (02/24 2044)  Labs: Recent Labs    10/02/19 0430 10/02/19 1427 10/02/19 2240 10/02/19 2240 10/03/19 0613 10/03/19 1438 10/04/19 0200 10/04/19 0200 10/04/19 1005 10/04/19 1612 10/04/19 1700 10/04/19 2110  HGB 10.6*  --   --   --  10.4*  --  10.1*  --   --  11.9*  --   --   HCT 33.3*  --   --   --  32.9*  --  32.4*  --   --  35.0*  --   --   PLT 154  --   --   --  164  --  151  --   --   --   --   --   APTT 31   < > 31  --  97*  --  136*  --   --   --   --   --   LABPROT 15.4*  --   --   --   --   --   --   --   --   --   --   --   INR 1.2  --   --   --   --   --   --   --   --   --   --   --   HEPARINUNFRC 0.61   < > 0.32   < > 0.87*   < > 1.02*   < > 0.65  --  >2.20* 0.47  CREATININE 1.23  --   --   --  1.02  --  1.01  --   --   --   --   --    < > = values in this interval not displayed.    Estimated Creatinine Clearance: 111.2 mL/min (by C-G formula based on SCr of 1.01 mg/dL).   Medications: Infusions:  . amiodarone Stopped (10/04/19 1000)  . feeding supplement (VITAL AF 1.2 CAL) 90 mL/hr at 10/03/19 2117  . heparin 1,500 Units/hr (10/04/19 1854)  . HYDROmorphone 4 mg/hr (10/04/19 1854)  . midazolam 7 mg/hr (10/04/19 1854)  . norepinephrine (LEVOPHED) Adult infusion 2 mcg/min (10/04/19 0916)    Assessment: 46 YOM with COVID initially continued on home apixaban for a h/o PE. Pharmacy was consulted to transition to IV heparin.  Heparin was held on 2/21 for rectal bleeding, but resolved on 2/21 and heparin restarted.  Today, 10/04/2019: Heparin level therapeutic 0.47 on heparin at 1500  unit/hr. Previous level > 2.2 likely erroneous since previous earlier today 0.65 No bleeding or complications per discussion with RN.  Goal of Therapy:  Heparin level 0.3-0.7 units/ml aPTT 66-102 seconds Monitor platelets by anticoagulation protocol: Yes   Plan:  Continue heparin IV infusion at 1500 units/hr Daily heparin level and CBC  10/06/2019, PharmD, BCPS Clinical pharmacist phone 7am- 5pmLoralee Pacas 10/04/2019 10:48 PM  Addendum:  Heparin level this AM 0.44 therapeutic, CBC stable, continue current rate and f/u HL tomorrow AM.  10/06/2019, PharmD, BCPS 10/05/2019 6:38 AM

## 2019-10-04 NOTE — Progress Notes (Signed)
ANTICOAGULATION CONSULT NOTE - Follow Up Consult  Pharmacy Consult for heparin (apixaban PTA)  Indication: H/o PE   Assessment: 23 YOM with COVID continued on home apixaban for a h/o PE. Pharmacy was consulted to transition to IV heparin.   Heparin level remains elevated at 1.02 units/ml despite rate decrease.  No bleeding reported  Goal of Therapy:  Heparin level 0.3-0.7 units/ml aPTT 66-102 seconds Monitor platelets by anticoagulation protocol: Yes   Plan:  Hold heparin for ~ 1hr Restart at  1500 units/hr Check heparin level ~ 6 hours after restart Daily CBC and HL Monitor for bleeding complications  Thanks for allowing pharmacy to be a part of this patient's care.  Talbert Cage, PharmD Clinical Pharmacist 10/04/2019 3:10 AM

## 2019-10-05 ENCOUNTER — Inpatient Hospital Stay (HOSPITAL_COMMUNITY): Payer: 59

## 2019-10-05 LAB — CBC WITH DIFFERENTIAL/PLATELET
Abs Immature Granulocytes: 0.71 10*3/uL — ABNORMAL HIGH (ref 0.00–0.07)
Basophils Absolute: 0.1 10*3/uL (ref 0.0–0.1)
Basophils Relative: 1 %
Eosinophils Absolute: 0.3 10*3/uL (ref 0.0–0.5)
Eosinophils Relative: 2 %
HCT: 35.4 % — ABNORMAL LOW (ref 39.0–52.0)
Hemoglobin: 10.7 g/dL — ABNORMAL LOW (ref 13.0–17.0)
Immature Granulocytes: 5 %
Lymphocytes Relative: 10 %
Lymphs Abs: 1.5 10*3/uL (ref 0.7–4.0)
MCH: 30.1 pg (ref 26.0–34.0)
MCHC: 30.2 g/dL (ref 30.0–36.0)
MCV: 99.7 fL (ref 80.0–100.0)
Monocytes Absolute: 0.9 10*3/uL (ref 0.1–1.0)
Monocytes Relative: 6 %
Neutro Abs: 11.9 10*3/uL — ABNORMAL HIGH (ref 1.7–7.7)
Neutrophils Relative %: 76 %
Platelets: 189 10*3/uL (ref 150–400)
RBC: 3.55 MIL/uL — ABNORMAL LOW (ref 4.22–5.81)
RDW: 17.5 % — ABNORMAL HIGH (ref 11.5–15.5)
WBC: 15.4 10*3/uL — ABNORMAL HIGH (ref 4.0–10.5)
nRBC: 0.3 % — ABNORMAL HIGH (ref 0.0–0.2)

## 2019-10-05 LAB — MAGNESIUM: Magnesium: 2.7 mg/dL — ABNORMAL HIGH (ref 1.7–2.4)

## 2019-10-05 LAB — GLUCOSE, CAPILLARY
Glucose-Capillary: 106 mg/dL — ABNORMAL HIGH (ref 70–99)
Glucose-Capillary: 140 mg/dL — ABNORMAL HIGH (ref 70–99)
Glucose-Capillary: 142 mg/dL — ABNORMAL HIGH (ref 70–99)
Glucose-Capillary: 147 mg/dL — ABNORMAL HIGH (ref 70–99)
Glucose-Capillary: 152 mg/dL — ABNORMAL HIGH (ref 70–99)
Glucose-Capillary: 164 mg/dL — ABNORMAL HIGH (ref 70–99)

## 2019-10-05 LAB — COMPREHENSIVE METABOLIC PANEL
ALT: 122 U/L — ABNORMAL HIGH (ref 0–44)
AST: 37 U/L (ref 15–41)
Albumin: 2.5 g/dL — ABNORMAL LOW (ref 3.5–5.0)
Alkaline Phosphatase: 69 U/L (ref 38–126)
Anion gap: 10 (ref 5–15)
BUN: 66 mg/dL — ABNORMAL HIGH (ref 8–23)
CO2: 39 mmol/L — ABNORMAL HIGH (ref 22–32)
Calcium: 8.1 mg/dL — ABNORMAL LOW (ref 8.9–10.3)
Chloride: 97 mmol/L — ABNORMAL LOW (ref 98–111)
Creatinine, Ser: 1.06 mg/dL (ref 0.61–1.24)
GFR calc Af Amer: >60 mL/min (ref >60)
GFR calc non Af Amer: >60 mL/min (ref >60)
Glucose, Bld: 111 mg/dL — ABNORMAL HIGH (ref 70–99)
Potassium: 4.3 mmol/L (ref 3.5–5.1)
Sodium: 146 mmol/L — ABNORMAL HIGH (ref 135–145)
Total Bilirubin: 0.6 mg/dL (ref 0.3–1.2)
Total Protein: 5.6 g/dL — ABNORMAL LOW (ref 6.5–8.1)

## 2019-10-05 LAB — HEPARIN LEVEL (UNFRACTIONATED): Heparin Unfractionated: 0.44 IU/mL (ref 0.30–0.70)

## 2019-10-05 LAB — PHOSPHORUS: Phosphorus: 4.6 mg/dL (ref 2.5–4.6)

## 2019-10-05 MED ORDER — APIXABAN 5 MG PO TABS: 5.0000 mg | ORAL_TABLET | Freq: Two times a day (BID) | ORAL | Status: DC

## 2019-10-05 MED ORDER — APIXABAN 5 MG PO TABS
5.0000 mg | ORAL_TABLET | Freq: Two times a day (BID) | ORAL | Status: DC
  Administered 2019-10-05 – 2019-10-13 (×17): 5 mg
  Filled 2019-10-05 (×17): qty 1

## 2019-10-05 MED ORDER — AMIODARONE HCL 200 MG PO TABS: 200.0000 mg | ORAL_TABLET | Freq: Every day | ORAL | Status: DC

## 2019-10-05 MED ORDER — AMIODARONE HCL 200 MG PO TABS
400.0000 mg | ORAL_TABLET | Freq: Every day | ORAL | Status: DC
  Administered 2019-10-06 – 2019-10-08 (×3): 400 mg
  Filled 2019-10-05 (×3): qty 2

## 2019-10-05 MED ORDER — FUROSEMIDE 10 MG/ML IJ SOLN
60.0000 mg | Freq: Four times a day (QID) | INTRAMUSCULAR | Status: AC
  Administered 2019-10-05 (×2): 60 mg via INTRAVENOUS
  Filled 2019-10-05 (×2): qty 6

## 2019-10-05 MED ORDER — POLYETHYLENE GLYCOL 3350 17 G PO PACK
17.0000 g | PACK | Freq: Every day | ORAL | Status: DC
  Administered 2019-10-06 – 2019-10-08 (×3): 17 g
  Filled 2019-10-05 (×3): qty 1

## 2019-10-05 NOTE — Progress Notes (Signed)
NAME:  Roy Lee, MRN:  [id number], DOB:  [birth year], LOS: 73 ADMISSION DATE:  09/27/2019, CONSULTATION DATE:  2/16 REFERRING MD:  Aileen Fass, CHIEF COMPLAINT:  Dyspnea   Brief History   64y/o male treated for COVID 19 pneumonia and PE in January, treated with actemra, steroids, antivirals and dc'd home on 3-5 L O2 and anticoagulation.  Returned to the ED 2/12 with worsening dyspnea and hypoxemia.  Moved to the ICU on 2/16 in the setting of worsening hypoxemia and atrial fibrillation with RVR.  Past Medical History  Obesity Hypertension arthitis  Significant Hospital Events   1/24 original admission, treated with actemra, steroids, antivirals 1/30 d/c home on 3-5 L O2 2/12 return to ED, admission, treated with ceftriaxone/azithro 2/16 move to ICU, intubation, bronchoscopy, prone, afib with rvr started dilt 2/18 fever, started antibiotics 2/21 early AM bright red blood per rectum, vent dyssynchrony Plateau pressure 34, afib with rvr started on amiodarone, nearly off vasopressors, held prone because oxygeantion better 2/23 oxygenation better  Consults:  PCCM  Procedures:  2/16 ETT >  2/16 L IJ CVL > 2/24 2/24 PICC>   Significant Diagnostic Tests:  2/12 CT angiogram chest > no PE, bilateral infiltrates, nodular, progressed compared to 2/6 film 2/16 CT chest> significant progression of bilateral ground glass and consolidation compared to 2/12 film 2/17 Echo> RV dilation and RV pressure overload, LVEF normal   Micro Data:  2/12 blood > negative 2/14 resp culture > GPC in pair, cluster, GPR, GNR 2/16 BAL > ngtd 2/16 BAL fungal > ngtd 2/16 BAL AFB > ngtd  2/18 blood > neg 2/18 resp culture > neg  Antimicrobials:  2/12  Ceftriaxone > 2/16  2/12  Azithro > 2/16   2/18 cefepime > 2/22 2/18 vanc > 2/22  Interim history/subjective:   Oxygenation slightly better Started back on levophed overnight titration vent down diuresed well  Objective   Blood pressure  94/65, pulse 72, temperature 99.5 F (37.5 C), resp. rate (!) 31, height _0  (1.803 m), weight (!) 144 kg, SpO2 94 %. CVP:  [0 mmHg-70 mmHg] 70 mmHg  Vent Mode: PCV FiO2 (%):  [60 %-70 %] 60 % Set Rate:  [28 bmp] 28 bmp PEEP:  [12 cmH20] 12 cmH20 Plateau Pressure:  [27 cmH20-37 cmH20] 27 cmH20   Intake/Output Summary (Last 24 hours) at 10/05/2019 1017 Last data filed at 10/05/2019 0630 Gross per 24 hour  Intake 1727.28 ml  Output 3350 ml  Net -1622.72 ml   Filed Weights   10/03/19 0323 10/04/19 0500 10/05/19 0458  Weight: (!) 139.2 kg (!) 143.1 kg (!) 144 kg    Examination:  General:  In bed on vent HENT: NCAT ETT in place PULM: Crackles/wheezing B, vent supported breathing CV: RRR, no mgr GI: BS+, soft, nontender MSK: normal bulk and tone Neuro: sedated on vent   2/25 CXR personally reviewed> perhaps improved airspace disease bilaterally, ETT in place  Resolved Hospital Problem list   Epistaxis 2/19 > none since  Lower GI bleed on 2/21, resolved, hemorrhoid Circulatory shock/Septic shock: resolved  Assessment & Plan:  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, late phase disease Plateau 34 on 2/21, acceptable Worsening vent dyssynchrony again on 2/23 Continue mechanical ventilation per ARDS protocol Target TVol 6-8cc/kgIBW Target Plateau Pressure < 30cm H20 Target driving pressure less than 15 cm of water Target PaO2 55-65: titrate PEEP/FiO2 per protocol As long as PaO2 to FiO2 ratio is less than 1:150 position in prone position for 16 hours  a day Check CVP daily if CVL in place Target CVP less than 4, diurese as necessary Ventilator associated pneumonia prevention protocol 2/25 diurese again, wean PEEP/FiO2 for SaO2 > 85%, decrease sedation   Atrial fibrillation with RVR > required amiodarone, doing well on that Tele Oral amiodarone  PE Change heparin to eliquis Monitor for bleeding Transfuse PRBC for Hgb < 7 gm/dL  AKI> improving Azotemia Volume  overloaded Monitor BMET and UOP Replace electrolytes as needed Lasix again today Family says no hemodialysis  RV overload due to severe ARDS Tele  LFT abnormalities today>continued improvement LFT PRN  Hyperglycemia with prediabetes Continue long acting, tube feeding coverage and SSI  Need for sedation for mechanical ventilation Some vent dyssynchrony PAD protocol RASS target -1 to -2 Dilaudid and versed > wean Stop oral clonazepam and oxycodone    Goals of care: family is interested in [place] ongoing aggressive management with intent for full recovery, this would include a tracheostomy. However they don't want him to go through CPR in the event of a cardiac arrest or go on dialysis.     Best practice:  Diet: tube feeding Pain/Anxiety/Delirium protocol (if indicated): yes, RASS target -1 to -2, as above VAP protocol (if indicated): yes DVT prophylaxis: eliquis GI prophylaxis: Pantoprazole for stress ulcer prophylaxis Glucose control: SSI Mobility: bed rest Code Status: full Family Communication: daughters updated by phone on 2/25 Disposition: remain in ICU  Labs   CBC: Recent Labs  Lab 10/01/19 0300 10/01/19 0300 10/01/19 1510 10/01/19 1748 10/02/19 0430 10/03/19 0613 10/04/19 0200 10/04/19 1612 10/05/19 0445  WBC 27.0*   < > 14.1*  --  12.7* 12.7* 13.6*  --  15.4*  NEUTROABS 18.0*  --   --   --  10.1* 10.0* 10.5*  --  11.9*  HGB 13.0   < > 10.7*   < > 10.6* 10.4* 10.1* 11.9* 10.7*  HCT 43.2   < > 34.3*   < > 33.3* 32.9* 32.4* 35.0* 35.4*  MCV 99.5   < > 95.8  --  94.9 95.1 97.3  --  99.7  PLT 240   < > 162  --  154 164 151  --  189   < > = values in this interval not displayed.    Basic Metabolic Panel: Recent Labs  Lab 10/01/19 1510 10/01/19 1748 10/02/19 0430 10/03/19 0613 10/04/19 0200 10/04/19 1612 10/05/19 0445  NA 139   < > 139 139 142 140 146*  K 4.6   < > 4.3 4.1 3.4* 4.2 4.3  CL 96*  --  96* 95* 95*  --  97*  CO2 35*  --  35* 36* 39*   --  39*  GLUCOSE 214*  --  191* 179* 130*  --  111*  BUN 77*  --  76* 69* 65*  --  66*  CREATININE 1.37*  --  1.23 1.02 1.01  --  1.06  CALCIUM 7.7*  --  8.1* 7.9* 7.8*  --  8.1*  MG  --   --  2.8*  --   --   --  2.7*  PHOS  --   --  2.3*  --   --   --  4.6   < > = values in this interval not displayed.   GFR: Estimated Creatinine Clearance: 106.4 mL/min (by C-G formula based on SCr of 1.06 mg/dL). Recent Labs  Lab 09/29/19 0320 09/29/19 0801 09/30/19 0400 10/01/19 0300 10/02/19 0430 10/03/19 4315 10/04/19 0200 10/05/19  0445  PROCALCITON 2.21  --  1.34  --   --   --   --   --   WBC  --    < > 13.1*   < > 12.7* 12.7* 13.6* 15.4*   < > = values in this interval not displayed.    Liver Function Tests: Recent Labs  Lab 09/30/19 1645 10/01/19 0300 10/02/19 0430 10/04/19 0200 10/05/19 0445  AST 103* 102* 89* 40 37  ALT 245* 277* 255* 147* 122*  ALKPHOS 103 126 91 78 69  BILITOT 0.6 0.8 0.6 0.7 0.6  PROT 5.1* 6.3* 5.5* 5.4* 5.6*  ALBUMIN 2.2* 2.8* 2.3* 2.3* 2.5*   No results for input(s): LIPASE, AMYLASE in the last 168 hours. No results for input(s): AMMONIA in the last 168 hours.  ABG    Component Value Date/Time   PHART 7.335 (L) 10/04/2019 1612   PCO2ART 80.8 (HH) 10/04/2019 1612   PO2ART 65.0 (L) 10/04/2019 1612   HCO3 43.0 (H) 10/04/2019 1612   TCO2 45 (H) 10/04/2019 1612   ACIDBASEDEF 7.0 (H) 09/26/2019 1136   O2SAT 89.0 10/04/2019 1612     Coagulation Profile: Recent Labs  Lab 10/02/19 0430  INR 1.2    Cardiac Enzymes: No results for input(s): CKTOTAL, CKMB, CKMBINDEX, TROPONINI in the last 168 hours.  HbA1C: Hgb A1c MFr Bld  Date/Time Value Ref Range Status  09/06/2019 11:40 AM 6.3 (H) 4.8 - 5.6 % Final    Comment:    (NOTE) Pre diabetes:          5.7%-6.4% Diabetes:              >6.4% Glycemic control for   <7.0% adults with diabetes     CBG: Recent Labs  Lab 10/04/19 1548 10/04/19 2013 10/04/19 2345 10/05/19 0332 10/05/19 0746   GLUCAP 94 96 119* 147* 142*     Critical care time: 35 minutes     Roselie Awkward, MD [place] Pager: [phone] Cell: [phone] If no response, call [phone]

## 2019-10-05 NOTE — Progress Notes (Signed)
Attempted to call daughter Luther Parody for update, no answer at this time.

## 2019-10-05 NOTE — Progress Notes (Signed)
ANTICOAGULATION CONSULT NOTE - Follow Up Consult  Pharmacy Consult for Apixaban Indication: Hx PE (apixaban PTA), Afib  No Known Allergies  Patient Measurements: Height: 5\' 11"  (180.3 cm) Weight: (!) 317 lb 7.4 oz (144 kg) IBW/kg (Calculated) : 75.3 Heparin Dosing Weight: 108 kg  Vital Signs: Temp: 99 F (37.2 C) (02/25 1000) Temp Source: Esophageal (02/25 0800) BP: 105/66 (02/25 1000) Pulse Rate: 83 (02/25 1000)  Labs: Recent Labs    10/02/19 2240 10/02/19 2240 10/03/19 0613 10/03/19 1438 10/04/19 0200 10/04/19 1005 10/04/19 1612 10/04/19 1700 10/04/19 2110 10/05/19 0445  HGB  --    < > 10.4*  --  10.1*  --  11.9*  --   --  10.7*  HCT  --    < > 32.9*  --  32.4*  --  35.0*  --   --  35.4*  PLT  --   --  164  --  151  --   --   --   --  189  APTT 31  --  97*  --  136*  --   --   --   --   --   HEPARINUNFRC 0.32   < > 0.87*   < > 1.02*   < >  --  >2.20* 0.47 0.44  CREATININE  --   --  1.02  --  1.01  --   --   --   --  1.06   < > = values in this interval not displayed.    Estimated Creatinine Clearance: 106.4 mL/min (by C-G formula based on SCr of 1.06 mg/dL).   Medications: Infusions:  . feeding supplement (VITAL AF 1.2 CAL) 90 mL/hr at 10/03/19 2117  . HYDROmorphone 2 mg/hr (10/05/19 1021)  . midazolam 5 mg/hr (10/05/19 1000)  . norepinephrine (LEVOPHED) Adult infusion 6 mcg/min (10/05/19 1000)    Assessment: 69 YOM with COVID initially continued on home apixaban for a h/o PE. Pharmacy was consulted to transition from IV heparin back to apixaban  Heparin was held on 2/21 for rectal bleeding, but resolved on 2/21 and heparin restarted.  No further s/s bleeding reported today.  Heparin levels remain therapeutic.  CBC stable.  Goal of Therapy:  Monitor platelets by anticoagulation protocol: Yes   Plan:  Hold heparin x1 hour Begin apixaban 5 mg PO BID. Monitor renal function, CBC, s/s bleeding.  3/21 PharmD, BCPS Clinical pharmacist phone 7am-  5pm: [phone] 10/05/2019 11:24 AM

## 2019-10-06 ENCOUNTER — Inpatient Hospital Stay (HOSPITAL_COMMUNITY): Payer: 59

## 2019-10-06 LAB — GLUCOSE, CAPILLARY
Glucose-Capillary: 158 mg/dL — ABNORMAL HIGH (ref 70–99)
Glucose-Capillary: 156 mg/dL — ABNORMAL HIGH (ref 70–99)
Glucose-Capillary: 143 mg/dL — ABNORMAL HIGH (ref 70–99)
Glucose-Capillary: 152 mg/dL — ABNORMAL HIGH (ref 70–99)
Glucose-Capillary: 190 mg/dL — ABNORMAL HIGH (ref 70–99)

## 2019-10-06 LAB — COMPREHENSIVE METABOLIC PANEL
ALT: 96 U/L — ABNORMAL HIGH (ref 0–44)
AST: 34 U/L (ref 15–41)
Albumin: 2.4 g/dL — ABNORMAL LOW (ref 3.5–5.0)
Alkaline Phosphatase: 72 U/L (ref 38–126)
Anion gap: 6 (ref 5–15)
BUN: 57 mg/dL — ABNORMAL HIGH (ref 8–23)
CO2: 45 mmol/L — ABNORMAL HIGH (ref 22–32)
Calcium: 8.1 mg/dL — ABNORMAL LOW (ref 8.9–10.3)
Chloride: 96 mmol/L — ABNORMAL LOW (ref 98–111)
Creatinine, Ser: 0.97 mg/dL (ref 0.61–1.24)
GFR calc Af Amer: >60 mL/min (ref >60)
GFR calc non Af Amer: >60 mL/min (ref >60)
Glucose, Bld: 155 mg/dL — ABNORMAL HIGH (ref 70–99)
Potassium: 4.3 mmol/L (ref 3.5–5.1)
Sodium: 147 mmol/L — ABNORMAL HIGH (ref 135–145)
Total Bilirubin: 1 mg/dL (ref 0.3–1.2)
Total Protein: 5.8 g/dL — ABNORMAL LOW (ref 6.5–8.1)

## 2019-10-06 LAB — CBC WITH DIFFERENTIAL/PLATELET
Abs Immature Granulocytes: 0.36 10*3/uL — ABNORMAL HIGH (ref 0.00–0.07)
Basophils Absolute: 0 10*3/uL (ref 0.0–0.1)
Basophils Relative: 0 %
Eosinophils Absolute: 0.2 10*3/uL (ref 0.0–0.5)
Eosinophils Relative: 2 %
HCT: 34 % — ABNORMAL LOW (ref 39.0–52.0)
Hemoglobin: 10 g/dL — ABNORMAL LOW (ref 13.0–17.0)
Immature Granulocytes: 3 %
Lymphocytes Relative: 6 %
Lymphs Abs: 0.9 10*3/uL (ref 0.7–4.0)
MCH: 29.7 pg (ref 26.0–34.0)
MCHC: 29.4 g/dL — ABNORMAL LOW (ref 30.0–36.0)
MCV: 100.9 fL — ABNORMAL HIGH (ref 80.0–100.0)
Monocytes Absolute: 0.8 10*3/uL (ref 0.1–1.0)
Monocytes Relative: 6 %
Neutro Abs: 11.1 10*3/uL — ABNORMAL HIGH (ref 1.7–7.7)
Neutrophils Relative %: 83 %
Platelets: 181 10*3/uL (ref 150–400)
RBC: 3.37 MIL/uL — ABNORMAL LOW (ref 4.22–5.81)
RDW: 17.6 % — ABNORMAL HIGH (ref 11.5–15.5)
WBC: 13.4 10*3/uL — ABNORMAL HIGH (ref 4.0–10.5)
nRBC: 0 % (ref 0.0–0.2)

## 2019-10-06 MED ORDER — DEXMEDETOMIDINE HCL IN NACL 400 MCG/100ML IV SOLN
0.0000 ug/kg/h | INTRAVENOUS | Status: DC
  Administered 2019-10-06 (×2): 0.6 ug/kg/h via INTRAVENOUS
  Administered 2019-10-06: 0.4 ug/kg/h via INTRAVENOUS
  Administered 2019-10-07 (×8): 1.2 ug/kg/h via INTRAVENOUS
  Administered 2019-10-07: 1 ug/kg/h via INTRAVENOUS
  Administered 2019-10-07 – 2019-10-09 (×11): 1.2 ug/kg/h via INTRAVENOUS
  Filled 2019-10-06 (×25): qty 100

## 2019-10-06 MED ORDER — FUROSEMIDE 10 MG/ML IJ SOLN
60.0000 mg | Freq: Four times a day (QID) | INTRAMUSCULAR | Status: AC
  Administered 2019-10-06 (×2): 60 mg via INTRAVENOUS
  Filled 2019-10-06 (×2): qty 6

## 2019-10-06 NOTE — Progress Notes (Signed)
Pt's daughter called for update.Nurse answered her questions to best of her ability.  Levophed gtt back on after increasing sedation to tolerate vent.  Temperature slightly elevated.  Will continue to monitor.

## 2019-10-06 NOTE — Progress Notes (Signed)
Attempted to call Luther Parody, the pt's family contact, no response.

## 2019-10-06 NOTE — Progress Notes (Signed)
NAME:  DARCEL FRANE, MRN:  [id number], DOB:  18-Dec-1955, LOS: 24 ADMISSION DATE:  09/16/2019, CONSULTATION DATE:  2/16 REFERRING MD:  Aileen Fass, CHIEF COMPLAINT:  Dyspnea   Brief History   64y/o male treated for COVID 19 pneumonia and PE in January, treated with actemra, steroids, antivirals and dc'd home on 3-5 L O2 and anticoagulation.  Returned to the ED 2/12 with worsening dyspnea and hypoxemia.  Moved to the ICU on 2/16 in the setting of worsening hypoxemia and atrial fibrillation with RVR.  Past Medical History  Obesity Hypertension arthitis  Significant Hospital Events   1/24 original admission, treated with actemra, steroids, antivirals 1/30 d/c home on 3-5 L O2 2/12 return to ED, admission, treated with ceftriaxone/azithro 2/16 move to ICU, intubation, bronchoscopy, prone, afib with rvr started dilt 2/18 fever, started antibiotics 2/21 early AM bright red blood per rectum, vent dyssynchrony Plateau pressure 34, afib with rvr started on amiodarone, nearly off vasopressors, held prone because oxygeantion better 2/23 oxygenation better  Consults:  PCCM  Procedures:  2/16 ETT >  2/16 L IJ CVL > 2/24 2/24 PICC>   Significant Diagnostic Tests:  2/12 CT angiogram chest > no PE, bilateral infiltrates, nodular, progressed compared to 2/6 film 2/16 CT chest> significant progression of bilateral ground glass and consolidation compared to 2/12 film 2/17 Echo> RV dilation and RV pressure overload, LVEF normal   Micro Data:  2/12 blood > negative 2/14 resp culture > GPC in pair, cluster, GPR, GNR 2/16 BAL > ngtd 2/16 BAL fungal > ngtd 2/16 BAL AFB > ngtd  2/18 blood > neg 2/18 resp culture > neg  Antimicrobials:  2/12  Ceftriaxone > 2/16  2/12  Azithro > 2/16   2/18 cefepime > 2/22 2/18 vanc > 2/22  Interim history/subjective:   [place] on pressure support  Objective   Blood pressure 109/63, pulse 85, temperature 99.1 F (37.3 C), resp. rate (!) 28, height 5'  11" (1.803 m), weight (!) 137.6 kg, SpO2 95 %.    Vent Mode: PCV FiO2 (%):  [55 %-70 %] 70 % Set Rate:  [28 bmp] 28 bmp PEEP:  [10 JGG83-66 cmH20] 10 cmH20 Plateau Pressure:  [27 cmH20-37 cmH20] 36 cmH20   Intake/Output Summary (Last 24 hours) at 10/06/2019 0756 Last data filed at 10/06/2019 0600 Gross per 24 hour  Intake 3432.56 ml  Output 4350 ml  Net -917.44 ml   Filed Weights   10/04/19 0500 10/05/19 0458 10/06/19 0500  Weight: (!) 143.1 kg (!) 144 kg (!) 137.6 kg    Examination:  General:  In bed on vent HENT: NCAT ETT in place PULM: CTA B, vent supported breathing CV: RRR, no mgr GI: BS+, soft, nontender MSK: normal bulk and tone Neuro: sedated on vent   2/25 CXR personally reviewed> perhaps improved airspace disease bilaterally, ETT in place  Resolved Hospital Problem list   Epistaxis 2/19 > none since  Lower GI bleed on 2/21, resolved, hemorrhoid Circulatory shock/Septic shock: resolved RV overload due to severe ARDS  Assessment & Plan:  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, late phase disease Plateau 34 on 2/26 Acute pulmonary edema Continue mechanical ventilation per ARDS protocol Target TVol 6-8cc/kgIBW Target Plateau Pressure < 30cm H20 Target driving pressure less than 15 cm of water Target PaO2 55-65: titrate PEEP/FiO2 per protocol As long as PaO2 to FiO2 ratio is less than 1:150 position in prone position for 16 hours a day Check CVP daily if CVL in place Target CVP  less than 4, diurese as necessary Ventilator associated pneumonia prevention protocol 2/26 repeat diuresis, wean on pressure support  Atrial fibrillation with RVR > required amiodarone, doing well on that Tele Oral amiodarone  PE Eliquis Monitor for bleeding Transfuse PRBC for Hgb < 7 gm/dL  AKI> improving Azotemia Volume overloaded > still up on 2/26 Monitor BMET and UOP Replace electrolytes as needed  Shock liver>continued improvement LFT prn  Hyperglycemia with  prediabetes Continue long acting, tube feeding coverage and SSI  Need for sedation for mechanical ventilation Some vent dyssynchrony PAD protocol, RASS target -1 to -2 Stop versed infusion Use precedex if needed  Goals of care: family is interested in [place] ongoing aggressive management with intent for full recovery, this would include a tracheostomy. However they don't want him to go through CPR in the event of a cardiac arrest or go on dialysis.     Best practice:  Diet: tube feeding Pain/Anxiety/Delirium protocol (if indicated): yes, RASS target -1 to -2, as above VAP protocol (if indicated): yes DVT prophylaxis: eliquis GI prophylaxis: Pantoprazole for stress ulcer prophylaxis Glucose control: SSI Mobility: bed rest Code Status: full Family Communication: daughters updated by phone on 2/25 Disposition: remain in ICU  Labs   CBC: Recent Labs  Lab 10/02/19 0430 10/02/19 0430 10/03/19 0613 10/04/19 0200 10/04/19 1612 10/05/19 0445 10/06/19 0413  WBC 12.7*  --  12.7* 13.6*  --  15.4* 13.4*  NEUTROABS 10.1*  --  10.0* 10.5*  --  11.9* 11.1*  HGB 10.6*   < > 10.4* 10.1* 11.9* 10.7* 10.0*  HCT 33.3*   < > 32.9* 32.4* 35.0* 35.4* 34.0*  MCV 94.9  --  95.1 97.3  --  99.7 100.9*  PLT 154  --  164 151  --  189 181   < > = values in this interval not displayed.    Basic Metabolic Panel: Recent Labs  Lab 10/02/19 0430 10/02/19 0430 10/03/19 0613 10/04/19 0200 10/04/19 1612 10/05/19 0445 10/06/19 0413  NA 139   < > 139 142 140 146* 147*  K 4.3   < > 4.1 3.4* 4.2 4.3 4.3  CL 96*  --  95* 95*  --  97* 96*  CO2 35*  --  36* 39*  --  39* 45*  GLUCOSE 191*  --  179* 130*  --  111* 155*  BUN 76*  --  69* 65*  --  66* 57*  CREATININE 1.23  --  1.02 1.01  --  1.06 0.97  CALCIUM 8.1*  --  7.9* 7.8*  --  8.1* 8.1*  MG 2.8*  --   --   --   --  2.7*  --   PHOS 2.3*  --   --   --   --  4.6  --    < > = values in this interval not displayed.   GFR: Estimated Creatinine  Clearance: 113.3 mL/min (by C-G formula based on SCr of 0.97 mg/dL). Recent Labs  Lab 09/30/19 0400 10/01/19 0300 10/03/19 0613 10/04/19 0200 10/05/19 0445 10/06/19 0413  PROCALCITON 1.34  --   --   --   --   --   WBC 13.1*   < > 12.7* 13.6* 15.4* 13.4*   < > = values in this interval not displayed.    Liver Function Tests: Recent Labs  Lab 10/01/19 0300 10/02/19 0430 10/04/19 0200 10/05/19 0445 10/06/19 0413  AST 102* 89* 40 37 34  ALT 277* 255* 147* 122*  96*  ALKPHOS 126 91 78 69 72  BILITOT 0.8 0.6 0.7 0.6 1.0  PROT 6.3* 5.5* 5.4* 5.6* 5.8*  ALBUMIN 2.8* 2.3* 2.3* 2.5* 2.4*   No results for input(s): LIPASE, AMYLASE in the last 168 hours. No results for input(s): AMMONIA in the last 168 hours.  ABG    Component Value Date/Time   PHART 7.335 (L) 10/04/2019 1612   PCO2ART 80.8 (HH) 10/04/2019 1612   PO2ART 65.0 (L) 10/04/2019 1612   HCO3 43.0 (H) 10/04/2019 1612   TCO2 45 (H) 10/04/2019 1612   ACIDBASEDEF 7.0 (H) 09/26/2019 1136   O2SAT 89.0 10/04/2019 1612     Coagulation Profile: Recent Labs  Lab 10/02/19 0430  INR 1.2    Cardiac Enzymes: No results for input(s): CKTOTAL, CKMB, CKMBINDEX, TROPONINI in the last 168 hours.  HbA1C: Hgb A1c MFr Bld  Date/Time Value Ref Range Status  09/06/2019 11:40 AM 6.3 (H) 4.8 - 5.6 % Final    Comment:    (NOTE) Pre diabetes:          5.7%-6.4% Diabetes:              >6.4% Glycemic control for   <7.0% adults with diabetes     CBG: Recent Labs  Lab 10/05/19 1601 10/05/19 2001 10/05/19 2338 10/06/19 0442 10/06/19 0732  GLUCAP 152* 140* 106* 158* 156*     Critical care time: 40 minutes     Roselie Awkward, MD [facility] Pager: [phone] Cell: [phone] If no response, call [phone]

## 2019-10-06 NOTE — Progress Notes (Signed)
[facility]  I tried to call Caitlyn for an update but could not get through.  Heber Loup, MD [facility] Pager: [phone] Cell: [phone] If no response, call [phone]

## 2019-10-07 ENCOUNTER — Inpatient Hospital Stay (HOSPITAL_COMMUNITY): Payer: 59

## 2019-10-07 DIAGNOSIS — J189 Pneumonia, unspecified organism: Secondary | ICD-10-CM

## 2019-10-07 DIAGNOSIS — Z9911 Dependence on respirator [ventilator] status: Secondary | ICD-10-CM

## 2019-10-07 DIAGNOSIS — Z978 Presence of other specified devices: Secondary | ICD-10-CM

## 2019-10-07 LAB — CBC WITH DIFFERENTIAL/PLATELET
Abs Immature Granulocytes: 0.28 10*3/uL — ABNORMAL HIGH (ref 0.00–0.07)
Basophils Absolute: 0 10*3/uL (ref 0.0–0.1)
Basophils Relative: 0 %
Eosinophils Absolute: 0.2 10*3/uL (ref 0.0–0.5)
Eosinophils Relative: 2 %
HCT: 34.2 % — ABNORMAL LOW (ref 39.0–52.0)
Hemoglobin: 10.4 g/dL — ABNORMAL LOW (ref 13.0–17.0)
Immature Granulocytes: 2 %
Lymphocytes Relative: 8 %
Lymphs Abs: 1 10*3/uL (ref 0.7–4.0)
MCH: 29.5 pg (ref 26.0–34.0)
MCHC: 30.4 g/dL (ref 30.0–36.0)
MCV: 96.9 fL (ref 80.0–100.0)
Monocytes Absolute: 0.9 10*3/uL (ref 0.1–1.0)
Monocytes Relative: 7 %
Neutro Abs: 10.4 10*3/uL — ABNORMAL HIGH (ref 1.7–7.7)
Neutrophils Relative %: 81 %
Platelets: 233 10*3/uL (ref 150–400)
RBC: 3.53 MIL/uL — ABNORMAL LOW (ref 4.22–5.81)
RDW: 17.8 % — ABNORMAL HIGH (ref 11.5–15.5)
WBC: 12.9 10*3/uL — ABNORMAL HIGH (ref 4.0–10.5)
nRBC: 0 % (ref 0.0–0.2)

## 2019-10-07 LAB — POCT I-STAT 7, (LYTES, BLD GAS, ICA,H+H)
Acid-Base Excess: 22 mmol/L — ABNORMAL HIGH (ref 0.0–2.0)
Acid-Base Excess: 23 mmol/L — ABNORMAL HIGH (ref 0.0–2.0)
Bicarbonate: 48.1 mmol/L — ABNORMAL HIGH (ref 20.0–28.0)
Bicarbonate: 49.1 mmol/L — ABNORMAL HIGH (ref 20.0–28.0)
Calcium, Ion: 1.11 mmol/L — ABNORMAL LOW (ref 1.15–1.40)
Calcium, Ion: 1.08 mmol/L — ABNORMAL LOW (ref 1.15–1.40)
HCT: 31 % — ABNORMAL LOW (ref 39.0–52.0)
HCT: 32 % — ABNORMAL LOW (ref 39.0–52.0)
Hemoglobin: 10.5 g/dL — ABNORMAL LOW (ref 13.0–17.0)
Hemoglobin: 10.9 g/dL — ABNORMAL LOW (ref 13.0–17.0)
O2 Saturation: 92 %
O2 Saturation: 91 %
Patient temperature: 38.9
Patient temperature: 98
Potassium: 4 mmol/L (ref 3.5–5.1)
Potassium: 3.8 mmol/L (ref 3.5–5.1)
Sodium: 144 mmol/L (ref 135–145)
Sodium: 146 mmol/L — ABNORMAL HIGH (ref 135–145)
TCO2: 50 mmol/L — ABNORMAL HIGH (ref 22–32)
TCO2: >50 mmol/L — ABNORMAL HIGH (ref 22–32)
pCO2 arterial: 63 mmHg — ABNORMAL HIGH (ref 32.0–48.0)
pCO2 arterial: 62.6 mmHg — ABNORMAL HIGH (ref 32.0–48.0)
pH, Arterial: 7.498 — ABNORMAL HIGH (ref 7.350–7.450)
pH, Arterial: 7.501 — ABNORMAL HIGH (ref 7.350–7.450)
pO2, Arterial: 69 mmHg — ABNORMAL LOW (ref 83.0–108.0)
pO2, Arterial: 57 mmHg — ABNORMAL LOW (ref 83.0–108.0)

## 2019-10-07 LAB — COMPREHENSIVE METABOLIC PANEL
ALT: 73 U/L — ABNORMAL HIGH (ref 0–44)
AST: 30 U/L (ref 15–41)
Albumin: 2.5 g/dL — ABNORMAL LOW (ref 3.5–5.0)
Alkaline Phosphatase: 66 U/L (ref 38–126)
Anion gap: 11 (ref 5–15)
BUN: 56 mg/dL — ABNORMAL HIGH (ref 8–23)
CO2: 43 mmol/L — ABNORMAL HIGH (ref 22–32)
Calcium: 8.5 mg/dL — ABNORMAL LOW (ref 8.9–10.3)
Chloride: 95 mmol/L — ABNORMAL LOW (ref 98–111)
Creatinine, Ser: 0.89 mg/dL (ref 0.61–1.24)
GFR calc Af Amer: >60 mL/min (ref >60)
GFR calc non Af Amer: >60 mL/min (ref >60)
Glucose, Bld: 172 mg/dL — ABNORMAL HIGH (ref 70–99)
Potassium: 4.3 mmol/L (ref 3.5–5.1)
Sodium: 149 mmol/L — ABNORMAL HIGH (ref 135–145)
Total Bilirubin: 1 mg/dL (ref 0.3–1.2)
Total Protein: 6 g/dL — ABNORMAL LOW (ref 6.5–8.1)

## 2019-10-07 LAB — GLUCOSE, CAPILLARY
Glucose-Capillary: 128 mg/dL — ABNORMAL HIGH (ref 70–99)
Glucose-Capillary: 135 mg/dL — ABNORMAL HIGH (ref 70–99)
Glucose-Capillary: 154 mg/dL — ABNORMAL HIGH (ref 70–99)
Glucose-Capillary: 170 mg/dL — ABNORMAL HIGH (ref 70–99)
Glucose-Capillary: 170 mg/dL — ABNORMAL HIGH (ref 70–99)
Glucose-Capillary: 175 mg/dL — ABNORMAL HIGH (ref 70–99)
Glucose-Capillary: 180 mg/dL — ABNORMAL HIGH (ref 70–99)

## 2019-10-07 MED ORDER — ETOMIDATE 2 MG/ML IV SOLN
INTRAVENOUS | Status: AC
  Administered 2019-10-07: 20 mg via INTRAVENOUS
  Filled 2019-10-07: qty 20

## 2019-10-07 MED ORDER — ROCURONIUM BROMIDE 50 MG/5ML IV SOLN: 50.0000 mg | Freq: Once | INTRAVENOUS | Status: DC

## 2019-10-07 MED ORDER — ETOMIDATE 2 MG/ML IV SOLN: 20.0000 mg | Freq: Once | INTRAVENOUS | Status: AC

## 2019-10-07 MED ORDER — ROCURONIUM BROMIDE 10 MG/ML (PF) SYRINGE
PREFILLED_SYRINGE | INTRAVENOUS | Status: AC
  Administered 2019-10-07: 100 mg via INTRAVENOUS
  Filled 2019-10-07: qty 10

## 2019-10-07 MED ORDER — ROCURONIUM BROMIDE 10 MG/ML (PF) SYRINGE: 50.0000 mg | PREFILLED_SYRINGE | Freq: Once | INTRAVENOUS | Status: AC

## 2019-10-07 MED ORDER — MIDAZOLAM HCL 2 MG/2ML IJ SOLN
INTRAMUSCULAR | Status: AC
  Administered 2019-10-07: 2 mg via INTRAVENOUS
  Filled 2019-10-07: qty 2

## 2019-10-07 MED ORDER — FUROSEMIDE 10 MG/ML IJ SOLN
40.0000 mg | Freq: Four times a day (QID) | INTRAMUSCULAR | Status: AC
  Administered 2019-10-07 (×3): 40 mg via INTRAVENOUS
  Filled 2019-10-07 (×3): qty 4

## 2019-10-07 MED ORDER — SODIUM CHLORIDE 0.9 % IV SOLN
1.0000 g | Freq: Three times a day (TID) | INTRAVENOUS | Status: DC
  Administered 2019-10-07 – 2019-10-13 (×19): 1 g via INTRAVENOUS
  Filled 2019-10-07 (×23): qty 1

## 2019-10-07 MED ORDER — FREE WATER
200.0000 mL | Status: DC
  Administered 2019-10-07 – 2019-10-13 (×34): 200 mL

## 2019-10-07 MED ORDER — METOLAZONE 5 MG PO TABS
2.5000 mg | ORAL_TABLET | Freq: Once | ORAL | Status: AC
  Administered 2019-10-07: 2.5 mg via ORAL
  Filled 2019-10-07: qty 1

## 2019-10-07 MED ORDER — MIDAZOLAM HCL 2 MG/2ML IJ SOLN
2.0000 mg | INTRAMUSCULAR | Status: DC | PRN
  Administered 2019-10-07 – 2019-10-08 (×4): 2 mg via INTRAVENOUS
  Administered 2019-10-08: 4 mg via INTRAVENOUS
  Administered 2019-10-08 (×2): 2 mg via INTRAVENOUS
  Administered 2019-10-08: 4 mg via INTRAVENOUS
  Administered 2019-10-08: 2 mg via INTRAVENOUS
  Administered 2019-10-09 (×3): 4 mg via INTRAVENOUS
  Administered 2019-10-09: 2 mg via INTRAVENOUS
  Filled 2019-10-07: qty 4
  Filled 2019-10-07: qty 2
  Filled 2019-10-07: qty 4
  Filled 2019-10-07 (×3): qty 2
  Filled 2019-10-07 (×2): qty 4
  Filled 2019-10-07 (×4): qty 2
  Filled 2019-10-07: qty 4

## 2019-10-07 NOTE — Progress Notes (Signed)
NAME:  Roy Lee, MRN:  [id number], DOB:  08-Jun-1956, LOS: 31 ADMISSION DATE:  10/08/2019, CONSULTATION DATE:  2/16 REFERRING MD:  Aileen Fass, CHIEF COMPLAINT:  Dyspnea   Brief History   64y/o male treated for COVID 19 pneumonia and PE in January, treated with actemra, steroids, antivirals and dc'd home on 3-5 L O2 and anticoagulation.  Returned to the ED 2/12 with worsening dyspnea and hypoxemia.  Moved to the ICU on 2/16 in the setting of worsening hypoxemia and atrial fibrillation with RVR.  Past Medical History  Obesity Hypertension arthitis  Significant Hospital Events   1/24 original admission, treated with actemra, steroids, antivirals 1/30 d/c home on 3-5 L O2 2/12 return to ED, admission, treated with ceftriaxone/azithro 2/16 move to ICU, intubation, bronchoscopy, prone, afib with rvr started dilt 2/18 fever, started antibiotics 2/21 early AM bright red blood per rectum, vent dyssynchrony Plateau pressure 34, afib with rvr started on amiodarone, nearly off vasopressors, held prone because oxygeantion better 2/23 oxygenation better  Consults:  PCCM  Procedures:  2/16 ETT >  2/16 L IJ CVL > 2/24 2/24 PICC>   Significant Diagnostic Tests:  2/12 CT angiogram chest > no PE, bilateral infiltrates, nodular, progressed compared to 2/6 film 2/16 CT chest> significant progression of bilateral ground glass and consolidation compared to 2/12 film 2/17 Echo> RV dilation and RV pressure overload, LVEF normal   Micro Data:  2/12 blood > negative 2/14 resp culture > GPC in pair, cluster, GPR, GNR 2/16 BAL > ngtd 2/16 BAL fungal > ngtd 2/16 BAL AFB > ngtd  2/18 blood > neg 2/18 resp culture > neg  Antimicrobials:  2/12  Ceftriaxone > 2/16  2/12  Azithro > 2/16   2/18 cefepime > 2/22 2/18 vanc > 2/22  Interim history/subjective:   Desaturation events overnight.  Also additional fevers.  White blood cell count stable.  Objective   Blood pressure 113/75, pulse (!)  58, temperature (!) 101.7 F (38.7 C), resp. rate (!) 28, height _0  (1.803 m), weight (!) 139.2 kg, SpO2 95 %.    Vent Mode: PCV FiO2 (%):  [70 %-80 %] 80 % Set Rate:  [28 bmp] 28 bmp PEEP:  [10 cmH20] 10 cmH20 Plateau Pressure:  [33 cmH20-36 cmH20] 35 cmH20   Intake/Output Summary (Last 24 hours) at 10/07/2019 0745 Last data filed at 10/07/2019 0700 Gross per 24 hour  Intake 3323.16 ml  Output 3145 ml  Net 178.16 ml   Filed Weights   10/05/19 0458 10/06/19 0500 10/07/19 0500  Weight: (!) 144 kg (!) 137.6 kg (!) 139.2 kg    Examination:  General: Critically ill intubated on mechanical ventilation HENT: NCAT endotracheal tube dislodged and replaced.  Please see separate procedure note PULM: Bilateral ventilated breath sounds CV: Tachycardic, regular S1-S2 GI: Soft morbid obese abdomen MSK: Significant bilateral lower extremity edema Neuro: Sedated on mechanical support   10/07/2019: Chest x-ray, bilateral low volumes significant opacities present. The patient's images have been independently reviewed by me.    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, late phase disease Plateau 34 on 2/26 Acute pulmonary edema New Sepsis, not present on admission, elevated white count, fevers Continue mechanical ventilation per ARDS protocol Target TVol 6-8cc/kgIBW Target Plateau Pressure < 30cm H20 Target driving pressure less than  15 cm of water Target PaO2 55-65: titrate PEEP/FiO2 per protocol As long as PaO2 to FiO2 ratio is less than 1:150 position in prone position for 16 hours a day Check CVP daily if CVL in place Target CVP less than 4, diurese as necessary Ventilator associated pneumonia prevention protocol 10/07/2019: Remains on full mechanical support at this time.   Endotracheal tube exchanged and reintubated.  Please see separate procedure note. Blood cultures pending, tracheal aspirate pending. Start meropenem empirically.  De-escalate pending culture results Just recovering from AKI.  Would prefer to avoid vancomycin. If GPC's on trach aspirate smear would start linezolid.  Atrial fibrillation with RVR > required amiodarone, doing well on that Continue amiodarone Telemetry  PE Eliquis continued, follow H&H  AKI> improving Azotemia Positive cumulative fluid balance Follow urine output and basic metabolic panel Lasix 40 mg every 6 hours x3 doses, metolazone 2.5 mg x 1  Shock liver>continued improvement Follow LFTs  Hyperglycemia with prediabetes Long-acting insulin plus tube feed coverage with SSI  Need for sedation for mechanical ventilation Some vent dyssynchrony PAD protocol sedation goal RASS -1 to -2.  Goals of care: family is interested in [place] ongoing aggressive management with intent for full recovery, this would include a tracheostomy. However they don't want him to go through CPR in the event of a cardiac arrest or go on dialysis.     Best practice:  Diet: tube feeding Pain/Anxiety/Delirium protocol (if indicated): yes, RASS target -1 to -2, as above VAP protocol (if indicated): yes DVT prophylaxis: eliquis GI prophylaxis: Pantoprazole for stress ulcer prophylaxis Glucose control: SSI Mobility: bed rest Code Status: full Family Communication: daughters updated by phone on 2/25 Disposition: remain in ICU  Labs   CBC: Recent Labs  Lab 10/03/19 0613 10/03/19 0613 10/04/19 0200 10/04/19 0200 10/04/19 1612 10/05/19 0445 10/06/19 0413 10/07/19 0337 10/07/19 0435  WBC 12.7*  --  13.6*  --   --  15.4* 13.4*  --  12.9*  NEUTROABS 10.0*  --  10.5*  --   --  11.9* 11.1*  --  10.4*  HGB 10.4*   < > 10.1*   < > 11.9* 10.7* 10.0* 10.5* 10.4*  HCT 32.9*   < > 32.4*   < > 35.0* 35.4* 34.0* 31.0* 34.2*  MCV 95.1  --   97.3  --   --  99.7 100.9*  --  96.9  PLT 164  --  151  --   --  189 181  --  233   < > = values in this interval not displayed.    Basic Metabolic Panel: Recent Labs  Lab 10/02/19 0430 10/02/19 0430 10/03/19 2426 10/03/19 0613 10/04/19 0200 10/04/19 0200 10/04/19 1612 10/05/19 0445 10/06/19 0413 10/07/19 0337 10/07/19 0435  NA 139   < > 139   < > 142   < > 140 146* 147* 144 149*  K 4.3   < > 4.1   < > 3.4*   < > 4.2 4.3 4.3 4.0 4.3  CL 96*   < > 95*  --  95*  --   --  97* 96*  --  95*  CO2 35*   < > 36*  --  39*  --   --  39* 45*  --  43*  GLUCOSE 191*   < > 179*  --  130*  --   --  111* 155*  --  172*  BUN 76*   < > 69*  --  65*  --   --  66* 57*  --  56*  CREATININE 1.23   < > 1.02  --  1.01  --   --  1.06 0.97  --  0.89  CALCIUM 8.1*   < > 7.9*  --  7.8*  --   --  8.1* 8.1*  --  8.5*  MG 2.8*  --   --   --   --   --   --  2.7*  --   --   --   PHOS 2.3*  --   --   --   --   --   --  4.6  --   --   --    < > = values in this interval not displayed.   GFR: Estimated Creatinine Clearance: 124.4 mL/min (by C-G formula based on SCr of 0.89 mg/dL). Recent Labs  Lab 10/04/19 0200 10/05/19 0445 10/06/19 0413 10/07/19 0435  WBC 13.6* 15.4* 13.4* 12.9*    Liver Function Tests: Recent Labs  Lab 10/02/19 0430 10/04/19 0200 10/05/19 0445 10/06/19 0413 10/07/19 0435  AST 89* 40 37 34 30  ALT 255* 147* 122* 96* 73*  ALKPHOS 91 78 69 72 66  BILITOT 0.6 0.7 0.6 1.0 1.0  PROT 5.5* 5.4* 5.6* 5.8* 6.0*  ALBUMIN 2.3* 2.3* 2.5* 2.4* 2.5*   No results for input(s): LIPASE, AMYLASE in the last 168 hours. No results for input(s): AMMONIA in the last 168 hours.  ABG    Component Value Date/Time   PHART 7.498 (H) 10/07/2019 0337   PCO2ART 63.0 (H) 10/07/2019 0337   PO2ART 69.0 (L) 10/07/2019 0337   HCO3 48.1 (H) 10/07/2019 0337   TCO2 50 (H) 10/07/2019 0337   ACIDBASEDEF 7.0 (H) 09/26/2019 1136   O2SAT 92.0 10/07/2019 0337     Coagulation Profile: Recent Labs  Lab  10/02/19 0430  INR 1.2    Cardiac Enzymes: No results for input(s): CKTOTAL, CKMB, CKMBINDEX, TROPONINI in the last 168 hours.  HbA1C: Hgb A1c MFr Bld  Date/Time Value Ref Range Status  09/06/2019 11:40 AM 6.3 (H) 4.8 - 5.6 % Final    Comment:    (NOTE) Pre diabetes:          5.7%-6.4% Diabetes:              >6.4% Glycemic control for   <7.0% adults with diabetes     CBG: Recent Labs  Lab 10/06/19 1152 10/06/19 1606 10/06/19 1955 10/06/19 2350 10/07/19 0433  GLUCAP 143* 152* 190* 180* 170*    This patient is critically ill with multiple organ system failure; which, requires frequent high complexity decision making, assessment, support, evaluation, and titration of therapies. This was completed through the application of advanced monitoring technologies and extensive interpretation of multiple databases. During this encounter critical care time was devoted to patient care services described in this note for 35 minutes.  Garner Nash, DO [facility] 10/07/2019 7:45 AM

## 2019-10-07 NOTE — Progress Notes (Signed)
Pt with large cuff leak despite repositioning and air added to cuff. Dr. Tonia Brooms notified and plan to exchange ETT out. Dr. Tonia Brooms arrived to pt bedside, a boujie was inserted and the original 8.0  ETT was removed. A new 8.0 ETT at 26 at the lips was placed. Placement verified with direct visualization through the vocal chords with the glidescope and positive color change on the end tidal. CXR pending. Pt placed back on ventilator with good return volumes. RT will continue to monitor.

## 2019-10-07 NOTE — Progress Notes (Signed)
Pharmacy Antibiotic Note  Roy Lee is a 64 y.o. male admitted on 09/15/2019 with COVID-19.  Pharmacy has been consulted for meropenem dosing for sepsis fevers with Tm 102.2.  Plan: Meropenem 1g IV q8h Follow up renal function, culture results, and clinical course.   Height: _0  (180.3 cm) Weight: (!) 306 lb 14.1 oz (139.2 kg) IBW/kg (Calculated) : 75.3  Temp (24hrs), Avg:100.7 F (38.2 C), Min:99.3 F (37.4 C), Max:102 F (38.9 C)  Recent Labs  Lab 09/30/19 1330 09/30/19 1850 10/01/19 0300 10/03/19 0613 10/04/19 0200 10/05/19 0445 10/06/19 0413 10/07/19 0435  WBC  --   --    < > 12.7* 13.6* 15.4* 13.4* 12.9*  CREATININE  --   --    < > 1.02 1.01 1.06 0.97 0.89  VANCOTROUGH 9*  --   --   --   --   --   --   --   VANCOPEAK  --  20*  --   --   --   --   --   --    < > = values in this interval not displayed.    Estimated Creatinine Clearance: 124.4 mL/min (by C-G formula based on SCr of 0.89 mg/dL).    No Known Allergies  Antimicrobials this admission: 2/12 ceftriaxone >>2/17 2/12 azithromycin >>2/17 2/18 Cefepime >> 2/23 2/18 Vanc >> 2/23 2/27 Meropenem >>   Dose adjustments this admission: 2/10 VP 20, VT 9>>AUC 353  Microbiology results: 2/14 sputum: normal flora  2/12 bcx x2: negF  2/16 BAL >> negF  2/16 MRSA PCR neg  2/18 BCx >> ngtd 218 TA >> pending  2/18 UCx >> sent  2/27 BCx:  2/27 TA:   Thank you for allowing pharmacy to be a part of this patient's care.   Gretta Arab PharmD, BCPS Clinical pharmacist phone 7am- 5pm: [phone] 10/07/2019 10:32 AM

## 2019-10-07 NOTE — Procedures (Signed)
Intubation Procedure Note [name] [id number] 06-14-1956  Procedure: Intubation Indications: ETT needed to be changed  Procedure Details Consent: Unable to obtain consent because of emergent medical necessity. Time Out: Verified patient identification, verified procedure, site/side was marked, verified correct patient position, special equipment/implants available, medications/allergies/relevent history reviewed, required imaging and test results available.  Performed  Maximum sterile technique was used including cap, gloves, gown, hand hygiene and mask.  MAC video laryngoscope #3 Grade 1 view of the vocal cords. With insertion alongside original #8 ET tube the cuff was visible above the vocal cords. A blue bougie was inserted through the original 8 ET tube. Tube was exchanged and the patient was successfully reintubated. Tube confirmed with direct visualization and CO2 detector.  Evaluation Hemodynamic Status: BP stable throughout; O2 sats: transiently fell during during procedure Patient's Current Condition: stable Complications: No apparent complications Patient did tolerate procedure well. Chest X-ray ordered to verify placement.  CXR: pending.   Roy Lee 10/07/2019

## 2019-10-07 NOTE — Progress Notes (Signed)
eLink Physician-Brief Progress Note Patient Name: Roy Lee DOB: 1956/07/07 MRN: [id number]   Date of Service  10/07/2019  HPI/Events of Note  Temp 101.7  eICU Interventions  Blood cultures x 2        [name] U [name] 10/07/2019, 12:45 AM

## 2019-10-08 LAB — BASIC METABOLIC PANEL
Anion gap: 9 (ref 5–15)
BUN: 64 mg/dL — ABNORMAL HIGH (ref 8–23)
CO2: 43 mmol/L — ABNORMAL HIGH (ref 22–32)
Calcium: 7.6 mg/dL — ABNORMAL LOW (ref 8.9–10.3)
Chloride: 98 mmol/L (ref 98–111)
Creatinine, Ser: 0.92 mg/dL (ref 0.61–1.24)
GFR calc Af Amer: >60 mL/min (ref >60)
GFR calc non Af Amer: >60 mL/min (ref >60)
Glucose, Bld: 136 mg/dL — ABNORMAL HIGH (ref 70–99)
Potassium: 3.6 mmol/L (ref 3.5–5.1)
Sodium: 150 mmol/L — ABNORMAL HIGH (ref 135–145)

## 2019-10-08 LAB — GLUCOSE, CAPILLARY
Glucose-Capillary: 100 mg/dL — ABNORMAL HIGH (ref 70–99)
Glucose-Capillary: 130 mg/dL — ABNORMAL HIGH (ref 70–99)
Glucose-Capillary: 159 mg/dL — ABNORMAL HIGH (ref 70–99)
Glucose-Capillary: 129 mg/dL — ABNORMAL HIGH (ref 70–99)
Glucose-Capillary: 125 mg/dL — ABNORMAL HIGH (ref 70–99)

## 2019-10-08 LAB — MRSA PCR SCREENING: MRSA by PCR: NEGATIVE

## 2019-10-08 MED ORDER — VANCOMYCIN HCL 1250 MG/250ML IV SOLN
1250.0000 mg | Freq: Two times a day (BID) | INTRAVENOUS | Status: DC
  Filled 2019-10-08: qty 250

## 2019-10-08 MED ORDER — FUROSEMIDE 10 MG/ML IJ SOLN
40.0000 mg | Freq: Four times a day (QID) | INTRAMUSCULAR | Status: AC
  Administered 2019-10-08 – 2019-10-09 (×3): 40 mg via INTRAVENOUS
  Filled 2019-10-08 (×3): qty 4

## 2019-10-08 MED ORDER — METOLAZONE 5 MG PO TABS
5.0000 mg | ORAL_TABLET | Freq: Once | ORAL | Status: AC
  Administered 2019-10-08: 11:00:00 5 mg via ORAL
  Filled 2019-10-08: qty 1

## 2019-10-08 MED ORDER — POTASSIUM CHLORIDE 20 MEQ/15ML (10%) PO SOLN
40.0000 meq | Freq: Two times a day (BID) | ORAL | Status: AC
  Administered 2019-10-08 (×2): 40 meq via ORAL
  Filled 2019-10-08 (×2): qty 30

## 2019-10-08 MED ORDER — VANCOMYCIN HCL 2000 MG/400ML IV SOLN
2000.0000 mg | Freq: Once | INTRAVENOUS | Status: AC
  Administered 2019-10-08: 12:00:00 2000 mg via INTRAVENOUS
  Filled 2019-10-08: qty 400

## 2019-10-08 MED ORDER — DEXTROSE 50 % IV SOLN
INTRAVENOUS | Status: AC
  Administered 2019-10-09: 50 mL via INTRAVENOUS
  Filled 2019-10-08: qty 50

## 2019-10-08 MED ORDER — POLYETHYLENE GLYCOL 3350 17 G PO PACK
17.0000 g | PACK | Freq: Two times a day (BID) | ORAL | Status: DC
  Administered 2019-10-08 – 2019-10-13 (×7): 17 g
  Filled 2019-10-08 (×7): qty 1

## 2019-10-08 NOTE — Progress Notes (Signed)
ANTICOAGULATION CONSULT NOTE - Follow Up Consult  Pharmacy Consult for Apixaban Indication: Hx PE (apixaban PTA), Afib  No Known Allergies  Patient Measurements: Height: 5\' 11"  (180.3 cm) Weight: (!) 306 lb 14.1 oz (139.2 kg) IBW/kg (Calculated) : 75.3 Heparin Dosing Weight: 108 kg  Vital Signs: Temp: 102.4 F (39.1 C) (02/28 0830) Temp Source: Axillary (02/28 0829) BP: 109/72 (02/28 0815) Pulse Rate: 72 (02/28 0830)  Labs: Recent Labs    10/06/19 0413 10/06/19 0413 10/07/19 0337 10/07/19 0337 10/07/19 0435 10/07/19 1953  HGB 10.0*   < > 10.5*   < > 10.4* 10.9*  HCT 34.0*   < > 31.0*  --  34.2* 32.0*  PLT 181  --   --   --  233  --   CREATININE 0.97  --   --   --  0.89  --    < > = values in this interval not displayed.    Estimated Creatinine Clearance: 124.4 mL/min (by C-G formula based on SCr of 0.89 mg/dL).   Medications: Infusions:  . dexmedetomidine (PRECEDEX) IV infusion 1.2 mcg/kg/hr (10/08/19 0700)  . feeding supplement (VITAL AF 1.2 CAL) 1,000 mL (10/08/19 0546)  . HYDROmorphone 4 mg/hr (10/08/19 0700)  . meropenem (MERREM) IV Stopped (10/08/19 0311)  . norepinephrine (LEVOPHED) Adult infusion 2 mcg/min (10/08/19 0700)    Assessment: 54 YOM with COVID initially continued on home apixaban for a h/o PE, then transitioned to Heparin IV.  Heparin was held on 2/21 for rectal bleeding, but resolved on 2/21 and heparin restarted.  Pharmacy was consulted to transition from IV heparin back to apixaban.  No further s/s bleeding reported.  SCr and CBC stable.    Goal of Therapy:  Monitor platelets by anticoagulation protocol: Yes   Plan:  Continue apixaban 5 mg PO BID. Pharmacy will sign off notes, but will continue to follow peripherally.  3/21 PharmD, BCPS Clinical pharmacist phone 7am- 5pm: [phone] 10/08/2019 8:30 AM

## 2019-10-08 NOTE — Progress Notes (Signed)
Pharmacy Antibiotic Note  Roy Lee is a 64 y.o. male admitted on 09/27/2019 with COVID-19.  Pharmacy has been consulted for meropenem dosing for sepsis and fevers.  Respiratory cultures with GPC and GNR - add vancomycin per pharmacy and check MRSA PCR.  Tm 102.2, SCr stable at 0.92 but high risk for renal injury given aggressive diuresis and vancomycin.  Plan: Continue Meropenem 1g IV q8h Vancomycin 2g IV x1 followed by 1274m IV q12h Measure Vanc peak and trough at steady state.  Goal AUC = 400 - 550. Expected AUC 455 using SCr 0.92 and Vd 0.5 Follow up renal function, culture results, and clinical course.   Height: _0  (180.3 cm) Weight: (!) 306 lb 14.1 oz (139.2 kg) IBW/kg (Calculated) : 75.3  Temp (24hrs), Avg:101.8 F (38.8 C), Min:101 F (38.3 C), Max:102.4 F (39.1 C)  Recent Labs  Lab 10/03/19 0613 10/03/19 0613 10/04/19 0200 10/05/19 0445 10/06/19 0413 10/07/19 0435 10/08/19 0754  WBC 12.7*  --  13.6* 15.4* 13.4* 12.9*  --   CREATININE 1.02   < > 1.01 1.06 0.97 0.89 0.92   < > = values in this interval not displayed.    Estimated Creatinine Clearance: 120.3 mL/min (by C-G formula based on SCr of 0.92 mg/dL).    No Known Allergies  Antimicrobials this admission: 2/12 ceftriaxone >>2/17 2/12 azithromycin >>2/17 2/18 Cefepime >> 2/23 2/18 Vanc >> 2/23, resumed 2/28 >>  2/27 Meropenem >>   Dose adjustments this admission: 2/10 VP 20, VT 9>>AUC 353  Microbiology results: 2/14 sputum: normal flora  2/12 bcx x2: negF  2/16 BAL >> negF  2/16 MRSA PCR neg  2/18 BCx >> ngtd 218 TA >> pending  2/18 UCx >> sent  2/27 BCx: ngtd 2/27 TA: few GPC, rare GNR 2/28 MRSA PCR:    Thank you for allowing pharmacy to be a part of this patient's care.  CGretta ArabPharmD, BCPS Clinical pharmacist phone 7am- 5pm: [phone]/28/2021 10:32 AM

## 2019-10-08 NOTE — Progress Notes (Signed)
Patient's daughters updated via phone at number listed in pt's chart. All questions and concerns answered at this time. Pt's daughters requesting to video chat with pt around 2100 tonight, will relay message to night shift RN. Will continue to monitor.

## 2019-10-08 NOTE — Progress Notes (Signed)
NAME:  Roy Lee, MRN:  [id number], DOB:  [birth year], LOS: 41 ADMISSION DATE:  10/07/2019, CONSULTATION DATE:  2/16 REFERRING MD:  Aileen Fass, CHIEF COMPLAINT:  Dyspnea   Brief History   64y/o male treated for COVID 19 pneumonia and PE in January, treated with actemra, steroids, antivirals and dc'd home on 3-5 L O2 and anticoagulation.  Returned to the ED 2/12 with worsening dyspnea and hypoxemia.  Moved to the ICU on 2/16 in the setting of worsening hypoxemia and atrial fibrillation with RVR.  Past Medical History  Obesity Hypertension arthitis  Significant Hospital Events   1/24 original admission, treated with actemra, steroids, antivirals 1/30 d/c home on 3-5 L O2 2/12 return to ED, admission, treated with ceftriaxone/azithro 2/16 move to ICU, intubation, bronchoscopy, prone, afib with rvr started dilt 2/18 fever, started antibiotics 2/21 early AM bright red blood per rectum, vent dyssynchrony Plateau pressure 34, afib with rvr started on amiodarone, nearly off vasopressors, held prone because oxygeantion better 2/23 oxygenation better  Consults:  PCCM  Procedures:  2/16 ETT >  2/16 L IJ CVL > 2/24 2/24 PICC>   Significant Diagnostic Tests:  2/12 CT angiogram chest > no PE, bilateral infiltrates, nodular, progressed compared to 2/6 film 2/16 CT chest> significant progression of bilateral ground glass and consolidation compared to 2/12 film 2/17 Echo> RV dilation and RV pressure overload, LVEF normal   Micro Data:  2/12 blood > negative 2/14 resp culture > GPC in pair, cluster, GPR, GNR 2/16 BAL > ngtd 2/16 BAL fungal > ngtd 2/16 BAL AFB > ngtd  2/18 blood > neg 2/18 resp culture > neg  Antimicrobials:  2/12  Ceftriaxone > 2/16  2/12  Azithro > 2/16   2/18 cefepime > 2/22 2/18 vanc > 2/22  Interim history/subjective:   Additional fevers overnight.  T-max this morning 101.8 remains intubated on mechanical life support  Objective   Blood pressure  120/80, pulse 82, temperature (!) 101.8 F (38.8 C), resp. rate (!) 37, height _0  (1.803 m), weight (!) 139.2 kg, SpO2 97 %.    Vent Mode: PCV FiO2 (%):  [80 %-100 %] 100 % Set Rate:  [28 bmp] 28 bmp PEEP:  [10 cmH20-14 cmH20] 14 cmH20 Plateau Pressure:  [25 cmH20-35 cmH20] 25 cmH20   Intake/Output Summary (Last 24 hours) at 10/08/2019 0818 Last data filed at 10/08/2019 0700 Gross per 24 hour  Intake 4654.13 ml  Output 3230 ml  Net 1424.13 ml   Filed Weights   10/05/19 0458 10/06/19 0500 10/07/19 0500  Weight: (!) 144 kg (!) 137.6 kg (!) 139.2 kg    Examination:  General: Patient remains critically ill intubated on mechanical life support HENT: NCAT, endotracheal tube in place PULM: Bilateral ventilated breath sounds CV: Tachycardic, regular, S1-S2 GI: Soft, morbidly obese nontender MSK: Upper and lower extremity dependent edema Neuro: Sedated on mechanical support   10/07/2019: Chest x-ray, bilateral low volumes significant opacities present. The patient's images have been independently reviewed by me.    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, late phase disease Plateau 34 on 2/26 Acute pulmonary edema New Sepsis, not present on admission, elevated white count, fevers likely HCAP Continue mechanical ventilation per ARDS protocol  Target TVol 6-8cc/kgIBW Target Plateau Pressure < 30cm H20 Target driving pressure less  than 15 cm of water Target PaO2 55-65: titrate PEEP/FiO2 per protocol As long as PaO2 to FiO2 ratio is less than 1:150 position in prone position for 16 hours a day Check CVP daily if CVL in place Target CVP less than 4, diurese as necessary Ventilator associated pneumonia prevention protocol 10/08/2019: still with fevers, meropenem started yesterday.  Plans for one dose of vancomycin. Await speciation of GPCs.Discussed with pharmacy.  Continue diureses today with lasix 57m X3 doses and metolazone 52mx1.    Atrial fibrillation with RVR > required amiodarone, doing well on that Continue amiodarone Telemetry  PE Eliquis continued, follow H&H  AKI> improving Azotemia Positive cumulative fluid balance Follow UOP and continue diuresis today  Replaced K to stay ahead   Shock liver>continued improvement Follow LFTs  CMP in the AM   Hyperglycemia with prediabetes Insulin + SSI   Need for sedation for mechanical ventilation Some vent dyssynchrony PAD protocol  Avoid continuous benzos if we can  On dilaudid and prn versed   Goals of care: [place] with intent for full recovery, this would include a tracheostomy. However they don't want him to go through CPR in the event of a cardiac arrest or go on dialysis.     Best practice:  Diet: tube feeding Pain/Anxiety/Delirium protocol (if indicated): yes, RASS target -1 to -2, as above VAP protocol (if indicated): yes DVT prophylaxis: eliquis GI prophylaxis: Pantoprazole for stress ulcer prophylaxis Glucose control: SSI Mobility: bed rest Code Status: full Family Communication: daughters updated by phone on 2/25 Disposition: remain in ICU  Labs   CBC: Recent Labs  Lab 10/03/19 0613 10/03/19 0613 10/04/19 0200 10/04/19 1612 10/05/19 0445 10/06/19 0413 10/07/19 0337 10/07/19 0435 10/07/19 1953  WBC 12.7*  --  13.6*  --  15.4* 13.4*  --  12.9*  --   NEUTROABS 10.0*  --  10.5*  --  11.9* 11.1*  --  10.4*  --   HGB 10.4*   < > 10.1*   < > 10.7* 10.0* 10.5* 10.4* 10.9*  HCT 32.9*   < > 32.4*   < > 35.4* 34.0* 31.0* 34.2* 32.0*  MCV 95.1  --  97.3  --  99.7 100.9*  --  96.9  --   PLT 164  --  151  --  189 181  --  233  --    < > = values in this interval not displayed.    Basic Metabolic Panel: Recent Labs  Lab  10/02/19 0430 10/02/19 0430 10/03/19 [id number]/23/21 [id number]/24/21 0200 10/04/19 1612 10/05/19 0445 10/06/19 0413 10/07/19 0337 10/07/19 0435 10/07/19 1953  NA 139   < > 139   < > 142   < > 146* 147* 144 149* 146*  K 4.3   < > 4.1   < > 3.4*   < > 4.3 4.3 4.0 4.3 3.8  CL 96*   < > 95*  --  95*  --  97* 96*  --  95*  --   CO2 35*   < > 36*  --  39*  --  39* 45*  --  43*  --   GLUCOSE 191*   < > 179*  --  130*  --  111* 155*  --  172*  --   BUN 76*   < > 69*  --  65*  --  66* 57*  --  56*  --   CREATININE 1.23   < >  1.02  --  1.01  --  1.06 0.97  --  0.89  --   CALCIUM 8.1*   < > 7.9*  --  7.8*  --  8.1* 8.1*  --  8.5*  --   MG 2.8*  --   --   --   --   --  2.7*  --   --   --   --   PHOS 2.3*  --   --   --   --   --  4.6  --   --   --   --    < > = values in this interval not displayed.   GFR: Estimated Creatinine Clearance: 124.4 mL/min (by C-G formula based on SCr of 0.89 mg/dL). Recent Labs  Lab 10/04/19 0200 10/05/19 0445 10/06/19 0413 10/07/19 0435  WBC 13.6* 15.4* 13.4* 12.9*    Liver Function Tests: Recent Labs  Lab 10/02/19 0430 10/04/19 0200 10/05/19 0445 10/06/19 0413 10/07/19 0435  AST 89* 40 37 34 30  ALT 255* 147* 122* 96* 73*  ALKPHOS 91 78 69 72 66  BILITOT 0.6 0.7 0.6 1.0 1.0  PROT 5.5* 5.4* 5.6* 5.8* 6.0*  ALBUMIN 2.3* 2.3* 2.5* 2.4* 2.5*   No results for input(s): LIPASE, AMYLASE in the last 168 hours. No results for input(s): AMMONIA in the last 168 hours.  ABG    Component Value Date/Time   PHART 7.501 (H) 10/07/2019 1953   PCO2ART 62.6 (H) 10/07/2019 1953   PO2ART 57.0 (L) 10/07/2019 1953   HCO3 49.1 (H) 10/07/2019 1953   TCO2 >50 (H) 10/07/2019 1953   ACIDBASEDEF 7.0 (H) 09/26/2019 1136   O2SAT 91.0 10/07/2019 1953     Coagulation Profile: Recent Labs  Lab 10/02/19 0430  INR 1.2    Cardiac Enzymes: No results for input(s): CKTOTAL, CKMB, CKMBINDEX, TROPONINI in the last 168 hours.  HbA1C: Hgb A1c MFr Bld  Date/Time Value  Ref Range Status  09/06/2019 11:40 AM 6.3 (H) 4.8 - 5.6 % Final    Comment:    (NOTE) Pre diabetes:          5.7%-6.4% Diabetes:              >6.4% Glycemic control for   <7.0% adults with diabetes     CBG: Recent Labs  Lab 10/07/19 1611 10/07/19 2043 10/07/19 2324 10/08/19 0422 10/08/19 0758  GLUCAP 128* 135* 170* 100* 130*    This patient is critically ill with multiple organ system failure; which, requires frequent high complexity decision making, assessment, support, evaluation, and titration of therapies. This was completed through the application of advanced monitoring technologies and extensive interpretation of multiple databases. During this encounter critical care time was devoted to patient care services described in this note for 32 minutes.  Garner Nash, DO [facility] 10/08/2019 8:18 AM

## 2019-10-09 ENCOUNTER — Inpatient Hospital Stay (HOSPITAL_COMMUNITY): Payer: 59

## 2019-10-09 DIAGNOSIS — A419 Sepsis, unspecified organism: Secondary | ICD-10-CM

## 2019-10-09 DIAGNOSIS — R001 Bradycardia, unspecified: Secondary | ICD-10-CM

## 2019-10-09 DIAGNOSIS — T68XXXA Hypothermia, initial encounter: Secondary | ICD-10-CM

## 2019-10-09 DIAGNOSIS — R6521 Severe sepsis with septic shock: Secondary | ICD-10-CM

## 2019-10-09 LAB — CBC
HCT: 34.3 % — ABNORMAL LOW (ref 39.0–52.0)
Hemoglobin: 10.2 g/dL — ABNORMAL LOW (ref 13.0–17.0)
MCH: 29.3 pg (ref 26.0–34.0)
MCHC: 29.7 g/dL — ABNORMAL LOW (ref 30.0–36.0)
MCV: 98.6 fL (ref 80.0–100.0)
Platelets: 252 10*3/uL (ref 150–400)
RBC: 3.48 MIL/uL — ABNORMAL LOW (ref 4.22–5.81)
RDW: 17.5 % — ABNORMAL HIGH (ref 11.5–15.5)
WBC: 13.9 10*3/uL — ABNORMAL HIGH (ref 4.0–10.5)
nRBC: 0 % (ref 0.0–0.2)

## 2019-10-09 LAB — COMPREHENSIVE METABOLIC PANEL
ALT: 41 U/L (ref 0–44)
AST: 27 U/L (ref 15–41)
Albumin: 2.2 g/dL — ABNORMAL LOW (ref 3.5–5.0)
Alkaline Phosphatase: 56 U/L (ref 38–126)
Anion gap: 11 (ref 5–15)
BUN: 68 mg/dL — ABNORMAL HIGH (ref 8–23)
CO2: 41 mmol/L — ABNORMAL HIGH (ref 22–32)
Calcium: 7.5 mg/dL — ABNORMAL LOW (ref 8.9–10.3)
Chloride: 99 mmol/L (ref 98–111)
Creatinine, Ser: 0.92 mg/dL (ref 0.61–1.24)
GFR calc Af Amer: >60 mL/min (ref >60)
GFR calc non Af Amer: >60 mL/min (ref >60)
Glucose, Bld: 135 mg/dL — ABNORMAL HIGH (ref 70–99)
Potassium: 3.5 mmol/L (ref 3.5–5.1)
Sodium: 151 mmol/L — ABNORMAL HIGH (ref 135–145)
Total Bilirubin: 0.8 mg/dL (ref 0.3–1.2)
Total Protein: 5.7 g/dL — ABNORMAL LOW (ref 6.5–8.1)

## 2019-10-09 LAB — CULTURE, RESPIRATORY W GRAM STAIN: Culture: NORMAL

## 2019-10-09 LAB — POCT I-STAT 7, (LYTES, BLD GAS, ICA,H+H)
Calcium, Ion: 1.15 mmol/L (ref 1.15–1.40)
HCT: 36 % — ABNORMAL LOW (ref 39.0–52.0)
Hemoglobin: 12.2 g/dL — ABNORMAL LOW (ref 13.0–17.0)
Patient temperature: 37.8
Potassium: 3.9 mmol/L (ref 3.5–5.1)
Sodium: 147 mmol/L — ABNORMAL HIGH (ref 135–145)
pCO2 arterial: >97 mmHg (ref 32.0–48.0)
pH, Arterial: 7.127 — CL (ref 7.350–7.450)
pO2, Arterial: 100 mmHg (ref 83.0–108.0)

## 2019-10-09 LAB — GLUCOSE, CAPILLARY
Glucose-Capillary: 109 mg/dL — ABNORMAL HIGH (ref 70–99)
Glucose-Capillary: 123 mg/dL — ABNORMAL HIGH (ref 70–99)
Glucose-Capillary: 130 mg/dL — ABNORMAL HIGH (ref 70–99)
Glucose-Capillary: 139 mg/dL — ABNORMAL HIGH (ref 70–99)
Glucose-Capillary: 143 mg/dL — ABNORMAL HIGH (ref 70–99)
Glucose-Capillary: 154 mg/dL — ABNORMAL HIGH (ref 70–99)
Glucose-Capillary: 161 mg/dL — ABNORMAL HIGH (ref 70–99)
Glucose-Capillary: 99 mg/dL (ref 70–99)

## 2019-10-09 MED ORDER — POTASSIUM CHLORIDE 20 MEQ/15ML (10%) PO SOLN
40.0000 meq | Freq: Once | ORAL | Status: AC
  Administered 2019-10-09: 40 meq
  Filled 2019-10-09: qty 30

## 2019-10-09 MED ORDER — INFLUENZA VAC SPLIT QUAD 0.5 ML IM SUSY
0.5000 mL | PREFILLED_SYRINGE | INTRAMUSCULAR | Status: DC
  Filled 2019-10-09: qty 0.5

## 2019-10-09 MED ORDER — MIDAZOLAM HCL 2 MG/2ML IJ SOLN: 2.0000 mg | INTRAMUSCULAR | Status: DC | PRN

## 2019-10-09 MED ORDER — SODIUM CHLORIDE 0.9 % IV SOLN: INTRAVENOUS | Status: DC | PRN

## 2019-10-09 MED ORDER — DEXMEDETOMIDINE HCL IN NACL 400 MCG/100ML IV SOLN
0.4000 ug/kg/h | INTRAVENOUS | Status: DC
  Administered 2019-10-09: 1.1 ug/kg/h via INTRAVENOUS
  Filled 2019-10-09: qty 100

## 2019-10-09 MED ORDER — STERILE WATER FOR INJECTION IV SOLN
INTRAVENOUS | Status: DC
  Filled 2019-10-09 (×2): qty 850

## 2019-10-09 MED ORDER — DEXTROSE 50 % IV SOLN: 50.0000 mL | Freq: Once | INTRAVENOUS | Status: AC

## 2019-10-09 MED ORDER — MIDAZOLAM BOLUS VIA INFUSION
1.0000 mg | INTRAVENOUS | Status: DC | PRN
  Administered 2019-10-11: 2 mg via INTRAVENOUS
  Administered 2019-10-12 – 2019-10-13 (×3): 1 mg via INTRAVENOUS
  Filled 2019-10-09: qty 2

## 2019-10-09 MED ORDER — MIDAZOLAM 50MG/50ML (1MG/ML) PREMIX INFUSION
0.0000 mg/h | INTRAVENOUS | Status: DC
  Administered 2019-10-09: 8 mg/h via INTRAVENOUS
  Administered 2019-10-09: 4 mg/h via INTRAVENOUS
  Administered 2019-10-10 (×2): 8 mg/h via INTRAVENOUS
  Administered 2019-10-10 – 2019-10-11 (×3): 6 mg/h via INTRAVENOUS
  Administered 2019-10-11: 5 mg/h via INTRAVENOUS
  Administered 2019-10-12 (×2): 6 mg/h via INTRAVENOUS
  Administered 2019-10-13: 7 mg/h via INTRAVENOUS
  Administered 2019-10-13: 10 mg/h via INTRAVENOUS
  Administered 2019-10-13: 7 mg/h via INTRAVENOUS
  Administered 2019-10-13: 10 mg/h via INTRAVENOUS
  Filled 2019-10-09 (×16): qty 50

## 2019-10-09 MED ORDER — FUROSEMIDE 10 MG/ML IJ SOLN
40.0000 mg | Freq: Two times a day (BID) | INTRAMUSCULAR | Status: DC
  Administered 2019-10-09 – 2019-10-11 (×6): 40 mg via INTRAVENOUS
  Filled 2019-10-09 (×6): qty 4

## 2019-10-09 NOTE — Progress Notes (Signed)
Emergency contact Caryn Bee did Facetime video chat with patient tonight.

## 2019-10-09 NOTE — Progress Notes (Signed)
NAME:  Roy Lee, MRN:  [id number], DOB:  25-Jun-1956, LOS: 69 ADMISSION DATE:  09/20/2019, CONSULTATION DATE:  2/16 REFERRING MD:  Aileen Fass, CHIEF COMPLAINT:  Dyspnea   Brief History   64y/o male treated for COVID 19 pneumonia and PE in January, treated with actemra, steroids, antivirals and dc'd home on 3-5 L O2 and anticoagulation.  Returned to the ED 2/12 with worsening dyspnea and hypoxemia.  Moved to the ICU on 2/16 in the setting of worsening hypoxemia and atrial fibrillation with RVR.  Past Medical History  Obesity Hypertension arthitis  Significant Hospital Events   1/24 original admission, treated with actemra, steroids, antivirals 1/30 d/c home on 3-5 L O2 2/12 return to ED, admission, treated with ceftriaxone/azithro 2/16 move to ICU, intubation, bronchoscopy, prone, afib with rvr started dilt 2/18 fever, started antibiotics 2/21 early AM bright red blood per rectum, vent dyssynchrony Plateau pressure 34, afib with rvr started on amiodarone, nearly off vasopressors, held prone because oxygeantion better 2/23 oxygenation better 2/28 febrile 101.8  Consults:  PCCM  Procedures:  2/16 ETT >  2/16 L IJ CVL > 2/24 2/24 PICC>   Significant Diagnostic Tests:  2/12 CT angiogram chest > no PE, bilateral infiltrates, nodular, progressed compared to 2/6 film 2/16 CT chest> significant progression of bilateral ground glass and consolidation compared to 2/12 film 2/17 Echo> RV dilation and RV pressure overload, LVEF normal   Micro Data:  2/12 blood > negative 2/14 resp culture > GPC in pair, cluster, GPR, GNR >> nml flora 2/16 BAL > ngtd 2/16 BAL fungal > ngtd 2/16 BAL AFB > ngtd  2/18 blood > neg 2/18 resp culture > neg  2/27 blood >> ng 2/27 resp >> ng  Antimicrobials:  2/12  Ceftriaxone > 2/16  2/12  Azithro > 2/16   2/18 cefepime > 2/22 2/18 vanc > 2/22  2/27 meropenem >> 2/28 vanc  Interim history/subjective:  Febrile last 24 hours , then?   Hypothermic Slight increased dose of Levophed Critically ill, intubated Sedated on Precedex and Dilaudid with bradycardia issues overnight   Objective   Blood pressure (!) 87/60, pulse 74, temperature (!) 93 F (33.9 C), resp. rate (!) 36, height _0  (1.803 m), weight (!) 141.2 kg, SpO2 (!) 84 %.    Vent Mode: PCV FiO2 (%):  [60 %-90 %] 70 % Set Rate:  [28 bmp] 28 bmp PEEP:  [12 cmH20-14 cmH20] 14 cmH20 Plateau Pressure:  [27 cmH20-41 cmH20] 41 cmH20   Intake/Output Summary (Last 24 hours) at 10/09/2019 1107 Last data filed at 10/09/2019 0645 Gross per 24 hour  Intake 4281.07 ml  Output 3975 ml  Net 306.07 ml   Filed Weights   10/06/19 0500 10/07/19 0500 10/09/19 0233  Weight: (!) 137.6 kg (!) 139.2 kg (!) 141.2 kg    Examination:  General: Middle-aged, well-built man, supine intubated on ventilator HENT: NCAT, oral ETT PULM: Bilateral ventilated breath sounds CV: Tachycardic, regular, S1-S2 GI: Soft, morbidly obese nontender MSK: Upper and lower extremity dependent edema Neuro: Sedated, RASS -3   3/1 chest x-ray personally reviewed -bilateral patchy infiltrates without much change from prior  Labs show mild hypernatremia, stable leukocytosis and anemia  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, late phase disease, initial infection 1/24 Acute pulmonary edema Continue lung  protective ventilation, on pressure control 28, which derives a tidal volume of 550 to 600 cc, 6 cc equals 450 cc and 8 cc equals 600 cc for him, so we will attempt to drop pressure control to decrease driving pressure while maintaining near normal pH  On 70%/PEEP of 14, may have to prone if unable to improve PaO2/FiO2 ratio greater than 150  Ventilator associated pneumonia prevention  protocol   Septic shock , not present on admission  - likely HCAP .-Continue meropenem even though  cultures negative   Atrial fibrillation with RVR > required amiodarone, now back in sinus rhythm dc amiodarone Telemetry  PE Eliquis continued, follow H&H  AKI> improving Azotemia Positive cumulative fluid balance 20 L -Lasix 40 every 12   Shock liver>continued improvement Follow LFTs    Hyperglycemia with prediabetes Lantus 12 units every 12  SSI   Need for sedation for mechanical ventilation Some vent dyssynchrony PAD protocol , goal RASS -2 Precedex is not working here >> change to IV Versed drip On dilaudid gtt  Goals of care: Good conversation with daughter Benjamine Mola, she is hopeful for recovery but he would not want to be in a nursing home with tracheostomy.  Certainly no dialysis or CPR. We will offer tracheostomy only if his oxygen requirements improve and he has significant chances of good quality recovery.  If he has more organ dysfunction then they would be willing to transition to comfort care   Best practice:  Diet: tube feeding Pain/Anxiety/Delirium protocol (if indicated): RASS target  -2, VAP protocol (if indicated): yes DVT prophylaxis: eliquis GI prophylaxis: Pantoprazole Glucose control: SSI Mobility: bed rest Code Status: full Family Communication: daughter elisabeth Disposition:  ICU  The patient is critically ill with multiple organ systems failure and requires high complexity decision making for assessment and support, frequent evaluation and titration of therapies, application of advanced monitoring technologies and extensive interpretation of multiple databases. Critical Care Time devoted to patient care services described in this note independent of APP/resident  time is 35 minutes.    Kara Mead MD. [facility]. [facility]  If no response to pager , please call [phone]   10/09/2019

## 2019-10-09 NOTE — Progress Notes (Signed)
Cooling blanket on patient due to t max today of 103.3. Temp down to 96. Warming mode set to rewarm patient.

## 2019-10-09 NOTE — Progress Notes (Signed)
Called regarding persistent bradycardia. Likely secondary to hypothermia. Patient is being warmed, will also change sedation from Precedex to intermittent Versed pushes for now.

## 2019-10-09 NOTE — Progress Notes (Signed)
CBG at 0000 checked 44. Recheck from different vial of strips was 39. D50 amp given per hypoglycemia protocol. Recheck was 99.

## 2019-10-09 NOTE — Progress Notes (Signed)
eLink Physician-Brief Progress Note Patient Name: DARSHAWN BOATENG DOB: 1956/01/12 MRN: [id number]   Date of Service  10/09/2019  HPI/Events of Note  Notified that Precedex was discontinued due to bradycardia. However patient requires this in addition to the Versed pushes and the Dilaudid drip to maintain vent synchrony.  eICU Interventions   BP stable despite relative bradycardia.  Will resume Precedex drip but discussed with bedside RN to titrate down those as HR and BP permits     Intervention Category Major Interventions: Arrhythmia - evaluation and management  Darl Pikes 10/09/2019, 5:09 AM

## 2019-10-09 NOTE — Progress Notes (Signed)
Vent synchrony achieved with Versed drip instead of Precedex ABG showed acute respiratory acidosis Change to PRVC/6 cc, peak pressure remains high at 43 on PEEP of 12 Repeat ABG shows persistent acute respiratory acidosis-we will add bicarbonate drip and see if we can achieve pH 7.20 and above with permissive hypercarbia Further increase in tidal volume limited by plateau pressure  Lorenza Winkleman V. Vassie Loll MD

## 2019-10-09 DEATH — deceased

## 2019-10-10 ENCOUNTER — Inpatient Hospital Stay (HOSPITAL_COMMUNITY): Payer: 59

## 2019-10-10 LAB — BASIC METABOLIC PANEL
Anion gap: 11 (ref 5–15)
BUN: 75 mg/dL — ABNORMAL HIGH (ref 8–23)
CO2: 44 mmol/L — ABNORMAL HIGH (ref 22–32)
Calcium: 7.9 mg/dL — ABNORMAL LOW (ref 8.9–10.3)
Chloride: 93 mmol/L — ABNORMAL LOW (ref 98–111)
Creatinine, Ser: 1.13 mg/dL (ref 0.61–1.24)
GFR calc Af Amer: >60 mL/min (ref >60)
GFR calc non Af Amer: >60 mL/min (ref >60)
Glucose, Bld: 102 mg/dL — ABNORMAL HIGH (ref 70–99)
Potassium: 4.3 mmol/L (ref 3.5–5.1)
Sodium: 148 mmol/L — ABNORMAL HIGH (ref 135–145)

## 2019-10-10 LAB — CBC
HCT: 35.2 % — ABNORMAL LOW (ref 39.0–52.0)
Hemoglobin: 10.2 g/dL — ABNORMAL LOW (ref 13.0–17.0)
MCH: 29.5 pg (ref 26.0–34.0)
MCHC: 29 g/dL — ABNORMAL LOW (ref 30.0–36.0)
MCV: 101.7 fL — ABNORMAL HIGH (ref 80.0–100.0)
Platelets: 218 10*3/uL (ref 150–400)
RBC: 3.46 MIL/uL — ABNORMAL LOW (ref 4.22–5.81)
RDW: 17.2 % — ABNORMAL HIGH (ref 11.5–15.5)
WBC: 10.5 10*3/uL (ref 4.0–10.5)
nRBC: 0.2 % (ref 0.0–0.2)

## 2019-10-10 LAB — POCT I-STAT 7, (LYTES, BLD GAS, ICA,H+H)
Calcium, Ion: 1.13 mmol/L — ABNORMAL LOW (ref 1.15–1.40)
Calcium, Ion: 1.11 mmol/L — ABNORMAL LOW (ref 1.15–1.40)
HCT: 36 % — ABNORMAL LOW (ref 39.0–52.0)
HCT: 33 % — ABNORMAL LOW (ref 39.0–52.0)
Hemoglobin: 12.2 g/dL — ABNORMAL LOW (ref 13.0–17.0)
Hemoglobin: 11.2 g/dL — ABNORMAL LOW (ref 13.0–17.0)
Patient temperature: 37
Patient temperature: 36.7
Potassium: 4.1 mmol/L (ref 3.5–5.1)
Potassium: 4.1 mmol/L (ref 3.5–5.1)
Sodium: 148 mmol/L — ABNORMAL HIGH (ref 135–145)
Sodium: 146 mmol/L — ABNORMAL HIGH (ref 135–145)
pCO2 arterial: >97 mmHg (ref 32.0–48.0)
pCO2 arterial: >97 mmHg (ref 32.0–48.0)
pH, Arterial: 7.176 — CL (ref 7.350–7.450)
pH, Arterial: 7.306 — ABNORMAL LOW (ref 7.350–7.450)
pO2, Arterial: 90 mmHg (ref 83.0–108.0)
pO2, Arterial: 77 mmHg — ABNORMAL LOW (ref 83.0–108.0)

## 2019-10-10 LAB — GLUCOSE, CAPILLARY
Glucose-Capillary: 110 mg/dL — ABNORMAL HIGH (ref 70–99)
Glucose-Capillary: 105 mg/dL — ABNORMAL HIGH (ref 70–99)
Glucose-Capillary: 152 mg/dL — ABNORMAL HIGH (ref 70–99)
Glucose-Capillary: 169 mg/dL — ABNORMAL HIGH (ref 70–99)
Glucose-Capillary: 187 mg/dL — ABNORMAL HIGH (ref 70–99)
Glucose-Capillary: 185 mg/dL — ABNORMAL HIGH (ref 70–99)
Glucose-Capillary: 185 mg/dL — ABNORMAL HIGH (ref 70–99)

## 2019-10-10 LAB — SEDIMENTATION RATE: Sed Rate: 68 mm/hr — ABNORMAL HIGH (ref 0–16)

## 2019-10-10 LAB — PHOSPHORUS: Phosphorus: 5.1 mg/dL — ABNORMAL HIGH (ref 2.5–4.6)

## 2019-10-10 LAB — C-REACTIVE PROTEIN: CRP: 18.4 mg/dL — ABNORMAL HIGH (ref <1.0)

## 2019-10-10 LAB — MAGNESIUM: Magnesium: 2.4 mg/dL (ref 1.7–2.4)

## 2019-10-10 MED ORDER — AMIODARONE HCL IN DEXTROSE 360-4.14 MG/200ML-% IV SOLN
30.0000 mg/h | INTRAVENOUS | Status: DC
  Administered 2019-10-10 – 2019-10-13 (×7): 30 mg/h via INTRAVENOUS
  Filled 2019-10-10 (×6): qty 200

## 2019-10-10 MED ORDER — AMIODARONE HCL IN DEXTROSE 360-4.14 MG/200ML-% IV SOLN
60.0000 mg/h | INTRAVENOUS | Status: AC
  Administered 2019-10-10 (×2): 60 mg/h via INTRAVENOUS
  Filled 2019-10-10 (×3): qty 200

## 2019-10-10 MED ORDER — AMIODARONE LOAD VIA INFUSION
150.0000 mg | Freq: Once | INTRAVENOUS | Status: AC
  Administered 2019-10-10: 150 mg via INTRAVENOUS
  Filled 2019-10-10: qty 83.34

## 2019-10-10 MED ORDER — METHYLPREDNISOLONE SODIUM SUCC 125 MG IJ SOLR
60.0000 mg | Freq: Four times a day (QID) | INTRAMUSCULAR | Status: DC
  Administered 2019-10-10 – 2019-10-13 (×13): 60 mg via INTRAVENOUS
  Filled 2019-10-10 (×13): qty 2

## 2019-10-10 NOTE — Progress Notes (Signed)
  Amiodarone Drug - Drug Interaction Consult Note  Recommendations: -QTc 440, will monitor -Monitor K and mag closely on Lasix and amio  Amiodarone is metabolized by the cytochrome P450 system and therefore has the potential to cause many drug interactions. Amiodarone has an average plasma half-life of 50 days (range 20 to 100 days).   There is potential for drug interactions to occur several weeks or months after stopping treatment and the onset of drug interactions may be slow after initiating amiodarone.   []  Statins: Increased risk of myopathy. Simvastatin- restrict dose to 20mg  daily. Other statins: counsel patients to report any muscle pain or weakness immediately.  []  Anticoagulants: Amiodarone can increase anticoagulant effect. Consider warfarin dose reduction. Patients should be monitored closely and the dose of anticoagulant altered accordingly, remembering that amiodarone levels take several weeks to stabilize.  []  Antiepileptics: Amiodarone can increase plasma concentration of phenytoin, the dose should be reduced. Note that small changes in phenytoin dose can result in large changes in levels. Monitor patient and counsel on signs of toxicity.  []  Beta blockers: increased risk of bradycardia, AV block and myocardial depression. Sotalol - avoid concomitant use.  []   Calcium channel blockers (diltiazem and verapamil): increased risk of bradycardia, AV block and myocardial depression.  []   Cyclosporine: Amiodarone increases levels of cyclosporine. Reduced dose of cyclosporine is recommended.  []  Digoxin dose should be halved when amiodarone is started.  [x]  Diuretics: increased risk of cardiotoxicity if hypokalemia occurs.  []  Oral hypoglycemic agents (glyburide, glipizide, glimepiride): increased risk of hypoglycemia. Patient's glucose levels should be monitored closely when initiating amiodarone therapy.   []  Drugs that prolong the QT interval:  Torsades de pointes risk may be  increased with concurrent use - avoid if possible.  Monitor QTc, also keep magnesium/potassium WNL if concurrent therapy can't be avoided. Antibiotics: e.g. fluoroquinolones, erythromycin. . Antiarrhythmics: e.g. quinidine, procainamide, disopyramide, sotalol. . Antipsychotics: e.g. phenothiazines, haloperidol.  . Lithium, tricyclic antidepressants, and methadone. Thank You,  D  10/10/2019 11:49 AM

## 2019-10-10 NOTE — Progress Notes (Signed)
NAME:  Roy Lee, MRN:  [id number], DOB:  [birth year], LOS: 21 ADMISSION DATE:  09/12/2019, CONSULTATION DATE:  2/16 REFERRING MD:  Aileen Fass, CHIEF COMPLAINT:  Dyspnea   Brief History   64y/o male treated for COVID 19 pneumonia and PE in January, treated with actemra, steroids, antivirals and dc'd home on 3-5 L O2 and anticoagulation.  Returned to the ED 2/12 with worsening dyspnea and hypoxemia.  Moved to the ICU on 2/16 in the setting of worsening hypoxemia and atrial fibrillation with RVR.  Past Medical History  Obesity Hypertension arthitis  Significant Hospital Events   1/24 original admission, treated with actemra, steroids, antivirals 1/30 d/c home on 3-5 L O2 2/12 return to ED, admission, treated with ceftriaxone/azithro 2/16 move to ICU, intubation, bronchoscopy, prone, afib with rvr started dilt 2/18 fever, started antibiotics 2/21 early AM bright red blood per rectum, vent dyssynchrony Plateau pressure 34, afib with rvr started on amiodarone, nearly off vasopressors, held prone because oxygeantion better 2/23 oxygenation better 2/28 febrile 101.8  Consults:    Procedures:  2/16 ETT >  2/16 L IJ CVL > 2/24 2/24 PICC>   Significant Diagnostic Tests:  2/12 CT angiogram chest > no PE, bilateral infiltrates, nodular, progressed compared to 2/6 film 2/16 CT chest > significant progression of bilateral ground glass and consolidation compared to 2/12 film 2/17 Echo> RV dilation and RV pressure overload, LVEF normal   Micro Data:  1/24 SARS CoV2 Ag >> Positive 2/16 BAL fungal >  2/16 BAL AFB >  2/27 blood >>  2/27 resp >> oral flora  Antimicrobials:  2/27 meropenem >> 2/28 vancomycin >>  Interim history/subjective:  Peak, plateau pressures remain elevated.    Objective   Blood pressure (!) 74/55, pulse 70, temperature (!) 95.9 F (35.5 C), resp. rate (!) 35, height _0  (1.803 m), weight (!) 144.8 kg, SpO2 (!) 81 %.    Vent Mode: PRVC FiO2 (%):  [100  %] 100 % Set Rate:  [28 bmp-35 bmp] 35 bmp Vt Set:  [450 mL] 450 mL PEEP:  [8 cmH20-14 cmH20] 8 cmH20 Plateau Pressure:  [36 cmH20-46 cmH20] 46 cmH20   Intake/Output Summary (Last 24 hours) at 10/10/2019 1058 Last data filed at 10/10/2019 0900 Gross per 24 hour  Intake 4607.03 ml  Output 1350 ml  Net 3257.03 ml   Filed Weights   10/07/19 0500 10/09/19 0233 10/10/19 0129  Weight: (!) 139.2 kg (!) 141.2 kg (!) 144.8 kg    Examination:  General - sedated Eyes - pupils reactive ENT - ETT in place Cardiac - irregular Chest - poor air movement Abdomen - soft, non tender, + bowel sounds Extremities - 2+ edema Skin - no rashes Neuro - RASS - 3  CXR (reviewed by me) - b/l ASD increased   Resolved Hospital Problem list   Epistaxis 2/19, Hemorrhoidal bleeding, Septic shock, Cardiogenic shock with acute RV failure, AKI from ATN 2nd to shock and hypoxia, Elevated LFTs from shock  Assessment & Plan:   Acute hypoxic, hypercapnic respiratory failure with ARDS from COVID 19 pneumonia. - initial infection from 09/03/19 >> might be able to come off airborne isolation soon - late phase of disease - cultures have remained negative - difficult to ventilate, but improved some since changing from [facility] to [facility] on 3/01 - f/u CXR, ABG - continue diuresis - d/c HCO3 from IV fluid 3/02 - check ESR, CRP 3/02 >> if elevated consider adding steroids for pneumonitis  Septic shock (not present  on admission). - presumably from HCAP - continue ABx - pressors to keep MAP > 65  Paroxysmal A fib, new onset. - resume amiodarone 3/02 - continue apixaban  Pulmonary embolism. - seen on CT chest from 09/04/19 - continue apixaban  Hyperglycemia with prediabetes. - SSI with tube feed coverage and lantus  Acute metabolic encephalopathy 2nd to COVID, sepsis, hypoxia. - RASS goal -2 to -3 - continue versed and dilaudid gtt  Anemia of critical illness. - f/u CBC - transfuse for Hb < 7 or significant  bleeding  Goals of care. - family okay with current therapies - no CPR, no dialysis - would be okay with tracheostomy if he is otherwise improving, but not if he will remain vent dependent indefinitely - likely transition to comfort measures if his status gets worse  Best practice:  Diet: tube feeding DVT prophylaxis: eliquis GI prophylaxis: Pantoprazole Mobility: bed rest Code Status: DNR Disposition:  ICU  Labs:   CMP Latest Ref Rng & Units 10/10/2019 10/09/2019 10/09/2019  Glucose 70 - 99 mg/dL 102(H) - -  BUN 8 - 23 mg/dL 75(H) - -  Creatinine 0.61 - 1.24 mg/dL 1.13 - -  Sodium 135 - 145 mmol/L 148(H) 146(H) 148(H)  Potassium 3.5 - 5.1 mmol/L 4.3 4.1 4.1  Chloride 98 - 111 mmol/L 93(L) - -  CO2 22 - 32 mmol/L 44(H) - -  Calcium 8.9 - 10.3 mg/dL 7.9(L) - -  Total Protein 6.5 - 8.1 g/dL - - -  Total Bilirubin 0.3 - 1.2 mg/dL - - -  Alkaline Phos 38 - 126 U/L - - -  AST 15 - 41 U/L - - -  ALT 0 - 44 U/L - - -    CBC Latest Ref Rng & Units 10/10/2019 10/09/2019 10/09/2019  WBC 4.0 - 10.5 K/uL 10.5 - -  Hemoglobin 13.0 - 17.0 g/dL 10.2(L) 11.2(L) 12.2(L)  Hematocrit 39.0 - 52.0 % 35.2(L) 33.0(L) 36.0(L)  Platelets 150 - 400 K/uL 218 - -    ABG    Component Value Date/Time   PHART 7.306 (L) 10/09/2019 1808   PCO2ART >97.0 (HH) 10/09/2019 1808   PO2ART 77 (L) 10/09/2019 1808   HCO3 49.1 (H) 10/07/2019 1953   TCO2 >50 (H) 10/07/2019 1953   ACIDBASEDEF 7.0 (H) 09/26/2019 1136   O2SAT 91.0 10/07/2019 1953    CBG (last 3)  Recent Labs    10/09/19 2327 10/10/19 0319 10/10/19 0737  GLUCAP 109* 110* 105*    CC time 40 minutes  Chesley Mires, MD [facility] 10/10/2019, 11:23 AM

## 2019-10-10 NOTE — Progress Notes (Signed)
Nutrition Follow-up  DOCUMENTATION CODES:   Obesity unspecified  INTERVENTION:   Continue TF via Cortrak: Vital AF 1.2 at 90 ml/h (2160 ml per day) Provides 2592 kcal, 162 gm protein, 1752 ml free water daily Free water flushes 200 ml every 4 hours for 2952 ml total free water intake per day.  NUTRITION DIAGNOSIS:   Increased nutrient needs related to acute illness, catabolic illness(COVID 19) as evidenced by estimated needs.  Ongoing  GOAL:   Patient will meet greater than or equal to 90% of their needs   Met with TF  MONITOR:   TF tolerance, Vent status, Labs  REASON FOR ASSESSMENT:   Ventilator, Consult Enteral/tube feeding initiation and management  ASSESSMENT:   64 yo male admitted 2/12 with worsening dyspnea and hypoxemia. Required intubation on 2/16. PMH includes COVID-19 PNA & PE in January 2021, obesity, HTN, arthritis.   Cortrak placed 2/17, tip in stomach.  Receiving Vital AF 1.2 at 90 ml; free water flushes 200 ml every 4 hours.  MD is discussing possible transition to comfort care if patient continues to decline.  Patient remains intubated on ventilator support MV: 14.9 L/min Temp (24hrs), Avg:97.9 F (36.6 C), Min:92.7 F (33.7 C), Max:100 F (37.8 C)   Labs reviewed. Na 148 (H), phos 5.1 (H) CBG's: 109-110-105  Medications reviewed and include levophed, vitamin C, lasix, lantus, novolog, miralax, zinc.   I/O +24.4 L since admission Weight 144.8 kg today; 117.9 kg on admission  Diet Order:   Diet Order            Diet NPO time specified  Diet effective now              EDUCATION NEEDS:   Not appropriate for education at this time  Skin:  Skin Assessment: Skin Integrity Issues: Skin Integrity Issues:: DTI DTI: toe and sacrum  Last BM:  3/2  Height:   Ht Readings from Last 1 Encounters:  10/07/19 _0  (1.803 m)    Weight:   Wt Readings from Last 1 Encounters:  10/10/19 (!) 144.8 kg   Admission weight 117.9  kg  Ideal Body Weight:  78.2 kg  Estimated Nutritional Needs:   Kcal:  2500-3000  Protein:  >/= 156 gm  Fluid:  >/= 2.5 L    Molli Barrows, RD, LDN, CNSC Please refer to Amion for contact information.

## 2019-10-10 NOTE — Progress Notes (Signed)
Updated daughter. All questions answered. Will update once patient transfers to [facility]

## 2019-10-10 NOTE — Progress Notes (Signed)
eLink Physician-Brief Progress Note Patient Name: Roy Lee DOB: 1955-10-26 MRN: [id number]   Date of Service  10/10/2019  HPI/Events of Note  Oliguria - Bladder scan with > 500 mL residual.   eICU Interventions  Will order: 1. I/O Cath PRN.      Intervention Category Major Interventions: Other:  [name] 10/10/2019, 12:24 AM

## 2019-10-11 ENCOUNTER — Inpatient Hospital Stay (HOSPITAL_COMMUNITY): Payer: 59

## 2019-10-11 DIAGNOSIS — R001 Bradycardia, unspecified: Secondary | ICD-10-CM

## 2019-10-11 LAB — POCT I-STAT 7, (LYTES, BLD GAS, ICA,H+H)
Acid-Base Excess: 21 mmol/L — ABNORMAL HIGH (ref 0.0–2.0)
Bicarbonate: 50.4 mmol/L — ABNORMAL HIGH (ref 20.0–28.0)
Calcium, Ion: 1.1 mmol/L — ABNORMAL LOW (ref 1.15–1.40)
HCT: 31 % — ABNORMAL LOW (ref 39.0–52.0)
Hemoglobin: 10.5 g/dL — ABNORMAL LOW (ref 13.0–17.0)
O2 Saturation: 85 %
Patient temperature: 98.5
Potassium: 4.5 mmol/L (ref 3.5–5.1)
Sodium: 147 mmol/L — ABNORMAL HIGH (ref 135–145)
TCO2: >50 mmol/L — ABNORMAL HIGH (ref 22–32)
pCO2 arterial: 86.3 mmHg (ref 32.0–48.0)
pH, Arterial: 7.375 (ref 7.350–7.450)
pO2, Arterial: 56 mmHg — ABNORMAL LOW (ref 83.0–108.0)

## 2019-10-11 LAB — GLUCOSE, CAPILLARY
Glucose-Capillary: 193 mg/dL — ABNORMAL HIGH (ref 70–99)
Glucose-Capillary: 150 mg/dL — ABNORMAL HIGH (ref 70–99)
Glucose-Capillary: 150 mg/dL — ABNORMAL HIGH (ref 70–99)
Glucose-Capillary: 147 mg/dL — ABNORMAL HIGH (ref 70–99)
Glucose-Capillary: 191 mg/dL — ABNORMAL HIGH (ref 70–99)
Glucose-Capillary: 179 mg/dL — ABNORMAL HIGH (ref 70–99)

## 2019-10-11 LAB — SEDIMENTATION RATE: Sed Rate: 78 mm/hr — ABNORMAL HIGH (ref 0–16)

## 2019-10-11 LAB — C-REACTIVE PROTEIN: CRP: 16.6 mg/dL — ABNORMAL HIGH (ref <1.0)

## 2019-10-11 NOTE — Progress Notes (Signed)
NAME:  Roy Lee, MRN:  [id number], DOB:  Feb 04, 1956, LOS: 64 ADMISSION DATE:  10/04/2019, CONSULTATION DATE:  2/16 REFERRING MD:  Aileen Fass, CHIEF COMPLAINT:  Dyspnea   Brief History   64y/o male treated for COVID 19 pneumonia and PE in January, treated with actemra, steroids, antivirals and dc'd home on 3-5 L O2 and anticoagulation.  Returned to the ED 2/12 with worsening dyspnea and hypoxemia.  Moved to the ICU on 2/16 in the setting of worsening hypoxemia and atrial fibrillation with RVR.  Past Medical History  Obesity Hypertension arthitis  Significant Hospital Events   1/24 original admission, treated with actemra, steroids, antivirals 1/30 d/c home on 3-5 L O2 2/12 return to ED, admission, treated with ceftriaxone/azithro 2/16 move to ICU, intubation, bronchoscopy, prone, afib with rvr started dilt 2/18 fever, started antibiotics 2/21 early AM bright red blood per rectum, vent dyssynchrony Plateau pressure 34, afib with rvr started on amiodarone, nearly off vasopressors, held prone because oxygeantion better 2/23 oxygenation better 2/28 febrile 101.8  Consults:    Procedures:  2/16 ETT >  2/16 L IJ CVL > 2/24 2/24 PICC>   Significant Diagnostic Tests:  2/12 CT angiogram chest > no PE, bilateral infiltrates, nodular, progressed compared to 2/6 film 2/16 CT chest > significant progression of bilateral ground glass and consolidation compared to 2/12 film 2/17 Echo> RV dilation and RV pressure overload, LVEF normal   Micro Data:  1/24 SARS CoV2 Ag >> Positive 2/16 BAL fungal >  2/16 BAL AFB >  2/27 blood >>  2/27 resp >> oral flora  Antimicrobials:  2/27 meropenem >> 2/28 vancomycin >>  Interim history/subjective:  Peak, plateau pressures remain elevated.   No overnight events  Objective   Blood pressure 111/81, pulse 64, temperature 98.5 F (36.9 C), temperature source Oral, resp. rate (!) 28, height _0  (1.803 m), weight (!) 143.1 kg, SpO2 (!) 89  %.    Vent Mode: PRVC FiO2 (%):  [100 %] 100 % Set Rate:  [35 bmp] 35 bmp Vt Set:  [450 mL] 450 mL PEEP:  [8 cmH20] 8 cmH20 Plateau Pressure:  [33 cmH20-42 cmH20] 40 cmH20   Intake/Output Summary (Last 24 hours) at 10/11/2019 1033 Last data filed at 10/11/2019 1000 Gross per 24 hour  Intake 1851.38 ml  Output 1870 ml  Net -18.62 ml   Filed Weights   10/09/19 0233 10/10/19 0129 10/11/19 0329  Weight: (!) 141.2 kg (!) 144.8 kg (!) 143.1 kg    Examination: General -sedated does not appear to be in distress Eyes - pupils reactive ENT - ETT in place, moist oral mucosa Cardiac - irregular, S1-S2 appreciated Chest - poor air movement, rales at the bases Abdomen - soft, non tender, + bowel sounds Extremities - 2+ edema Skin - no rashes Neuro - RASS - 3  CXR (reviewed by me) -bilateral infiltrate-mild improvement   Resolved Hospital Problem list   Epistaxis 2/19, Hemorrhoidal bleeding, Septic shock, Cardiogenic shock with acute RV failure, AKI from ATN 2nd to shock and hypoxia, Elevated LFTs from shock  Assessment & Plan:   Acute hypoxic, hypercapnic respiratory failure with ARDS from COVID 19 pneumonia. - initial infection from 09/03/19 >> might be able to come off airborne isolation soon - late phase of disease - cultures have remained negative - difficult to ventilate, but improved some since changing from [facility] to [facility] on 3/01 - continue diuresis - check ESR, CRP 3/02 >> if elevated consider adding steroids for pneumonitis  Septic shock (not present on admission). - presumably from HCAP - continue ABx - pressors to keep MAP > 65  Paroxysmal A fib, new onset. - resumed amiodarone 3/02 - continue apixaban  Pulmonary embolism. - seen on CT chest from 09/04/19 - continue apixaban  Hyperglycemia with prediabetes. - SSI with tube feed coverage and lantus  Acute metabolic encephalopathy 2nd to COVID, sepsis, hypoxia. - RASS goal -2 to -3 - continue versed and dilaudid  gtt  Anemia of critical illness. - transfuse for Hb < 7 or significant bleeding  Goals of care. - family okay with current therapies - no CPR, no dialysis - would be okay with tracheostomy if he is otherwise improving, but not if he will remain vent dependent indefinitely - likely transition to comfort measures if his status gets worse  Best practice:  Diet: tube feeding DVT prophylaxis: eliquis GI prophylaxis: Pantoprazole Mobility: bed rest Code Status: DNR Disposition:  ICU  Labs:   CMP Latest Ref Rng & Units 10/10/2019 10/09/2019 10/09/2019  Glucose 70 - 99 mg/dL 102(H) - -  BUN 8 - 23 mg/dL 75(H) - -  Creatinine 0.61 - 1.24 mg/dL 1.13 - -  Sodium 135 - 145 mmol/L 148(H) 146(H) 148(H)  Potassium 3.5 - 5.1 mmol/L 4.3 4.1 4.1  Chloride 98 - 111 mmol/L 93(L) - -  CO2 22 - 32 mmol/L 44(H) - -  Calcium 8.9 - 10.3 mg/dL 7.9(L) - -  Total Protein 6.5 - 8.1 g/dL - - -  Total Bilirubin 0.3 - 1.2 mg/dL - - -  Alkaline Phos 38 - 126 U/L - - -  AST 15 - 41 U/L - - -  ALT 0 - 44 U/L - - -    CBC Latest Ref Rng & Units 10/10/2019 10/09/2019 10/09/2019  WBC 4.0 - 10.5 K/uL 10.5 - -  Hemoglobin 13.0 - 17.0 g/dL 10.2(L) 11.2(L) 12.2(L)  Hematocrit 39.0 - 52.0 % 35.2(L) 33.0(L) 36.0(L)  Platelets 150 - 400 K/uL 218 - -    ABG    Component Value Date/Time   PHART 7.306 (L) 10/09/2019 1808   PCO2ART >97.0 (HH) 10/09/2019 1808   PO2ART 77 (L) 10/09/2019 1808   HCO3 49.1 (H) 10/07/2019 1953   TCO2 >50 (H) 10/07/2019 1953   ACIDBASEDEF 7.0 (H) 09/26/2019 1136   O2SAT 91.0 10/07/2019 1953    CBG (last 3)  Recent Labs    10/10/19 2300 10/11/19 0307 10/11/19 0812  GLUCAP 185* 193* 150*   The patient is critically ill with multiple organ systems failure and requires high complexity decision making for assessment and support, frequent evaluation and titration of therapies, application of advanced monitoring technologies and extensive interpretation of multiple databases. Critical Care  Time devoted to patient care services described in this note independent of APP/resident time (if applicable)  is 32 minutes.   Sherrilyn Rist MD [facility] Personal pager: [phone] If unanswered, please page CCM On-call: [phone]

## 2019-10-11 NOTE — Progress Notes (Signed)
Pharmacy Antibiotic Note  Roy Lee is a 64 y.o. male admitted on 09/24/2019 with COVID-19.  Pharmacy has been consulted for meropenem dosing for sepsis and fevers. Fevers and leukocytosis have resolved since starting meropenem with no culture growth. MD wants to finish a 7 day course of abx  Plan: Continue Meropenem 1g IV q8h. D#5/7  Follow up renal function, culture results, and clinical course.   Height: _0  (180.3 cm) Weight: (!) 315 lb 7.7 oz (143.1 kg) IBW/kg (Calculated) : 75.3  Temp (24hrs), Avg:99.2 F (37.3 C), Min:98.2 F (36.8 C), Max:100.2 F (37.9 C)  Recent Labs  Lab 10/05/19 0445 10/05/19 0445 10/06/19 0413 10/07/19 0435 10/08/19 0754 10/09/19 0426 10/10/19 0442  WBC 15.4*  --  13.4* 12.9*  --  13.9* 10.5  CREATININE 1.06   < > 0.97 0.89 0.92 0.92 1.13   < > = values in this interval not displayed.    Estimated Creatinine Clearance: 99.4 mL/min (by C-G formula based on SCr of 1.13 mg/dL).    No Known Allergies  Antimicrobials this admission: 2/12 ceftriaxone >>2/17 2/12 azithromycin >>2/17 2/18 Cefepime >> 2/23 2/18 Vanc >> 2/23, 2/28 x1  2/27 Meropenem >>   Dose adjustments this admission: 2/10 VP 20, VT 9>>AUC 353  Microbiology results: 2/14 sputum: normal flora  2/12 bcx x2: negF  2/16 BAL >> negF  2/16 MRSA PCR neg  2/18 BCx >> negF 218 TA >> neg 2/18 UCx >> negF 2/27 BCx: ngtd 2/27 TA: normal flora  2/28 MRSA PCR:  Neg   Thank you for allowing pharmacy to be a part of this patient's care.  Albertina Parr, PharmD., BCPS Clinical Pharmacist Clinical phone for 10/11/19 until 5pm: [phone]

## 2019-10-11 NOTE — Progress Notes (Addendum)
RT note- Panic ABG values reported to Dr. Wynona Neat.

## 2019-10-11 NOTE — Plan of Care (Signed)
  Problem: Respiratory: Goal: Will maintain a patent airway Outcome: Progressing Goal: Complications related to the disease process, condition or treatment will be avoided or minimized Outcome: Progressing   Problem: Clinical Measurements: Goal: Ability to maintain clinical measurements within normal limits will improve Outcome: Progressing Goal: Will remain free from infection Outcome: Progressing Goal: Diagnostic test results will improve Outcome: Progressing Goal: Respiratory complications will improve Outcome: Progressing Goal: Cardiovascular complication will be avoided Outcome: Progressing   Problem: Elimination: Goal: Will not experience complications related to bowel motility Outcome: Progressing Goal: Will not experience complications related to urinary retention Outcome: Progressing   Problem: Pain Managment: Goal: General experience of comfort will improve Outcome: Progressing   Problem: Safety: Goal: Ability to remain free from injury will improve Outcome: Progressing

## 2019-10-11 NOTE — Progress Notes (Addendum)
2200 Mr. Warda's daughter, Gentry Fitz, was updated via telephone.  All questions and concerns addressed.  0246 Norepi off

## 2019-10-12 LAB — BASIC METABOLIC PANEL
Anion gap: 11 (ref 5–15)
Anion gap: 10 (ref 5–15)
BUN: 107 mg/dL — ABNORMAL HIGH (ref 8–23)
BUN: 109 mg/dL — ABNORMAL HIGH (ref 8–23)
CO2: 45 mmol/L — ABNORMAL HIGH (ref 22–32)
CO2: 46 mmol/L — ABNORMAL HIGH (ref 22–32)
Calcium: 8.2 mg/dL — ABNORMAL LOW (ref 8.9–10.3)
Calcium: 8.4 mg/dL — ABNORMAL LOW (ref 8.9–10.3)
Chloride: 93 mmol/L — ABNORMAL LOW (ref 98–111)
Chloride: 92 mmol/L — ABNORMAL LOW (ref 98–111)
Creatinine, Ser: 1.61 mg/dL — ABNORMAL HIGH (ref 0.61–1.24)
Creatinine, Ser: 1.7 mg/dL — ABNORMAL HIGH (ref 0.61–1.24)
GFR calc Af Amer: 53 mL/min — ABNORMAL LOW (ref >60)
GFR calc Af Amer: 49 mL/min — ABNORMAL LOW (ref >60)
GFR calc non Af Amer: 45 mL/min — ABNORMAL LOW (ref >60)
GFR calc non Af Amer: 43 mL/min — ABNORMAL LOW (ref >60)
Glucose, Bld: 134 mg/dL — ABNORMAL HIGH (ref 70–99)
Glucose, Bld: 146 mg/dL — ABNORMAL HIGH (ref 70–99)
Potassium: 4.5 mmol/L (ref 3.5–5.1)
Potassium: 4.2 mmol/L (ref 3.5–5.1)
Sodium: 149 mmol/L — ABNORMAL HIGH (ref 135–145)
Sodium: 148 mmol/L — ABNORMAL HIGH (ref 135–145)

## 2019-10-12 LAB — CULTURE, BLOOD (ROUTINE X 2)
Culture: NO GROWTH
Culture: NO GROWTH

## 2019-10-12 LAB — GLUCOSE, CAPILLARY
Glucose-Capillary: 123 mg/dL — ABNORMAL HIGH (ref 70–99)
Glucose-Capillary: 137 mg/dL — ABNORMAL HIGH (ref 70–99)
Glucose-Capillary: 135 mg/dL — ABNORMAL HIGH (ref 70–99)
Glucose-Capillary: 135 mg/dL — ABNORMAL HIGH (ref 70–99)
Glucose-Capillary: 140 mg/dL — ABNORMAL HIGH (ref 70–99)

## 2019-10-12 MED ORDER — SODIUM CHLORIDE 0.45 % IV SOLN: INTRAVENOUS | Status: DC

## 2019-10-12 NOTE — Progress Notes (Signed)
NAME:  Roy Lee, MRN:  [id number], DOB:  1955-11-15, LOS: 31 ADMISSION DATE:  09/24/2019, CONSULTATION DATE:  2/16 REFERRING MD:  Aileen Fass, CHIEF COMPLAINT:  Dyspnea   Brief History   64y/o male treated for COVID 19 pneumonia and PE in January, treated with actemra, steroids, antivirals and dc'd home on 3-5 L O2 and anticoagulation.  Returned to the ED 2/12 with worsening dyspnea and hypoxemia.  Moved to the ICU on 2/16 in the setting of worsening hypoxemia and atrial fibrillation with RVR.  Past Medical History  Obesity Hypertension arthitis  Significant Hospital Events   1/24 original admission, treated with actemra, steroids, antivirals 1/30 d/c home on 3-5 L O2 2/12 return to ED, admission, treated with ceftriaxone/azithro 2/16 move to ICU, intubation, bronchoscopy, prone, afib with rvr started dilt 2/18 fever, started antibiotics 2/21 early AM bright red blood per rectum, vent dyssynchrony Plateau pressure 34, afib with rvr started on amiodarone, nearly off vasopressors, held prone because oxygeantion better 2/23 oxygenation better 2/28 febrile 101.8 3/4 afebrile  Consults:    Procedures:  2/16 ETT >  2/16 L IJ CVL > 2/24 2/24 PICC>   Significant Diagnostic Tests:  2/12 CT angiogram chest > no PE, bilateral infiltrates, nodular, progressed compared to 2/6 film 2/16 CT chest > significant progression of bilateral ground glass and consolidation compared to 2/12 film 2/17 Echo> RV dilation and RV pressure overload, LVEF normal   Micro Data:  1/24 SARS CoV2 Ag >> Positive 2/16 BAL fungal >  2/16 BAL AFB >  2/27 blood >>  2/27 resp >> oral flora  Antimicrobials:  2/27 meropenem >> 2/28 vancomycin >>  Interim history/subjective:  Peak, plateau pressures remain elevated.   No overnight events No significant improvement in status  Objective   Blood pressure 133/82, pulse 86, temperature 97.7 F (36.5 C), temperature source Oral, resp. rate (!) 30, height  _0  (1.803 m), weight (!) 145.9 kg, SpO2 96 %.    Vent Mode: PRVC FiO2 (%):  [100 %] 100 % Set Rate:  [35 bmp] 35 bmp Vt Set:  [450 mL] 450 mL PEEP:  [8 cmH20-10 cmH20] 10 cmH20 Plateau Pressure:  [37 cmH20-44 cmH20] 41 cmH20   Intake/Output Summary (Last 24 hours) at 10/12/2019 0957 Last data filed at 10/12/2019 0858 Gross per 24 hour  Intake 2322.41 ml  Output 1250 ml  Net 1072.41 ml   Filed Weights   10/10/19 0129 10/11/19 0329 10/12/19 0449  Weight: (!) 144.8 kg (!) 143.1 kg (!) 145.9 kg    Examination: General -sedated does not appear to be in distress Eyes - pupils reactive ENT -ET tube in place, moist oral mucosa Cardiac - irregular, S1-S2 appreciated Chest - poor air movement, few rales at the bases Abdomen -bowel sounds appreciated Extremities - 2+ edema Skin - no rashes Neuro - RASS - 3  CXR (reviewed by me) -bilateral infiltrate-mild improvement 3/3   Resolved Hospital Problem list   Epistaxis 2/19, Hemorrhoidal bleeding, Septic shock, Cardiogenic shock with acute RV failure, AKI from ATN 2nd to shock and hypoxia, Elevated LFTs from shock  Assessment & Plan:   Acute hypoxic, hypercapnic respiratory failure with ARDS from COVID 19 pneumonia. - initial infection from 09/03/19 -Did discuss with infectious disease-patient may be transferred out of Covid unit - late phase of disease - cultures have remained negative - difficult to ventilate, but improved some since changing from [facility] to [facility] on 3/01 - continue diuresis - check ESR, CRP-elevated-on steroids  Septic  shock (not present on admission). - presumably from HCAP - continue ABx-on meropenem - pressors to keep MAP > 65  Paroxysmal A fib, new onset. - resumed amiodarone 3/02 - continue apixaban  Acute kidney injury Half-normal saline Trend electrolytes  Hypernatremia -free water -Half normal saline  Pulmonary embolism. - seen on CT chest from 09/04/19 - continue apixaban  Hyperglycemia with  prediabetes. - SSI with tube feed coverage and lantus  Acute metabolic encephalopathy 2nd to COVID, sepsis, hypoxia. - RASS goal -2 to -3 - continue versed and dilaudid gtt  Anemia of critical illness. - transfuse for Hb < 7 or significant bleeding  Goals of care. Discussed with daughter today Stable but not showing signs of significant improvement so far Still requiring full ventilator support, 100% FiO2 Discussions have been ongoing about comfort measures Did discuss with infectious disease, patient can be transferred out of Covid unit Family will be able to visit, this will help them in making decisions regarding ongoing therapies and goals of care which may include terminal extubation and comfort  Prognosis unfortunately is poor Best practice:  Diet: tube feeding DVT prophylaxis: eliquis GI prophylaxis: Pantoprazole Mobility: bed rest Code Status: DNR Disposition:  ICU  Labs:   CMP Latest Ref Rng & Units 10/12/2019 10/11/2019 10/10/2019  Glucose 70 - 99 mg/dL 134(H) - 102(H)  BUN 8 - 23 mg/dL 107(H) - 75(H)  Creatinine 0.61 - 1.24 mg/dL 1.61(H) - 1.13  Sodium 135 - 145 mmol/L 149(H) 147(H) 148(H)  Potassium 3.5 - 5.1 mmol/L 4.5 4.5 4.3  Chloride 98 - 111 mmol/L 93(L) - 93(L)  CO2 22 - 32 mmol/L 45(H) - 44(H)  Calcium 8.9 - 10.3 mg/dL 8.2(L) - 7.9(L)  Total Protein 6.5 - 8.1 g/dL - - -  Total Bilirubin 0.3 - 1.2 mg/dL - - -  Alkaline Phos 38 - 126 U/L - - -  AST 15 - 41 U/L - - -  ALT 0 - 44 U/L - - -    CBC Latest Ref Rng & Units 10/11/2019 10/10/2019 10/09/2019  WBC 4.0 - 10.5 K/uL - 10.5 -  Hemoglobin 13.0 - 17.0 g/dL 10.5(L) 10.2(L) 11.2(L)  Hematocrit 39.0 - 52.0 % 31.0(L) 35.2(L) 33.0(L)  Platelets 150 - 400 K/uL - 218 -   ABG    Component Value Date/Time   PHART 7.375 10/11/2019 1134   PCO2ART 86.3 (HH) 10/11/2019 1134   PO2ART 56.0 (L) 10/11/2019 1134   HCO3 50.4 (H) 10/11/2019 1134   TCO2 >50 (H) 10/11/2019 1134   ACIDBASEDEF 7.0 (H) 09/26/2019 1136   O2SAT  85.0 10/11/2019 1134   CBG (last 3)  Recent Labs    10/11/19 2310 10/12/19 0255 10/12/19 0818  GLUCAP 179* 123* 137*   The patient is critically ill with multiple organ systems failure and requires high complexity decision making for assessment and support, frequent evaluation and titration of therapies, application of advanced monitoring technologies and extensive interpretation of multiple databases. Critical Care Time devoted to patient care services described in this note independent of APP/resident time (if applicable)  is 40 minutes.   Sherrilyn Rist MD [facility] Personal pager: [phone] If unanswered, please page CCM On-call: [phone]

## 2019-10-13 LAB — CBC
HCT: 34.8 % — ABNORMAL LOW (ref 39.0–52.0)
Hemoglobin: 10 g/dL — ABNORMAL LOW (ref 13.0–17.0)
MCH: 28.9 pg (ref 26.0–34.0)
MCHC: 28.7 g/dL — ABNORMAL LOW (ref 30.0–36.0)
MCV: 100.6 fL — ABNORMAL HIGH (ref 80.0–100.0)
Platelets: 200 10*3/uL (ref 150–400)
RBC: 3.46 MIL/uL — ABNORMAL LOW (ref 4.22–5.81)
RDW: 16.5 % — ABNORMAL HIGH (ref 11.5–15.5)
WBC: 8.9 10*3/uL (ref 4.0–10.5)
nRBC: 0 % (ref 0.0–0.2)

## 2019-10-13 LAB — BASIC METABOLIC PANEL
Anion gap: 9 (ref 5–15)
BUN: 108 mg/dL — ABNORMAL HIGH (ref 8–23)
CO2: 46 mmol/L — ABNORMAL HIGH (ref 22–32)
Calcium: 8.3 mg/dL — ABNORMAL LOW (ref 8.9–10.3)
Chloride: 92 mmol/L — ABNORMAL LOW (ref 98–111)
Creatinine, Ser: 1.45 mg/dL — ABNORMAL HIGH (ref 0.61–1.24)
GFR calc Af Amer: 60 mL/min — ABNORMAL LOW (ref >60)
GFR calc non Af Amer: 52 mL/min — ABNORMAL LOW (ref >60)
Glucose, Bld: 212 mg/dL — ABNORMAL HIGH (ref 70–99)
Potassium: 4.4 mmol/L (ref 3.5–5.1)
Sodium: 147 mmol/L — ABNORMAL HIGH (ref 135–145)

## 2019-10-13 LAB — GLUCOSE, CAPILLARY
Glucose-Capillary: 185 mg/dL — ABNORMAL HIGH (ref 70–99)
Glucose-Capillary: 209 mg/dL — ABNORMAL HIGH (ref 70–99)
Glucose-Capillary: 210 mg/dL — ABNORMAL HIGH (ref 70–99)
Glucose-Capillary: 252 mg/dL — ABNORMAL HIGH (ref 70–99)

## 2019-10-13 LAB — BLOOD GAS, ARTERIAL
Acid-Base Excess: 19.1 mmol/L — ABNORMAL HIGH (ref 0.0–2.0)
Bicarbonate: 46.6 mmol/L — ABNORMAL HIGH (ref 20.0–28.0)
FIO2: 100
O2 Saturation: 87.5 %
Patient temperature: 35.9
pCO2 arterial: 97.8 mmHg (ref 32.0–48.0)
pH, Arterial: 7.292 — ABNORMAL LOW (ref 7.350–7.450)
pO2, Arterial: 59 mmHg — ABNORMAL LOW (ref 83.0–108.0)

## 2019-10-13 MED ORDER — INSULIN GLARGINE 100 UNIT/ML ~~LOC~~ SOLN
14.0000 [IU] | Freq: Two times a day (BID) | SUBCUTANEOUS | Status: DC
  Filled 2019-10-13: qty 0.14

## 2019-10-13 MED ORDER — ATROPINE SULFATE 1 % OP SOLN
4.0000 [drp] | OPHTHALMIC | Status: DC | PRN
  Filled 2019-10-13: qty 2

## 2019-10-13 MED ORDER — DEXTROSE 5 % IV SOLN: INTRAVENOUS | Status: DC

## 2019-10-13 MED ORDER — OXYCODONE HCL 5 MG PO TABS
5.0000 mg | ORAL_TABLET | Freq: Four times a day (QID) | ORAL | Status: DC
  Administered 2019-10-13: 5 mg
  Filled 2019-10-13: qty 1

## 2019-10-13 MED ORDER — ACETAZOLAMIDE 250 MG PO TABS
250.0000 mg | ORAL_TABLET | Freq: Two times a day (BID) | ORAL | Status: DC
  Administered 2019-10-13: 250 mg
  Filled 2019-10-13 (×2): qty 1

## 2019-10-13 MED ORDER — DIPHENHYDRAMINE HCL 50 MG/ML IJ SOLN: 25.0000 mg | INTRAMUSCULAR | Status: DC | PRN

## 2019-10-13 MED ORDER — MORPHINE SULFATE (PF) 4 MG/ML IV SOLN
4.0000 mg | INTRAVENOUS | Status: DC | PRN
  Administered 2019-10-13 (×3): 4 mg via INTRAVENOUS
  Filled 2019-10-13 (×3): qty 1

## 2019-10-13 MED ORDER — LORAZEPAM 2 MG/ML IJ SOLN
1.0000 mg | INTRAMUSCULAR | Status: DC | PRN
  Administered 2019-10-13: 1 mg via INTRAVENOUS
  Filled 2019-10-13 (×2): qty 1

## 2019-10-13 MED ORDER — POLYVINYL ALCOHOL 1.4 % OP SOLN
1.0000 [drp] | Freq: Four times a day (QID) | OPHTHALMIC | Status: DC | PRN
  Filled 2019-10-13: qty 15

## 2019-10-13 MED ORDER — INSULIN ASPART 100 UNIT/ML ~~LOC~~ SOLN
5.0000 [IU] | SUBCUTANEOUS | Status: DC
  Administered 2019-10-13: 5 [IU] via SUBCUTANEOUS

## 2019-10-13 MED ORDER — FUROSEMIDE 10 MG/ML IJ SOLN
40.0000 mg | Freq: Once | INTRAMUSCULAR | Status: AC
  Administered 2019-10-13: 40 mg via INTRAVENOUS
  Filled 2019-10-13: qty 4

## 2019-10-13 MED ORDER — VITAL AF 1.2 CAL PO LIQD
1000.0000 mL | ORAL | Status: DC
  Administered 2019-10-13: 1000 mL

## 2019-10-13 MED ORDER — POTASSIUM CHLORIDE 20 MEQ/15ML (10%) PO SOLN
20.0000 meq | Freq: Once | ORAL | Status: AC
  Administered 2019-10-13: 20 meq
  Filled 2019-10-13: qty 15

## 2019-10-13 MED ORDER — PRO-STAT SUGAR FREE PO LIQD: 30.0000 mL | Freq: Three times a day (TID) | ORAL | Status: DC

## 2019-10-13 MED ORDER — FREE WATER
200.0000 mL | Status: DC
  Administered 2019-10-13 (×2): 200 mL

## 2019-10-13 MED ORDER — CLONAZEPAM 0.5 MG PO TABS
0.5000 mg | ORAL_TABLET | Freq: Three times a day (TID) | ORAL | Status: DC
  Administered 2019-10-13: 0.5 mg via ORAL
  Filled 2019-10-13: qty 1

## 2019-10-13 MED ORDER — ONDANSETRON 4 MG PO TBDP: 4.0000 mg | ORAL_TABLET | Freq: Four times a day (QID) | ORAL | Status: DC | PRN

## 2019-10-13 MED ORDER — ONDANSETRON HCL 4 MG/2ML IJ SOLN
4.0000 mg | Freq: Four times a day (QID) | INTRAMUSCULAR | Status: DC | PRN
  Administered 2019-10-13: 4 mg via INTRAVENOUS
  Filled 2019-10-13: qty 2

## 2019-10-13 MED ORDER — CLONAZEPAM 0.5 MG PO TABS: 0.5000 mg | ORAL_TABLET | Freq: Three times a day (TID) | ORAL | Status: DC

## 2019-10-16 DIAGNOSIS — A419 Sepsis, unspecified organism: Secondary | ICD-10-CM

## 2019-10-16 DIAGNOSIS — N179 Acute kidney failure, unspecified: Secondary | ICD-10-CM

## 2019-10-17 LAB — CULTURE, FUNGUS WITHOUT SMEAR: Special Requests: NORMAL

## 2019-11-09 LAB — ACID FAST CULTURE WITH REFLEXED SENSITIVITIES (MYCOBACTERIA): Acid Fast Culture: NEGATIVE

## 2019-11-09 NOTE — Progress Notes (Signed)
NAME:  Roy Lee, MRN:  [id number], DOB:  [birth year], LOS: 21 ADMISSION DATE:  09/23/2019, CONSULTATION DATE:  2/16 REFERRING MD:  Aileen Fass, CHIEF COMPLAINT:  Dyspnea   Brief History   64y/o male treated for COVID 19 pneumonia and PE in January, treated with actemra, steroids, antivirals and dc'd home on 3-5 L O2 and anticoagulation.  Returned to the ED 2/12 with worsening dyspnea and hypoxemia.  Moved to the ICU on 2/16 in the setting of worsening hypoxemia and atrial fibrillation with RVR.  Past Medical History  Obesity Hypertension Arthitis  Significant Hospital Events   1/24 original admission, treated with actemra, steroids, antivirals 1/30 Discharged home on 3-5 L O2 2/12 Return to ED, admission, treated with ceftriaxone/azithro 2/16 move to ICU, intubation, bronchoscopy, prone, afib with rvr started dilt 2/18 fever, started antibiotics 2/21 BRBPR, vent dyssynchrony, AFwRVR > amiodarone, weaning vasopressors, prone stopped  2/23 oxygenation better 2/28 febrile 101.8 3/04 afebrile  Consults:    Procedures:  L IJ CVL 2/16 >> 2/24  ETT 2/16 >>  PICC 2/24 >>   Significant Diagnostic Tests:   CTA chest 2/12 >> no PE, bilateral infiltrates, nodular, progressed compared to 2/6 film  CT chest 2/16 >>  progression of bilateral ground glass and consolidation compared to 2/12 film  ECHO 2/17 >> RV dilation and RV pressure overload, LVEF normal   Micro Data:  COVID antigen 1/24 >> positive  Sputum 2/14 >> Normal flora  BCx2 2/12 >> negative  AFB 2/16 >> smear negative >> Fungal Culture 2/16 >>   BAL 2/16 >> negative  UC 2/18 >> negative  Tracheal Aspirate 2/18 >> negative BCx2 2/18 >> negative  Tracheal Aspirate 2/27 >> negative  BCx2 2/27 >> negative   Antimicrobials:  Vanco 2/27 >> 2/28 Meropenem 2/27 >> 3/5  Interim history/subjective:  RN reports intermittent periods of movement, vent dyssynchrony and desatruations Remains on dilaudid + fentanyl gtt's   Peak 41, Pplat 38, PEEP 10, FiO2 100%, driving pressure 28 Glucose range 185-210 I/O  UOP 1.3L, +1.3L  Objective   Blood pressure 128/83, pulse 70, temperature (!) 96.5 F (35.8 C), temperature source Axillary, resp. rate (!) 25, height _0  (1.803 m), weight (!) 147.8 kg, SpO2 (!) 89 %.    Vent Mode: PRVC FiO2 (%):  [100 %] 100 % Set Rate:  [35 bmp-36 bmp] 36 bmp Vt Set:  [450 mL] 450 mL PEEP:  [10 cmH20] 10 cmH20 Plateau Pressure:  [35 cmH20-43 cmH20] 38 cmH20   Intake/Output Summary (Last 24 hours) at 10-27-2019 1017 Last data filed at 10/27/19 1000 Gross per 24 hour  Intake 6651.68 ml  Output 1346 ml  Net 5305.68 ml   Filed Weights   10/11/19 0329 10/12/19 0449 2019/10/27 0447  Weight: (!) 143.1 kg (!) 145.9 kg (!) 147.8 kg    Examination: General: critically ill appearing adult male lying in bed on vent in NAD HEENT: MM pink/moist, ETT, anicteric, cuff leak noted  Neuro: sedate, occasional spontaneous movements noted CV: s1s2 RRR, SR in 60's on monitor, no m/r/g PULM: dyssynchronous on vent, lungs bilaterally diminished  GI: soft, bsx4 active  Extremities: warm/dry, trace edema  Skin: no rashes or lesions   Resolved Hospital Problem list   Epistaxis 2/19, Hemorrhoidal bleeding, Septic shock, Cardiogenic shock with acute RV failure, AKI from ATN 2nd to shock and hypoxia, Elevated LFTs from shock  Assessment & Plan:   Acute hypoxic, hypercapnic respiratory failure with ARDS from COVID 19 pneumonia. Initial infection from  09/03/19.  Ongoing infiltrates with elevated pulmonary pressures on vent / dyssynchrony. Late phase disease. Cultures negative.  -PRVC 6cc/kg, rate 36 -goal plateau pressure <30, driving pressure <40 cm H2O -target PaO2 55-65, titrate PEEP/FiO2 per ARDS protocol  -assess ABG now  -follow intermittent CXR  -continue solumedrol 60 mg IV Q6 (restarted 3/2), wean to off  -VAP prevention measures  Septic Shock (not present on admission). Suspect  secondary to HCAP.  -abx to complete 3/5  -levophed or MAP >65  Paroxysmal A fib, new onset. -continue amiodarone gtt, restarted 3/2  -continue apixaban   AKI  -Trend BMP / urinary output -Replace electrolytes as indicated -Avoid nephrotoxic agents, ensure adequate renal perfusion -acetazolamide BID x2 doses  -lasix 40 mg IV x1 with KCL   Hypernatremia -increase free water to 200 ml Q2 -reduce 1/2 NS to 43m/hr  Pulmonary Embolism. Noted on CT chest from 09/04/19 -continue apixaban   Hyperglycemia with prediabetes. -SSI, resistant scale  -increase TF coverage to 5 units -increase lantus to 14 units BID   Acute metabolic encephalopathy 2nd to COVID, sepsis, hypoxia. -RASS Goal -3 to -4  -increase ceiling of dilaudid gtt  -continue versed for sedation  -add klonopin, oxycodone PT in effort to reduce IV sedation   Anemia of Critical Illness. -follow CBC -transfuse for hgb <7% or active bleeding   Goals of care. Will update on arrival 3/5 Prior update 3/4 > ongoing discussions about comfort therapies / [place]  Anticipate poor prognosis    Best practice:  Diet: tube feeding DVT prophylaxis: eliquis GI prophylaxis: Pantoprazole Mobility: bed rest Code Status: DNR Disposition:  ICU  Labs:   CMP Latest Ref Rng & Units 3March 06, 20213/11/2019 10/12/2019  Glucose 70 - 99 mg/dL 212(H) 146(H) 134(H)  BUN 8 - 23 mg/dL 108(H) 109(H) 107(H)  Creatinine 0.61 - 1.24 mg/dL 1.45(H) 1.70(H) 1.61(H)  Sodium 135 - 145 mmol/L 147(H) 148(H) 149(H)  Potassium 3.5 - 5.1 mmol/L 4.4 4.2 4.5  Chloride 98 - 111 mmol/L 92(L) 92(L) 93(L)  CO2 22 - 32 mmol/L 46(H) 46(H) 45(H)  Calcium 8.9 - 10.3 mg/dL 8.3(L) 8.4(L) 8.2(L)  Total Protein 6.5 - 8.1 g/dL - - -  Total Bilirubin 0.3 - 1.2 mg/dL - - -  Alkaline Phos 38 - 126 U/L - - -  AST 15 - 41 U/L - - -  ALT 0 - 44 U/L - - -    CBC Latest Ref Rng & Units 306-Mar-20213/10/2019 10/10/2019  WBC 4.0 - 10.5 K/uL 8.9 - 10.5  Hemoglobin 13.0 - 17.0 g/dL  10.0(L) 10.5(L) 10.2(L)  Hematocrit 39.0 - 52.0 % 34.8(L) 31.0(L) 35.2(L)  Platelets 150 - 400 K/uL 200 - 218   ABG    Component Value Date/Time   PHART 7.375 10/11/2019 1134   PCO2ART 86.3 (HH) 10/11/2019 1134   PO2ART 56.0 (L) 10/11/2019 1134   HCO3 50.4 (H) 10/11/2019 1134   TCO2 >50 (H) 10/11/2019 1134   ACIDBASEDEF 7.0 (H) 09/26/2019 1136   O2SAT 85.0 10/11/2019 1134   CBG (last 3)  Recent Labs    0March 06, [id number]  GLUCAP 210* 185* 209*     CC Time: 35 minutes    BNoe Gens MSN, NP-C [facility] 303-01-2020 10:17 AM   Please see [email or web address] for pager details.

## 2019-11-09 NOTE — Death Summary Note (Signed)
DEATH SUMMARY   Patient Details  Name: Roy Lee MRN: [id number] DOB: 06/14/1956  Admission/Discharge Information   Admit Date:  10/04/2019  Date of Death: Date of Death: 05/09/20  Time of Death: Time of Death: 1807  Length of Stay: 21  Referring Physician: Clementeen GrahamScifres, Dorothy, PA-C   Reason(s) for Hospitalization  Acute hypoxemic respiratory failure secondary to COVID-19 pneumonia  Diagnoses  Preliminary cause of death:  Secondary Diagnoses (including complications and co-morbidities):  Active Problems:   Acute respiratory distress syndrome (ARDS) due to COVID-19 virus (HCC)   Acute respiratory failure with hypoxemia (HCC)   Healthcare-associated pneumonia   Acute pulmonary embolism (HCC)   Essential hypertension   Obesity, Class III, BMI 40-49.9 (morbid obesity) (HCC)   Atrial fibrillation with RVR (HCC)   Shortness of breath   Acute respiratory distress   Bradycardia   Hypothermia   Septic shock (HCC)   AKI (acute kidney injury) [facility](HCC)   Brief Hospital Course (including significant findings, care, treatment, and services provided and events leading to death)  Roy Lee is a 64 y.o. year old male previously hospitalized in 08/2019 COVID 19 pneumonia with PE who was readmitted for COVID-19 pneumonia requiring mechanical vnetilation. Hospital course as been complicated by refractory hypoxemic respiratory failure requiring prolonged mechanical ventilation, septic shock secondary to pneumonia, renal failure and atrial fibrillation with rapid ventricular response. After discussion with family, decision was made to transition to comfort care and withdraw life support.  Patient was compassionately extubated and expired on 10/12/2019 at 1807.   Pertinent Labs and Studies  Significant Diagnostic Studies DG Abd 1 View  Result Date: 09/26/2019 CLINICAL DATA:  Status post OG tube placement. EXAM: ABDOMEN - 1 VIEW COMPARISON:  None. FINDINGS: OG tube tip is seen in the distal  body of the stomach in good position. Bowel gas pattern is unremarkable. IMPRESSION: As above. Electronically Signed   By: Drusilla Kannerhomas  Dalessio M.D.   On: 09/26/2019 11:15   CT CHEST WO CONTRAST  Result Date: 09/26/2019 CLINICAL DATA:  Dyspnea, COVID-19 positive EXAM: CT CHEST WITHOUT CONTRAST TECHNIQUE: Multidetector CT imaging of the chest was performed following the standard protocol without IV contrast. COMPARISON:  CT 09/23/2019, x-ray 09/26/2019 FINDINGS: Cardiovascular: Heart size is normal. No pericardial effusion. Thoracic aorta is nonaneurysmal. Main pulmonary trunk is nondilated. Mediastinum/Nodes: Enlarged mediastinal lymph nodes are again noted, reference node including 15 mm pretracheal node (series 2, image 19), previously measured 14 mm. Fullness in the bilateral hilar regions likely reflecting hilar lymphadenopathy, incompletely characterized on this non contrasted study. Unremarkable thyroid and trachea. Small hiatal hernia. Lungs/Pleura: Significant interval worsening of diffuse airspace opacities throughout both lungs with both ground-glass opacity and more dense consolidation. Trace bilateral pleural effusions, left slightly greater than right. No pneumothorax. Upper Abdomen: No acute abnormality. Musculoskeletal: No chest wall mass or suspicious bone lesions identified. IMPRESSION: 1. Significant interval worsening of diffuse airspace opacities throughout both lungs, compatible with worsening multifocal pneumonia in the setting of known COVID-19 infection. 2. Trace bilateral pleural effusions, left slightly greater than right. 3. Mediastinal and hilar lymphadenopathy, likely reactive. Electronically Signed   By: Duanne GuessNicholas  Plundo D.O.   On: 09/26/2019 09:36   CT Angio Chest PE W and/or Wo Contrast  Result Date: 09/13/2019 CLINICAL DATA:  Shortness of breath, COVID-19 08/31/2019, hypoxia EXAM: CT ANGIOGRAPHY CHEST WITH CONTRAST TECHNIQUE: Multidetector CT imaging of the chest was performed  using the standard protocol during bolus administration of intravenous contrast. Multiplanar CT image reconstructions and MIPs were  obtained to evaluate the vascular anatomy. CONTRAST:  140mL OMNIPAQUE IOHEXOL 350 MG/ML SOLN COMPARISON:  09/16/2019 FINDINGS: Cardiovascular: This is a technically adequate evaluation of the pulmonary vasculature. No filling defects or pulmonary emboli. Heart is unremarkable without pericardial effusion. Thoracic aorta is normal in caliber. Mediastinum/Nodes: Borderline enlarged lymph nodes within the mediastinum are stable, likely reactive. The largest lymph node in the pretracheal region measures 14 mm in short axis reference image 47. Borderline enlarged bilateral hilar lymph nodes are unchanged. Lungs/Pleura: Since the previous exam, there are increasing areas of bilateral multifocal airspace disease consistent with progressive multifocal pneumonia. No effusion or pneumothorax. The central airways are patent. Upper Abdomen: No acute abnormality. Musculoskeletal: No acute or destructive bony lesions. Reconstructed images demonstrate no additional findings. Review of the MIP images confirms the above findings. IMPRESSION: 1. No evidence of pulmonary embolus. 2. Worsening bilateral airspace disease consistent with progressive multifocal pneumonia. Findings are consistent with known COVID-19. 3. Reactive mediastinal adenopathy. Electronically Signed   By: [name] M.D.   On: 29-Sep-2019 16:13   DG Chest Port 1 View  Result Date: 10/11/2019 CLINICAL DATA:  Respiratory failure EXAM: PORTABLE CHEST 1 VIEW COMPARISON:  10/10/2019 FINDINGS: Endotracheal tube terminates 3.5 cm above the carina. Multifocal interstitial/airspace opacities, favoring mild to moderate interstitial edema, improved. Improving pneumonia is also possible. Suspected small right pleural effusion. No pneumothorax. The heart is normal in size. Enteric tube courses into the stomach. IMPRESSION: Endotracheal tube  terminates 3.5 cm above the carina. Multifocal interstitial/airspace opacities, favoring mild-to-moderate interstitial edema, improved. Suspected small right pleural effusion. Electronically Signed   By: Julian Hy M.D.   On: 10/11/2019 08:51   DG Chest Port 1 View  Result Date: 10/10/2019 CLINICAL DATA:  Acute respiratory failure EXAM: PORTABLE CHEST 1 VIEW COMPARISON:  October 09, 2019 FINDINGS: The heart size and mediastinal contours are unchanged and are partially obscured. ET tube, PICC, and enteric tube are in unchanged position. Slight interval worsening in multifocal hazy airspace opacities throughout both lungs. No acute osseous abnormality. IMPRESSION: Slight interval worsening in the multifocal bilateral airspace opacities likely consistent with multifocal pneumonia and/or ARDS. Electronically Signed   By: Prudencio Pair M.D.   On: 10/10/2019 05:28   DG CHEST PORT 1 VIEW  Result Date: 10/09/2019 CLINICAL DATA:  COVID positive EXAM: PORTABLE CHEST 1 VIEW COMPARISON:  October 07, 2019 FINDINGS: The heart size and mediastinal contours are unchanged and partially obscured. ET tube is 1.8 cm above the level of the carina. Enteric tube is seen below the diaphragm. Multifocal patchy/hazy airspace opacities throughout both lungs without significant interval change. Tip of the right-sided PICC is not well seen, however likely within the right atrium. IMPRESSION: No significant interval change from prior exam. Electronically Signed   By: Prudencio Pair M.D.   On: 10/09/2019 05:38   DG CHEST PORT 1 VIEW  Result Date: 10/07/2019 CLINICAL DATA:  Ventilator dependence. EXAM: PORTABLE CHEST 1 VIEW COMPARISON:  Earlier same day FINDINGS: 0939 hours. Endotracheal tube tip is 3 cm above the base of the carina. A feeding tube passes into the stomach although the distal tip position is not included on the film. The tip of the right PICC line is not well demonstrated but appears to be over the lower right atrium.  Low lung volumes with stable prominence of the cardiopericardial silhouette. Diffuse bilateral airspace disease not substantially changed in the interval. IMPRESSION: 1. No substantial interval change in the diffuse bilateral airspace disease on this low volume  film. Electronically Signed   By: Kennith Center M.D.   On: 10/07/2019 10:17   DG Chest Port 1 View  Result Date: 10/07/2019 CLINICAL DATA:  Acute respiratory failure. EXAM: PORTABLE CHEST 1 VIEW COMPARISON:  Multiple recent previous exams. FINDINGS: 0458 hours. Endotracheal tube tip is 4.6 cm above the base of the carina. Right PICC line tip projects over the mid to lower right atrium. The diffuse bilateral airspace disease appears minimally improved in the interval with portions of the left hemidiaphragm now visible. Cardiopericardial silhouette is at upper limits of normal for size. The visualized bony structures of the thorax are intact. Telemetry leads overlie the chest. IMPRESSION: Low volume film with slight interval improvement in diffuse bilateral airspace disease. Electronically Signed   By: Kennith Center M.D.   On: 10/07/2019 05:27   DG Chest Port 1 View  Result Date: 10/06/2019 CLINICAL DATA:  Acute respiratory failure with hypoxemia. COVID-19 EXAM: PORTABLE CHEST 1 VIEW COMPARISON:  One-view chest x-ray 10/05/2019 FINDINGS: Endotracheal tube, NG tube, and right-sided PICC line are stable. Heart size is exaggerated by low lung volumes. Diffuse interstitial and airspace opacities have increased since yesterday. Effusions are again noted. IMPRESSION: 1. Increasing diffuse interstitial and airspace opacities. Compatible with COVID-19 pneumonia and ARDS. 2. bilateral pleural effusions. Electronically Signed   By: Marin Roberts M.D.   On: 10/06/2019 08:41   DG Chest Port 1 View  Result Date: 10/05/2019 CLINICAL DATA:  Acute respiratory failure with hypoxemia EXAM: PORTABLE CHEST 1 VIEW COMPARISON:  Portable exam 0549 hours compared to  10/04/2019 FINDINGS: Feeding tube extends into stomach. Tip of endotracheal tube projects 1.4 cm above carina. RIGHT arm PICC line with tip poorly visualized question projecting over RIGHT atrium. Stable heart size and mediastinal contours. Diffuse BILATERAL pulmonary infiltrates, minimally improved at LEFT base. No pleural effusion or pneumothorax. IMPRESSION: BILATERAL pulmonary infiltrates diffusely, slightly improved at LEFT base. RIGHT arm PICC line tip is poorly visualized, question projecting over RIGHT atrium. Electronically Signed   By: Ulyses Southward M.D.   On: 10/05/2019 08:58   DG Chest Port 1 View  Result Date: 10/04/2019 CLINICAL DATA:  Acute respiratory failure with hypoxemia EXAM: PORTABLE CHEST 1 VIEW COMPARISON:  Two days ago FINDINGS: Endotracheal tube tip at the clavicular heads. Left IJ line with tip in the left brachiocephalic vein region. The feeding tube at least reaches the stomach. Very low lung volumes with extensive airspace disease and cardiac enlargement. No evidence of air leak IMPRESSION: Stable hardware positioning and extensive pneumonia. Electronically Signed   By: Marnee Spring M.D.   On: 10/04/2019 08:53   DG Chest Port 1 View  Result Date: 10/02/2019 CLINICAL DATA:  Respiratory failure, COVID-19 positive EXAM: PORTABLE CHEST 1 VIEW COMPARISON:  Radiograph 10/01/2019 FINDINGS: Endotracheal tube in the mid trachea, 3.8 cm from the carina. A transesophageal tube tip is below the GE junction, poorly visualized in the upper abdomen. Left IJ catheter tip terminates at midline, likely within the brachiocephalic vein. Telemetry leads overlie the chest. Mixed interstitial and patchy airspace opacities in both lungs with atelectatic changes and low volumes. Improving opacity in the right lung base when compared to 1 day prior no pneumothorax. No visible effusion. Stable cardiomediastinal contours. No acute osseous or soft tissue abnormality. IMPRESSION: 1. Improving opacity in the  right lung base when compared to 1 day prior but with persistent widespread bilateral opacities. 2. Lines and tubes as above. Electronically Signed   By: Coralie Keens.D.  On: 10/02/2019 06:18   DG Chest Port 1 View  Result Date: 10/01/2019 CLINICAL DATA:  Acute respiratory failure with hypoxemia, COVID-19 positive EXAM: PORTABLE CHEST 1 VIEW COMPARISON:  Chest radiograph from one day prior. FINDINGS: Endotracheal tube tip is 3.1 cm above the carina. Weighted enteric tube tip seen in the distal stomach. Left internal jugular central venous catheter terminates in the left brachiocephalic vein. Stable cardiomediastinal silhouette with normal heart size. No pneumothorax. No pleural effusion. Extensive patchy opacities throughout both lungs, unchanged. IMPRESSION: 1. Well-positioned support structures. 2. Stable extensive patchy opacities throughout both lungs, compatible with COVID-19 pneumonia. Electronically Signed   By: Delbert Phenix M.D.   On: 10/01/2019 05:26   DG CHEST PORT 1 VIEW  Result Date: 09/30/2019 CLINICAL DATA:  Ventilator support.  Coronavirus pneumonia. EXAM: PORTABLE CHEST 1 VIEW COMPARISON:  09/29/2019 FINDINGS: Endotracheal tube tip 5 cm above the carina. Soft feeding tube enters the abdomen. Left internal jugular central line tip in the SVC at the azygos level. Widespread pulmonary opacification persists. No new finding. IMPRESSION: Lines and tubes satisfactory. Widespread pulmonary density persists. Electronically Signed   By: Paulina Fusi M.D.   On: 09/30/2019 05:50   DG CHEST PORT 1 VIEW  Result Date: 09/29/2019 CLINICAL DATA:  Acute hypoxemic respiratory failure. EXAM: PORTABLE CHEST 1 VIEW COMPARISON:  Radiograph and CT 09/26/2019 FINDINGS: Endotracheal tube tip is at the clavicular heads, approximately 5.6 cm from the carina. Enteric tube in place with tip below the diaphragm included in the field of view. Left internal jugular central venous catheter tip projects over the upper  SVC. Severe diffuse bilateral pulmonary opacities with slight worsening in the right lung from prior. Cardiomediastinal contours are unchanged but grossly obscured. No evidence of pneumothorax. Pleural effusions on CT not well visualized radiographically. IMPRESSION: 1. Severe diffuse bilateral pulmonary opacities with slight worsening in the right lung from prior. 2. Borderline high positioning of the endotracheal tube 5.6 cm from the carina. Additional support apparatus is unchanged. Electronically Signed   By: Narda Rutherford M.D.   On: 09/29/2019 06:08   DG CHEST PORT 1 VIEW  Result Date: 09/26/2019 CLINICAL DATA:  Status post intubation and central line placement. EXAM: PORTABLE CHEST 1 VIEW COMPARISON:  Single-view of the chest earlier today. FINDINGS: New endotracheal tube is in place with the tip in good position 3.5 cm above the carina. New left IJ central venous catheter tip projects in the mid superior vena cava. OG tube tip and side-port in the stomach. No pneumothorax. Diffuse bilateral airspace disease persists. Heart size is normal. IMPRESSION: ET tube, OG tube and left IJ central venous catheter project in good position. No pneumothorax. No change in diffuse bilateral airspace disease. Electronically Signed   By: Drusilla Kanner M.D.   On: 09/26/2019 11:18   DG CHEST PORT 1 VIEW  Result Date: 09/26/2019 CLINICAL DATA:  Acute respiratory distress EXAM: PORTABLE CHEST 1 VIEW COMPARISON:  09/14/2019 FINDINGS: Severe diffuse bilateral airspace disease, left greater than right, worsening since prior study. Heart is likely normal size. Low lung volumes. No visible effusions or acute bony abnormality. IMPRESSION: Severe bilateral airspace disease, worsening since prior study. Electronically Signed   By: Charlett Nose M.D.   On: 09/26/2019 08:34   DG Chest Portable 1 View  Result Date: 09/29/2019 CLINICAL DATA:  Shortness of breath. COVID-19 pneumonia. Increased cough. Decreased O2 saturation.  EXAM: PORTABLE CHEST 1 VIEW COMPARISON:  CT scan and chest x-ray dated 09/16/2019 and chest x-ray dated 09/03/2019  FINDINGS: There is been slight progression of the peripheral infiltrate in the left upper lobe. The infiltrates on the right and the infiltrates at the left base are unchanged. Heart size and pulmonary vascularity remain normal. No discrete effusions. IMPRESSION: Persistent bilateral pulmonary infiltrates, progressed in the left upper lobe. Electronically Signed   By: Francene Boyers M.D.   On: 10/04/2019 12:44   ECHOCARDIOGRAM COMPLETE  Result Date: 09/27/2019    ECHOCARDIOGRAM REPORT   Patient Name:   TAEDYN GLASSCOCK Date of Exam: 09/27/2019 Medical Rec #:  [id number]        Height:       71.0 in Accession #:    [id number]       Weight:       259.9 lb Date of Birth:  [birth year]         BSA:          2.36 m Patient Age:    63 years         BP:           105/84 mmHg Patient Gender: M                HR:           94 bpm. Exam Location:  Inpatient Procedure: 2D Echo Indications:    acute respiratory insufficiency 518.82  History:        Patient has no prior history of Echocardiogram examinations.                 Risk Factors:Hypertension.  Sonographer:    Celene Skeen RDCS (AE) Referring Phys: [id number] PETER E BABCOCK  Sonographer Comments: Patient is morbidly obese. extremely limited mobility IMPRESSIONS  1. Technically difficult study. Normal LVEF without clear wall motion abnormalities, but limited sensitivity based on image quality. RV visually appears severely enlarged with reduced function, elevated right sided pressures.  2. Left ventricular ejection fraction, by estimation, is 55 to 60%. The left ventricle has normal function. The left ventricle has no regional wall motion abnormalities. Left ventricular diastolic function could not be evaluated. There is the interventricular septum is flattened in systole and diastole, consistent with right ventricular pressure and volume overload.  3. Right  ventricular systolic function is severely reduced. The right ventricular size is severely enlarged. There is moderately elevated pulmonary artery systolic pressure.  4. The mitral valve is grossly normal. Trivial mitral valve regurgitation. No evidence of mitral stenosis.  5. The aortic valve is normal in structure and function. Aortic valve regurgitation is not visualized. No aortic stenosis is present. FINDINGS  Left Ventricle: Left ventricular ejection fraction, by estimation, is 55 to 60%. The left ventricle has normal function. The left ventricle has no regional wall motion abnormalities. The left ventricular internal cavity size was normal in size. There is  no left ventricular hypertrophy. The interventricular septum is flattened in systole and diastole, consistent with right ventricular pressure and volume overload. Left ventricular diastolic function could not be evaluated. Right Ventricle: The right ventricular size is severely enlarged. No increase in right ventricular wall thickness. Right ventricular systolic function is severely reduced. There is moderately elevated pulmonary artery systolic pressure. The tricuspid regurgitant velocity is 3.03 m/s, and with an assumed right atrial pressure of 8 mmHg, the estimated right ventricular systolic pressure is 44.7 mmHg. Left Atrium: Left atrial size was not well visualized. Right Atrium: Right atrial size was not well visualized. Pericardium: There is no evidence of pericardial effusion. Mitral Valve: The mitral valve  is grossly normal. Trivial mitral valve regurgitation. No evidence of mitral valve stenosis. Tricuspid Valve: The tricuspid valve is normal in structure. Tricuspid valve regurgitation is mild. Aortic Valve: The aortic valve is normal in structure and function. Aortic valve regurgitation is not visualized. No aortic stenosis is present. Pulmonic Valve: The pulmonic valve was not well visualized. Pulmonic valve regurgitation is not visualized. No  evidence of pulmonic stenosis. Aorta: The aortic root is normal in size and structure. IAS/Shunts: The interatrial septum was not well visualized.  LEFT VENTRICLE PLAX 2D LVOT diam:     2.20 cm LV SV:         41.43 ml LVOT Area:     3.80 cm  AORTIC VALVE LVOT Vmax:   76.90 cm/s LVOT Vmean:  57.400 cm/s LVOT VTI:    0.109 m MITRAL VALVE               TRICUSPID VALVE MV Area (PHT): 2.60 cm    TR Peak grad:   36.7 mmHg MV Decel Time: 292 msec    TR Vmax:        303.00 cm/s MV E velocity: 57.20 cm/s                            SHUNTS                            Systemic VTI:  0.11 m                            Systemic Diam: 2.20 cm Jodelle Red MD Electronically signed by Jodelle Red MD Signature Date/Time: 09/27/2019/5:04:59 PM    Final    [place] EKG SITE RITE  Result Date: 10/04/2019 If Site Rite image not attached, placement could not be confirmed due to current cardiac rhythm.   Microbiology Recent Results (from the past 240 hour(s))  Culture, blood (routine x 2)     Status: None   Collection Time: 10/07/19 12:46 AM   Specimen: BLOOD  Result Value Ref Range Status   Specimen Description   Final    BLOOD LEFT HAND Performed at [facility], [address] [place], [place] [address]    Special Requests   Final    BOTTLES DRAWN AEROBIC ONLY Blood Culture results may not be optimal due to an inadequate volume of blood received in culture bottles Performed at [facility], [address] [place], [place] [address]    Culture   Final    NO GROWTH 5 DAYS Performed at [facility], [address], [place], [place] [address]    Report Status 10/12/2019 FINAL  Final  Culture, blood (routine x 2)     Status: None   Collection Time: 10/07/19 12:51 AM   Specimen: BLOOD  Result Value Ref Range Status   Specimen Description   Final    BLOOD LEFT ASSIST CONTROL Performed at [facility], [address] [place],  [place] [address]    Special Requests   Final    BOTTLES DRAWN AEROBIC ONLY Blood Culture results may not be optimal due to an inadequate volume of blood received in culture bottles Performed at [facility], [address], [place], [place] [address]    Culture   Final    NO GROWTH 5 DAYS Performed at  [facility], [address] [place], [place] [address]    Report Status 10/12/2019 FINAL  Final  Culture, respiratory (non-expectorated)     Status: None   Collection Time: 10/07/19 12:18 PM   Specimen: Tracheal Aspirate; Respiratory  Result Value Ref Range Status   Specimen Description   Final    TRACHEAL ASPIRATE Performed at [facility], [address], [place], [place] [address]    Special Requests   Final    NONE Performed at [facility], [address], [place], [place] [address]    Gram Stain   Final    FEW GRAM POSITIVE COCCI RARE GRAM NEGATIVE RODS ABUNDANT WBC PRESENT, PREDOMINANTLY PMN    Culture   Final    Consistent with normal respiratory flora. Performed at [facility], [address] [place], [place] [address]    Report Status 10/09/2019 FINAL  Final  MRSA PCR Screening     Status: None   Collection Time: 10/08/19 10:33 AM   Specimen: Nasopharyngeal  Result Value Ref Range Status   MRSA by PCR NEGATIVE NEGATIVE Final    Comment:        The GeneXpert MRSA Assay (FDA approved for NASAL specimens only), is one component of a comprehensive MRSA colonization surveillance program. It is not intended to diagnose MRSA infection nor to guide or monitor treatment for MRSA infections. Performed at [facility], [address] [place], [place] [address]     Lab Basic Metabolic Panel: Recent Labs  Lab 10/10/19 [phone] 10/11/19 1134 10/12/19 0500 10/12/19 1345 10/15/2019 0337  NA 148* 147* 149* 148* 147*  K 4.3 4.5 4.5 4.2 4.4  CL 93*  --  93* 92* 92*  CO2 44*  --  45* 46*  46*  GLUCOSE 102*  --  134* 146* 212*  BUN 75*  --  107* 109* 108*  CREATININE 1.13  --  1.61* 1.70* 1.45*  CALCIUM 7.9*  --  8.2* 8.4* 8.3*  MG 2.4  --   --   --   --   PHOS 5.1*  --   --   --   --    Liver Function Tests: No results for input(s): AST, ALT, ALKPHOS, BILITOT, PROT, ALBUMIN in the last 168 hours. No results for input(s): LIPASE, AMYLASE in the last 168 hours. No results for input(s): AMMONIA in the last 168 hours. CBC: Recent Labs  Lab 10/10/19 0442 10/11/19 1134 10/15/2019 0337  WBC 10.5  --  8.9  HGB 10.2* 10.5* 10.0*  HCT 35.2* 31.0* 34.8*  MCV 101.7*  --  100.6*  PLT 218  --  200   Cardiac Enzymes: No results for input(s): CKTOTAL, CKMB, CKMBINDEX, TROPONINI in the last 168 hours. Sepsis Labs: Recent Labs  Lab 10/10/19 0442 10-15-19 0337  WBC 10.5 8.9    Procedures/Operations  L IJ CVL 2/16 ETT 2/16  PICC 2/24    Donnah Levert Mechele Collin 10/16/2019, 8:05 PM

## 2019-11-09 NOTE — Progress Notes (Signed)
Nutrition Follow-up  DOCUMENTATION CODES:   Obesity unspecified  INTERVENTION:   Continue TF via Cortrak, change goal rate to better meet re-estimated needs: -Vital AF 1.2 at 65 ml/h (1560 ml per day) -Add Pro-stat 30 ml TID  Provides 2172 kcal, 162 gm protein, 1265 ml free water daily  Free water flushes 200 ml every 2 hours for 3665 ml total free water intake per day.  NUTRITION DIAGNOSIS:   Increased nutrient needs related to acute illness, catabolic illness(COVID 19) as evidenced by estimated needs.  Ongoing  GOAL:   Patient will meet greater than or equal to 90% of their needs   Met with TF  MONITOR:   TF tolerance, Vent status, Labs  REASON FOR ASSESSMENT:   Ventilator, Consult Enteral/tube feeding initiation and management  ASSESSMENT:   64 yo male admitted 2/12 with worsening dyspnea and hypoxemia. Required intubation on 2/16. PMH includes COVID-19 PNA & PE in January 2021, obesity, HTN, arthritis.   Patient is no longer requiring isolation precautions for COVID. He was moved out of the COVID unit on 3/4.   Cortrak placed 2/17, tip in stomach.  TF order: Vital AF 1.2 at 90 ml/h; free water flushes 200 ml every 2 hours. Notified by RN that patient has had a few episodes of emesis. TF currently on hold.   MD is discussing possible transition to comfort care if patient continues to decline. No improvements over the past few days, poor prognosis.   Patient remains intubated on ventilator support MV: 15.5 L/min Temp (24hrs), Avg:97.3 F (36.3 C), Min:96.5 F (35.8 C), Max:98 F (36.7 C)   Labs reviewed. Na 147 (H), BUN 108 (H), Creat 1.45 (H) CBG's: 210-185-209 Medications reviewed and include novolog, lantus, solu-medrol, miralax. Levophed stopped this morning.    I/O +29.7 L since admission Weight 147.8 kg today; 117.9 kg on admission  Diet Order:   Diet Order            Diet NPO time specified  Diet effective now              EDUCATION  NEEDS:   Not appropriate for education at this time  Skin:  Skin Assessment: Skin Integrity Issues: Skin Integrity Issues:: DTI DTI: L great toe, sacrum, lip  Last BM:  3/4 type 7  Height:   Ht Readings from Last 1 Encounters:  10/07/19 '5\' 11"'  (1.803 m)    Weight:   Wt Readings from Last 1 Encounters:  10-26-19 (!) 147.8 kg   Admission weight 117.9 kg  Ideal Body Weight:  78.2 kg  Estimated Nutritional Needs:   Kcal:  2200-2400  Protein:  >/= 156 gm  Fluid:  >/= 2.5 L    Molli Barrows, RD, LDN, CNSC Please refer to Amion for contact information.
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
## 2019-11-09 NOTE — Progress Notes (Signed)
Patient was compassionately extubated with RN & family at the bedside at 17:25.

## 2019-11-09 NOTE — Progress Notes (Signed)
eLink Physician-Brief Progress Note Patient Name: Roy Lee DOB: [birth year] MRN: [id number]   Date of Service  11-12-19  HPI/Events of Note  Informed by RN of brief episode of bradycardia in 40s-50s (Afib) while on amiodarone drip at 0.5mg /min, then patient converted to SR in 60s which remains his rhythm at present time.  RN also requests AM labs be ordered.   eICU Interventions  Ordered CBC + BMP.  No action required at this time for the brief episode of slow Afib that the patient had.      Intervention Category Intermediate Interventions: Arrhythmia - evaluation and management Minor Interventions: Other:  Janae Bridgeman Nov 12, 2019, 12:55 AM

## 2019-11-09 NOTE — Progress Notes (Signed)
37ml versed gtt wasted, as well as 75 ml of dilaudid gtt. Meds wasted and witnessed by Jacobo Forest, RN and this RN.

## 2019-11-09 DEATH — deceased

## 2020-01-10 ENCOUNTER — Other Ambulatory Visit: Payer: Self-pay

## 2021-10-13 IMAGING — DX DG CHEST 1V PORT
1 series · 1 of 1 positions shown · non-contrast
Comparison: Prior chest x-ray 09/03/2019

CLINICAL DATA: 63-year-old female with PIHX6-RA and shortness of
breath. She was admitted to [REDACTED] from [DATE] through
[DATE] of 0208.

EXAM:
PORTABLE CHEST 1 VIEW

[chest ap]
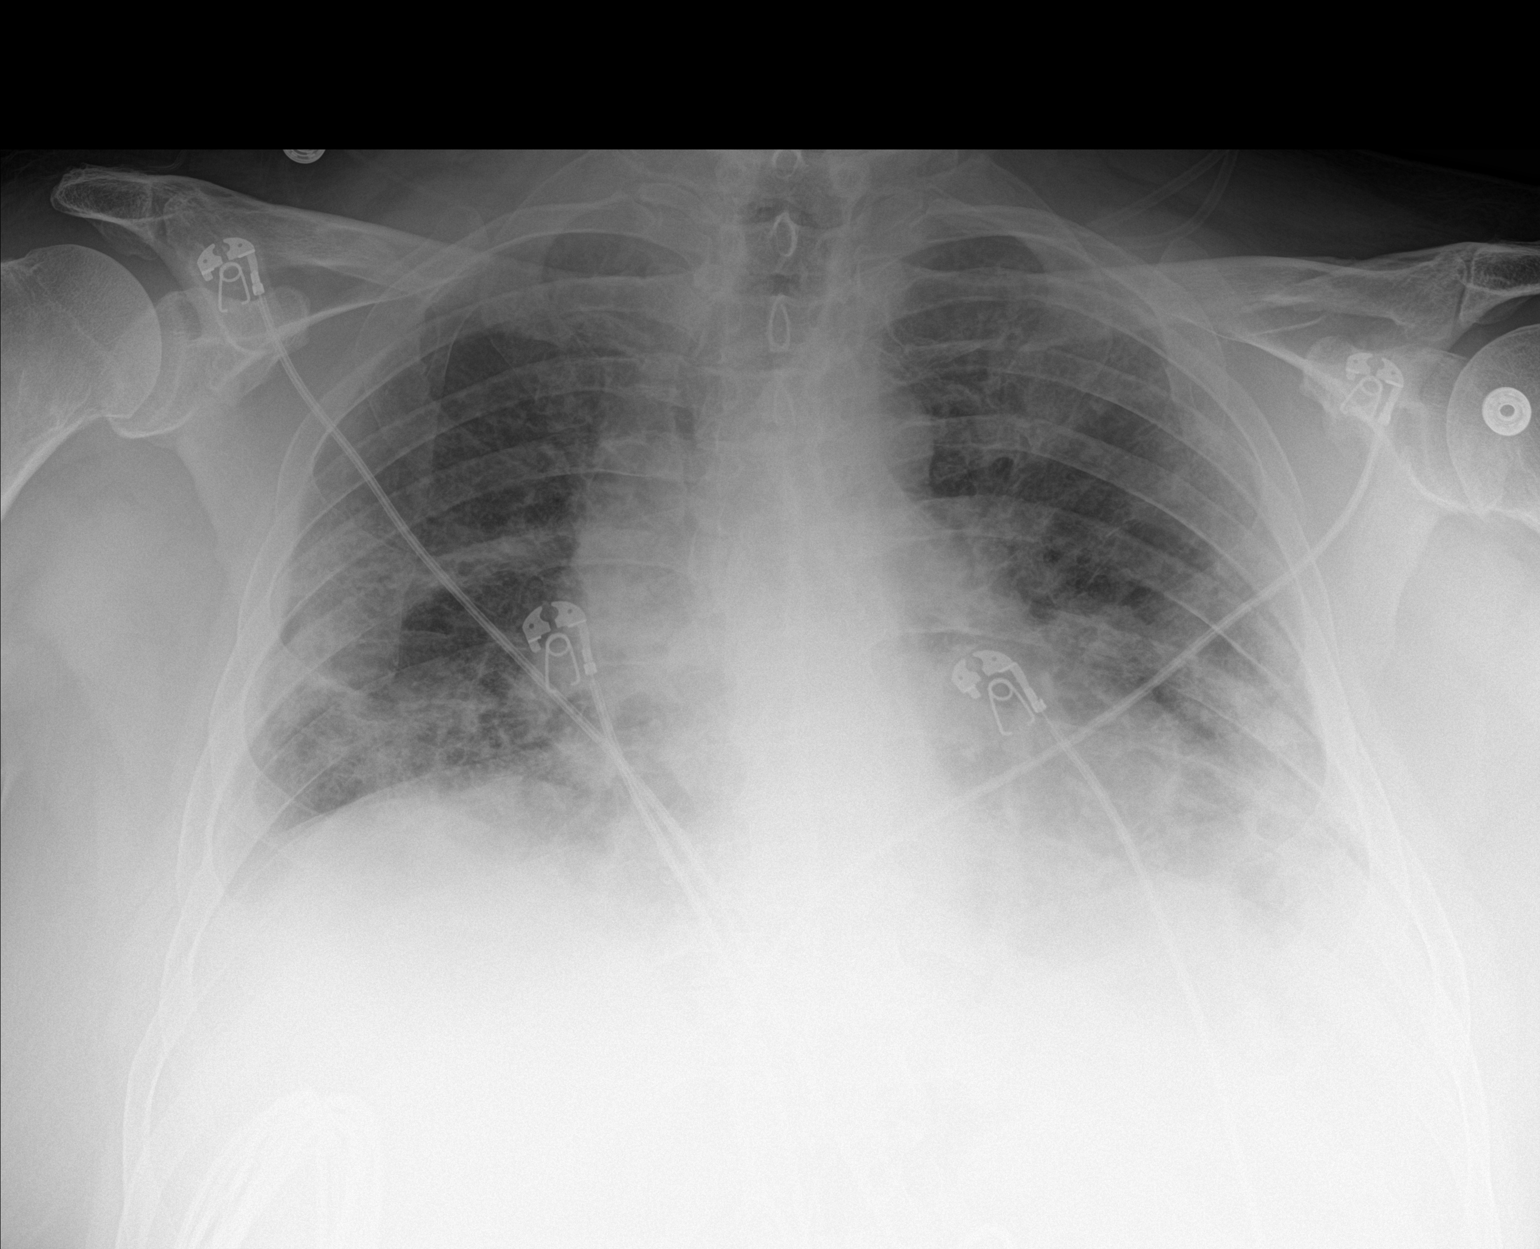

[1 of 1 positions shown; findings below may reference images not displayed]

FINDINGS: Similar appearance of the lungs. Persistent low inspiratory volumes
with patchy areas of predominantly interstitial opacities in both
lower lobes. Stable cardiac and mediastinal contours. Background
bronchitic changes remain stable. No pleural effusion or
pneumothorax. No acute osseous abnormality.
IMPRESSION: Similar appearance of the lungs compared to 09/03/2018 with
persistent bilateral peripheral and lower lobe predominant
interstitial airspace opacities. Findings may represent persistent
multifocal COVID pneumonia versus areas of residual
pleuroparenchymal scarring.

No new cardiopulmonary process identified.

## 2021-11-02 IMAGING — DX DG CHEST 1V PORT
1 series · 1 of 1 positions shown · non-contrast
Comparison: One-view chest x-ray 10/05/2019

CLINICAL DATA: Acute respiratory failure with hypoxemia. [id number]

EXAM:
PORTABLE CHEST 1 VIEW

[chest ap]
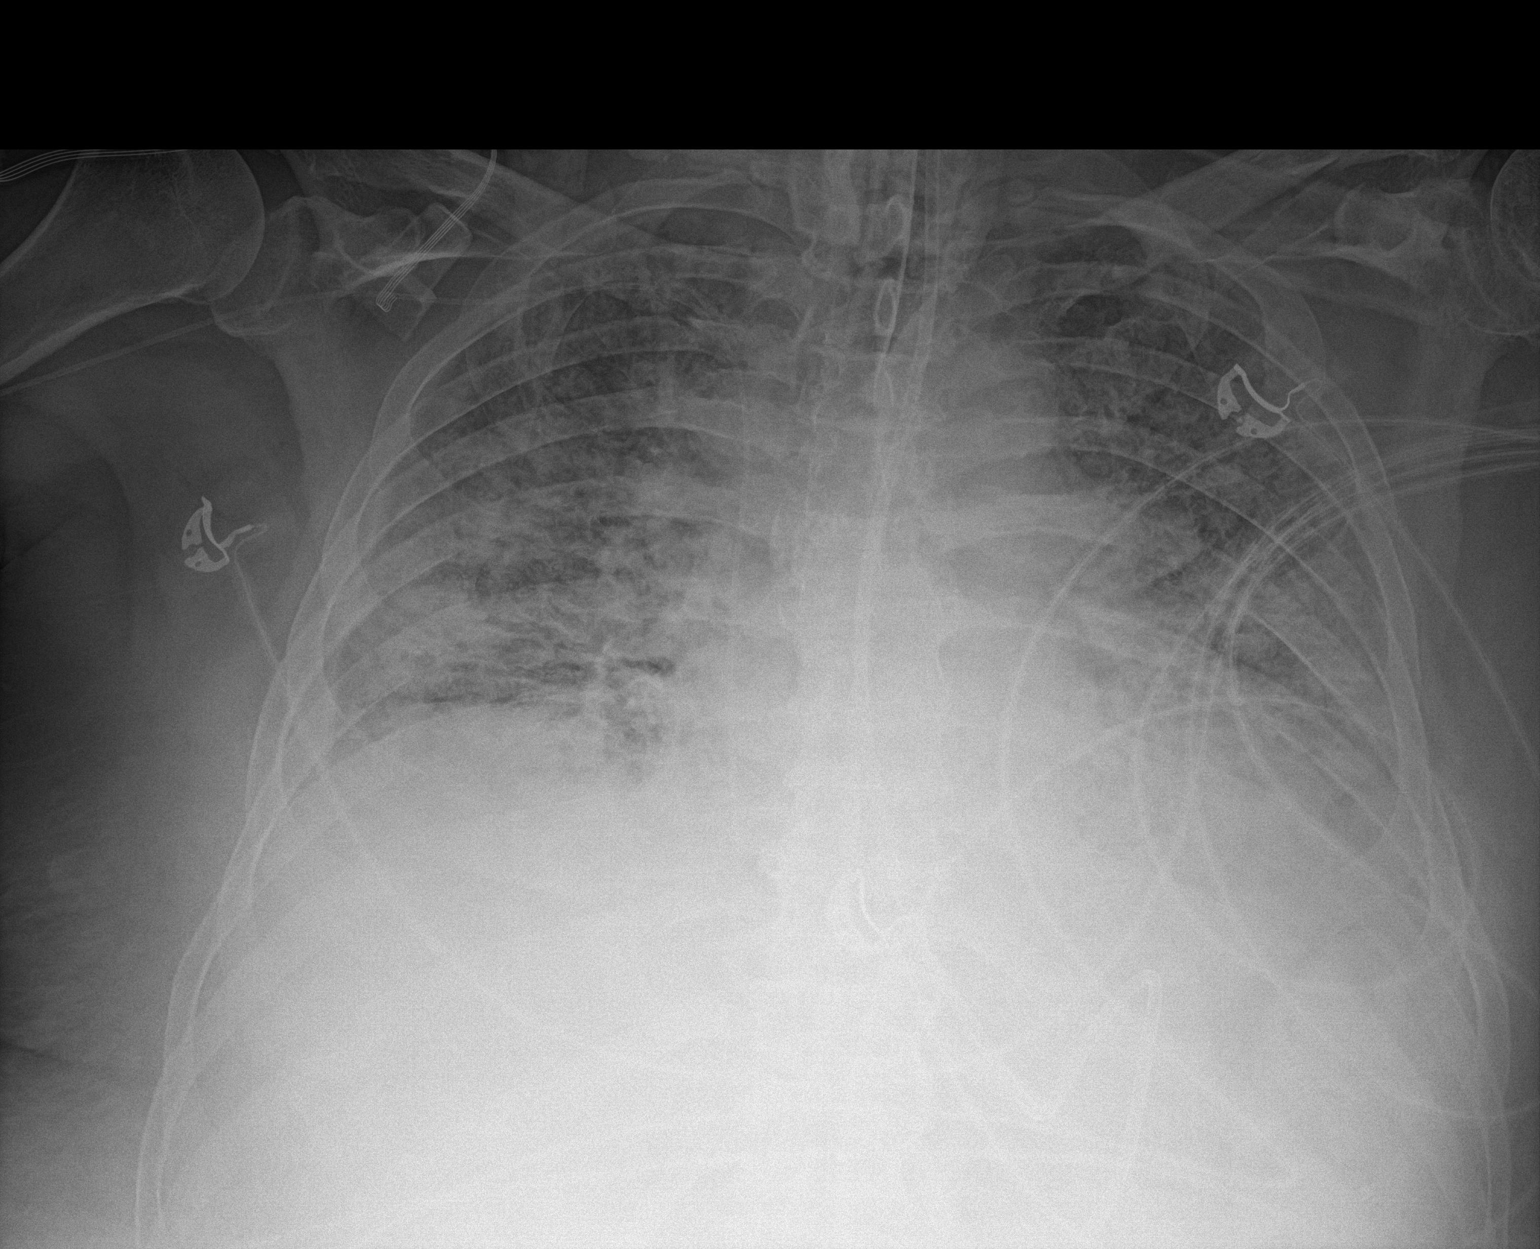

[1 of 1 positions shown; findings below may reference images not displayed]

FINDINGS: Endotracheal tube, NG tube, and right-sided PICC line are stable.
Heart size is exaggerated by low lung volumes. Diffuse interstitial
and airspace opacities have increased since yesterday. Effusions are
again noted.
IMPRESSION: 1. Increasing diffuse interstitial and airspace opacities.
Compatible with [id number] pneumonia and ARDS.
2. bilateral pleural effusions.
